# Patient Record
Sex: Female | Born: 1990 | Race: Black or African American | Hispanic: No | Marital: Single | Smoking: Former smoker
Health system: Southern US, Community
[De-identification: ages and names within clinical notes are randomized; demographics above are authoritative.]

## PROBLEM LIST (undated history)

## (undated) ENCOUNTER — Inpatient Hospital Stay: Payer: Self-pay

## (undated) ENCOUNTER — Inpatient Hospital Stay (HOSPITAL_COMMUNITY): Payer: Self-pay

## (undated) DIAGNOSIS — O039 Complete or unspecified spontaneous abortion without complication: Secondary | ICD-10-CM

## (undated) DIAGNOSIS — Z789 Other specified health status: Secondary | ICD-10-CM

## (undated) HISTORY — PX: DILATATION & CURETTAGE/HYSTEROSCOPY WITH MYOSURE: SHX6511

## (undated) HISTORY — DX: Other specified health status: Z78.9

---
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
## 2010-07-14 ENCOUNTER — Emergency Department: Payer: Self-pay | Admitting: Emergency Medicine

## 2010-08-20 ENCOUNTER — Observation Stay: Payer: Self-pay | Admitting: Obstetrics and Gynecology

## 2010-09-14 ENCOUNTER — Inpatient Hospital Stay: Payer: Self-pay | Admitting: Obstetrics and Gynecology

## 2010-09-17 ENCOUNTER — Observation Stay: Payer: Self-pay

## 2010-10-08 ENCOUNTER — Observation Stay: Payer: Self-pay | Admitting: Obstetrics & Gynecology

## 2010-10-16 ENCOUNTER — Observation Stay: Payer: Self-pay

## 2010-10-19 ENCOUNTER — Inpatient Hospital Stay: Payer: Self-pay

## 2011-11-14 ENCOUNTER — Emergency Department: Payer: Self-pay | Admitting: *Deleted

## 2011-11-14 LAB — URINALYSIS, COMPLETE
Ketone: NEGATIVE
Nitrite: POSITIVE
Ph: 6 (ref 4.5–8.0)
Specific Gravity: 1.027 (ref 1.003–1.030)
Squamous Epithelial: 2
WBC UR: 15 /HPF (ref 0–5)

## 2011-11-14 LAB — COMPREHENSIVE METABOLIC PANEL
Albumin: 4.5 g/dL (ref 3.4–5.0)
Alkaline Phosphatase: 58 U/L (ref 50–136)
Calcium, Total: 9.6 mg/dL (ref 8.5–10.1)
Co2: 23 mmol/L (ref 21–32)
Creatinine: 0.9 mg/dL (ref 0.60–1.30)
EGFR (Non-African Amer.): >60
Glucose: 83 mg/dL (ref 65–99)
SGOT(AST): 19 U/L (ref 15–37)
SGPT (ALT): 21 U/L

## 2011-11-14 LAB — CBC
HGB: 12.8 g/dL (ref 12.0–16.0)
MCH: 30 pg (ref 26.0–34.0)
RBC: 4.28 10*6/uL (ref 3.80–5.20)
WBC: 5.6 10*3/uL (ref 3.6–11.0)

## 2011-11-14 LAB — LIPASE, BLOOD: Lipase: 101 U/L (ref 73–393)

## 2011-11-16 LAB — URINE CULTURE

## 2011-12-20 ENCOUNTER — Emergency Department: Payer: Self-pay | Admitting: Internal Medicine

## 2011-12-22 ENCOUNTER — Emergency Department: Payer: Self-pay | Admitting: Emergency Medicine

## 2011-12-22 LAB — URINALYSIS, COMPLETE
Bilirubin,UR: NEGATIVE
Leukocyte Esterase: NEGATIVE
Ph: 6 (ref 4.5–8.0)
Protein: NEGATIVE
RBC,UR: 1 /HPF (ref 0–5)
WBC UR: 4 /HPF (ref 0–5)

## 2011-12-22 LAB — BASIC METABOLIC PANEL
Calcium, Total: 9.2 mg/dL (ref 8.5–10.1)
Co2: 25 mmol/L (ref 21–32)
Creatinine: 0.77 mg/dL (ref 0.60–1.30)
Potassium: 3.8 mmol/L (ref 3.5–5.1)
Sodium: 141 mmol/L (ref 136–145)

## 2011-12-22 LAB — CBC WITH DIFFERENTIAL/PLATELET
Basophil %: 0.4 %
Eosinophil #: 0.1 10*3/uL (ref 0.0–0.7)
Eosinophil %: 2.1 %
HCT: 39.7 % (ref 35.0–47.0)
HGB: 13.4 g/dL (ref 12.0–16.0)
Lymphocyte #: 2.3 10*3/uL (ref 1.0–3.6)
Lymphocyte %: 48.8 %
MCHC: 33.9 g/dL (ref 32.0–36.0)
Monocyte %: 5.6 %
Neutrophil #: 2 10*3/uL (ref 1.4–6.5)
Neutrophil %: 43.1 %
RBC: 4.49 10*6/uL (ref 3.80–5.20)
WBC: 4.7 10*3/uL (ref 3.6–11.0)

## 2011-12-22 LAB — WET PREP, GENITAL

## 2012-04-14 ENCOUNTER — Observation Stay: Payer: Self-pay | Admitting: Obstetrics and Gynecology

## 2012-04-14 LAB — CBC WITH DIFFERENTIAL/PLATELET
Basophil #: 0 10*3/uL (ref 0.0–0.1)
Eosinophil #: 0.3 10*3/uL (ref 0.0–0.7)
HCT: 31.6 % — ABNORMAL LOW (ref 35.0–47.0)
Lymphocyte %: 33.3 %
MCH: 27.7 pg (ref 26.0–34.0)
MCHC: 33.8 g/dL (ref 32.0–36.0)
Monocyte %: 6.9 %
Neutrophil #: 3.4 10*3/uL (ref 1.4–6.5)
Neutrophil %: 54.9 %
RDW: 14.8 % — ABNORMAL HIGH (ref 11.5–14.5)
WBC: 6.2 10*3/uL (ref 3.6–11.0)

## 2012-04-14 LAB — HCG, QUANTITATIVE, PREGNANCY: Beta Hcg, Quant.: <1 m[IU]/mL — ABNORMAL LOW

## 2012-07-20 ENCOUNTER — Emergency Department: Payer: Self-pay | Admitting: Emergency Medicine

## 2012-07-20 LAB — URINALYSIS, COMPLETE
Bilirubin,UR: NEGATIVE
Glucose,UR: NEGATIVE mg/dL
Ketone: NEGATIVE
Nitrite: NEGATIVE
Ph: 8
Protein: NEGATIVE
RBC,UR: 7 /HPF
Specific Gravity: 1.023
Squamous Epithelial: 5
WBC UR: 36 /HPF

## 2012-07-20 LAB — COMPREHENSIVE METABOLIC PANEL
Alkaline Phosphatase: 93 U/L (ref 50–136)
Anion Gap: 11 (ref 7–16)
Bilirubin,Total: 0.6 mg/dL (ref 0.2–1.0)
Calcium, Total: 9.7 mg/dL (ref 8.5–10.1)
Chloride: 107 mmol/L (ref 98–107)
Co2: 25 mmol/L (ref 21–32)
Creatinine: 0.76 mg/dL (ref 0.60–1.30)
EGFR (African American): >60
EGFR (Non-African Amer.): >60
Osmolality: 287 (ref 275–301)
SGPT (ALT): 17 U/L (ref 12–78)
Sodium: 143 mmol/L (ref 136–145)
Total Protein: 8.7 g/dL — ABNORMAL HIGH (ref 6.4–8.2)

## 2012-07-21 ENCOUNTER — Emergency Department: Payer: Self-pay | Admitting: Emergency Medicine

## 2012-07-21 LAB — COMPREHENSIVE METABOLIC PANEL
Albumin: 4.2 g/dL (ref 3.4–5.0)
Alkaline Phosphatase: 84 U/L (ref 50–136)
BUN: 15 mg/dL (ref 7–18)
Bilirubin,Total: 0.7 mg/dL (ref 0.2–1.0)
Creatinine: 0.88 mg/dL (ref 0.60–1.30)
EGFR (Non-African Amer.): >60
Glucose: 98 mg/dL (ref 65–99)
Osmolality: 280 (ref 275–301)
SGPT (ALT): 18 U/L (ref 12–78)
Sodium: 140 mmol/L (ref 136–145)
Total Protein: 8.5 g/dL — ABNORMAL HIGH (ref 6.4–8.2)

## 2012-07-21 LAB — CBC
HCT: 34.7 % — ABNORMAL LOW (ref 35.0–47.0)
HGB: 11.7 g/dL — ABNORMAL LOW (ref 12.0–16.0)
MCH: 25.4 pg — ABNORMAL LOW (ref 26.0–34.0)
MCHC: 33.7 g/dL (ref 32.0–36.0)
MCV: 75 fL — ABNORMAL LOW (ref 80–100)
Platelet: 194 10*3/uL (ref 150–440)
RDW: 19.7 % — ABNORMAL HIGH (ref 11.5–14.5)
WBC: 6.1 10*3/uL (ref 3.6–11.0)

## 2012-10-15 ENCOUNTER — Inpatient Hospital Stay: Payer: Self-pay | Admitting: Internal Medicine

## 2012-10-15 LAB — COMPREHENSIVE METABOLIC PANEL
Albumin: 4.6 g/dL (ref 3.4–5.0)
Alkaline Phosphatase: 111 U/L (ref 50–136)
BUN: 15 mg/dL (ref 7–18)
Bilirubin,Total: 0.7 mg/dL (ref 0.2–1.0)
Chloride: 105 mmol/L (ref 98–107)
Co2: 25 mmol/L (ref 21–32)
EGFR (Non-African Amer.): >60
Glucose: 91 mg/dL (ref 65–99)
Potassium: 3.7 mmol/L (ref 3.5–5.1)
SGOT(AST): 17 U/L (ref 15–37)
SGPT (ALT): 20 U/L (ref 12–78)

## 2012-10-15 LAB — CBC
MCH: 27.7 pg (ref 26.0–34.0)
MCHC: 32.3 g/dL (ref 32.0–36.0)
MCV: 86 fL (ref 80–100)
RBC: 4.86 10*6/uL (ref 3.80–5.20)
RDW: 14.8 % — ABNORMAL HIGH (ref 11.5–14.5)

## 2012-10-15 LAB — URINALYSIS, COMPLETE
Bilirubin,UR: NEGATIVE
Ketone: NEGATIVE
Nitrite: NEGATIVE
Ph: 5 (ref 4.5–8.0)
Specific Gravity: 1.029 (ref 1.003–1.030)
WBC UR: 10 /HPF (ref 0–5)

## 2012-10-15 LAB — WET PREP, GENITAL

## 2012-10-16 LAB — CBC WITH DIFFERENTIAL/PLATELET
HCT: 29.7 % — ABNORMAL LOW (ref 35.0–47.0)
HGB: 9.9 g/dL — ABNORMAL LOW (ref 12.0–16.0)
MCH: 28.9 pg (ref 26.0–34.0)
MCHC: 33.4 g/dL (ref 32.0–36.0)
MCV: 86 fL (ref 80–100)
Neutrophil %: 23.1 %
RDW: 14.3 % (ref 11.5–14.5)

## 2012-10-16 LAB — URINE CULTURE

## 2012-10-16 LAB — BASIC METABOLIC PANEL
Anion Gap: 6 — ABNORMAL LOW (ref 7–16)
Chloride: 110 mmol/L — ABNORMAL HIGH (ref 98–107)
Co2: 24 mmol/L (ref 21–32)
EGFR (Non-African Amer.): >60
Osmolality: 278 (ref 275–301)
Sodium: 140 mmol/L (ref 136–145)

## 2012-10-16 LAB — MAGNESIUM: Magnesium: 1.7 mg/dL — ABNORMAL LOW

## 2012-10-17 LAB — CBC WITH DIFFERENTIAL/PLATELET
Basophil #: 0 10*3/uL (ref 0.0–0.1)
Basophil %: 0.3 %
Eosinophil #: 0.1 10*3/uL (ref 0.0–0.7)
Eosinophil %: 4 %
HCT: 31 % — ABNORMAL LOW (ref 35.0–47.0)
HGB: 10.5 g/dL — ABNORMAL LOW (ref 12.0–16.0)
MCH: 28.9 pg (ref 26.0–34.0)
MCHC: 34 g/dL (ref 32.0–36.0)
MCV: 85 fL (ref 80–100)
Neutrophil #: 1.4 10*3/uL (ref 1.4–6.5)
Neutrophil %: 44.9 %
Platelet: 163 10*3/uL (ref 150–440)
RBC: 3.65 10*6/uL — ABNORMAL LOW (ref 3.80–5.20)
RDW: 14 % (ref 11.5–14.5)

## 2012-10-17 LAB — URINALYSIS, COMPLETE
Bacteria: NONE SEEN
Glucose,UR: NEGATIVE mg/dL (ref 0–75)
Ph: 6 (ref 4.5–8.0)
Protein: NEGATIVE
RBC,UR: <1 /HPF (ref 0–5)

## 2012-10-17 LAB — LIPASE, BLOOD: Lipase: 103 U/L (ref 73–393)

## 2013-01-05 LAB — COMPREHENSIVE METABOLIC PANEL
Anion Gap: 3 — ABNORMAL LOW (ref 7–16)
Chloride: 106 mmol/L (ref 98–107)
Co2: 27 mmol/L (ref 21–32)
EGFR (African American): >60
Glucose: 74 mg/dL (ref 65–99)
Osmolality: 271 (ref 275–301)
SGPT (ALT): 18 U/L (ref 12–78)
Total Protein: 8.7 g/dL — ABNORMAL HIGH (ref 6.4–8.2)

## 2013-01-05 LAB — CBC
HCT: 37.1 % (ref 35.0–47.0)
MCH: 29.3 pg (ref 26.0–34.0)
MCV: 88 fL (ref 80–100)
Platelet: 256 10*3/uL (ref 150–440)
WBC: 6.5 10*3/uL (ref 3.6–11.0)

## 2013-01-05 LAB — HCG, QUANTITATIVE, PREGNANCY: Beta Hcg, Quant.: <1 m[IU]/mL — ABNORMAL LOW

## 2013-01-06 ENCOUNTER — Inpatient Hospital Stay: Payer: Self-pay | Admitting: Internal Medicine

## 2013-01-06 LAB — URINALYSIS, COMPLETE
Blood: NEGATIVE
Glucose,UR: NEGATIVE mg/dL (ref 0–75)
Nitrite: NEGATIVE
Protein: 30
RBC,UR: 2 /HPF (ref 0–5)
Specific Gravity: 1.029 (ref 1.003–1.030)
Squamous Epithelial: 7
WBC UR: 6 /HPF (ref 0–5)

## 2013-01-06 LAB — DRUG SCREEN, URINE
Amphetamines, Ur Screen: NEGATIVE (ref ?–1000)
Barbiturates, Ur Screen: NEGATIVE (ref ?–200)
Cannabinoid 50 Ng, Ur ~~LOC~~: POSITIVE (ref ?–50)
Cocaine Metabolite,Ur ~~LOC~~: NEGATIVE (ref ?–300)
MDMA (Ecstasy)Ur Screen: NEGATIVE (ref ?–500)
Phencyclidine (PCP) Ur S: NEGATIVE (ref ?–25)
Tricyclic, Ur Screen: NEGATIVE (ref ?–1000)

## 2013-01-06 LAB — LIPASE, BLOOD: Lipase: 74 U/L (ref 73–393)

## 2013-01-07 LAB — BASIC METABOLIC PANEL
Calcium, Total: 8 mg/dL — ABNORMAL LOW (ref 8.5–10.1)
Chloride: 108 mmol/L — ABNORMAL HIGH (ref 98–107)
Co2: 25 mmol/L (ref 21–32)
Creatinine: 0.87 mg/dL (ref 0.60–1.30)
EGFR (African American): >60
EGFR (Non-African Amer.): >60
Glucose: 82 mg/dL (ref 65–99)
Osmolality: 275 (ref 275–301)
Sodium: 138 mmol/L (ref 136–145)

## 2013-01-08 LAB — DRUG SCREEN, URINE

## 2013-05-30 ENCOUNTER — Emergency Department: Payer: Self-pay | Admitting: Emergency Medicine

## 2013-05-30 LAB — COMPREHENSIVE METABOLIC PANEL
Albumin: 4.3 g/dL (ref 3.4–5.0)
Alkaline Phosphatase: 93 U/L (ref 50–136)
Bilirubin,Total: 0.3 mg/dL (ref 0.2–1.0)
Co2: 27 mmol/L (ref 21–32)
EGFR (Non-African Amer.): >60
Glucose: 90 mg/dL (ref 65–99)
Osmolality: 274 (ref 275–301)
SGPT (ALT): 20 U/L (ref 12–78)
Sodium: 137 mmol/L (ref 136–145)
Total Protein: 9.2 g/dL — ABNORMAL HIGH (ref 6.4–8.2)

## 2013-05-30 LAB — CBC
HCT: 35.4 % (ref 35.0–47.0)
MCH: 29.2 pg (ref 26.0–34.0)
MCHC: 33.6 g/dL (ref 32.0–36.0)
MCV: 87 fL (ref 80–100)
Platelet: 234 10*3/uL (ref 150–440)
RBC: 4.08 10*6/uL (ref 3.80–5.20)

## 2013-05-30 LAB — URINALYSIS, COMPLETE
Blood: NEGATIVE
Ketone: NEGATIVE
Ph: 8 (ref 4.5–8.0)
Protein: NEGATIVE
RBC,UR: 1 /HPF (ref 0–5)

## 2013-05-30 LAB — HCG, QUANTITATIVE, PREGNANCY: Beta Hcg, Quant.: <1 m[IU]/mL — ABNORMAL LOW

## 2013-05-30 LAB — LIPASE, BLOOD: Lipase: 138 U/L (ref 73–393)

## 2013-10-12 ENCOUNTER — Observation Stay: Payer: Self-pay

## 2013-11-09 ENCOUNTER — Observation Stay: Payer: Self-pay | Admitting: Obstetrics and Gynecology

## 2013-11-09 LAB — CBC WITH DIFFERENTIAL/PLATELET
BASOS ABS: 0 10*3/uL (ref 0.0–0.1)
BASOS PCT: 0.4 %
EOS ABS: 0 10*3/uL (ref 0.0–0.7)
Eosinophil %: 0.2 %
HCT: 36.4 % (ref 35.0–47.0)
HGB: 12.6 g/dL (ref 12.0–16.0)
LYMPHS ABS: 1.7 10*3/uL (ref 1.0–3.6)
Lymphocyte %: 23.2 %
MCH: 29.9 pg (ref 26.0–34.0)
MCHC: 34.5 g/dL (ref 32.0–36.0)
MCV: 87 fL (ref 80–100)
MONO ABS: 0.4 x10 3/mm (ref 0.2–0.9)
Monocyte %: 5.6 %
Neutrophil #: 5.1 10*3/uL (ref 1.4–6.5)
Neutrophil %: 70.6 %
Platelet: 217 10*3/uL (ref 150–440)
RBC: 4.2 10*6/uL (ref 3.80–5.20)
RDW: 14.1 % (ref 11.5–14.5)
WBC: 7.3 10*3/uL (ref 3.6–11.0)

## 2013-11-09 LAB — DRUG SCREEN, URINE
AMPHETAMINES, UR SCREEN: NEGATIVE (ref ?–1000)
BARBITURATES, UR SCREEN: NEGATIVE (ref ?–200)
BENZODIAZEPINE, UR SCRN: NEGATIVE (ref ?–200)
CANNABINOID 50 NG, UR ~~LOC~~: NEGATIVE (ref ?–50)
Cocaine Metabolite,Ur ~~LOC~~: NEGATIVE (ref ?–300)
MDMA (ECSTASY) UR SCREEN: NEGATIVE (ref ?–500)
Methadone, Ur Screen: NEGATIVE (ref ?–300)
OPIATE, UR SCREEN: POSITIVE (ref ?–300)
PHENCYCLIDINE (PCP) UR S: NEGATIVE (ref ?–25)
TRICYCLIC, UR SCREEN: NEGATIVE (ref ?–1000)

## 2013-11-09 LAB — URINALYSIS, COMPLETE
BILIRUBIN, UR: NEGATIVE
Blood: NEGATIVE
GLUCOSE, UR: NEGATIVE mg/dL (ref 0–75)
Leukocyte Esterase: NEGATIVE
Nitrite: NEGATIVE
Ph: 6 (ref 4.5–8.0)
Protein: 100
RBC, UR: NONE SEEN /HPF (ref 0–5)
Specific Gravity: 1.031 (ref 1.003–1.030)

## 2013-11-09 LAB — BASIC METABOLIC PANEL
Anion Gap: 5 — ABNORMAL LOW (ref 7–16)
BUN: 12 mg/dL (ref 7–18)
CHLORIDE: 105 mmol/L (ref 98–107)
CO2: 24 mmol/L (ref 21–32)
CREATININE: 0.75 mg/dL (ref 0.60–1.30)
Calcium, Total: 9.6 mg/dL (ref 8.5–10.1)
EGFR (African American): >60
Glucose: 100 mg/dL — ABNORMAL HIGH (ref 65–99)
Osmolality: 268 (ref 275–301)
Potassium: 3.2 mmol/L — ABNORMAL LOW (ref 3.5–5.1)
SODIUM: 134 mmol/L — AB (ref 136–145)

## 2013-11-09 LAB — GC/CHLAMYDIA PROBE AMP

## 2013-11-09 LAB — HCG, QUANTITATIVE, PREGNANCY: Beta Hcg, Quant.: 48637 m[IU]/mL — ABNORMAL HIGH

## 2013-11-09 LAB — WET PREP, GENITAL

## 2013-11-09 LAB — LITHIUM LEVEL: Lithium: <0.2 mmol/L — ABNORMAL LOW

## 2013-11-09 LAB — PREGNANCY, URINE: Pregnancy Test, Urine: POSITIVE m[IU]/mL

## 2013-11-10 LAB — LIPASE, BLOOD: LIPASE: 132 U/L (ref 73–393)

## 2013-11-10 LAB — BASIC METABOLIC PANEL
Anion Gap: 6 — ABNORMAL LOW (ref 7–16)
BUN: 11 mg/dL (ref 7–18)
Calcium, Total: 8.3 mg/dL — ABNORMAL LOW (ref 8.5–10.1)
Chloride: 108 mmol/L — ABNORMAL HIGH (ref 98–107)
Co2: 22 mmol/L (ref 21–32)
Creatinine: 0.63 mg/dL (ref 0.60–1.30)
EGFR (African American): >60
GLUCOSE: 73 mg/dL (ref 65–99)
Osmolality: 270 (ref 275–301)
Potassium: 3.1 mmol/L — ABNORMAL LOW (ref 3.5–5.1)
Sodium: 136 mmol/L (ref 136–145)

## 2013-11-10 LAB — HEPATIC FUNCTION PANEL A (ARMC)
ALBUMIN: 3 g/dL — AB (ref 3.4–5.0)
Alkaline Phosphatase: 48 U/L
BILIRUBIN DIRECT: 0.2 mg/dL (ref 0.00–0.20)
Bilirubin,Total: 0.6 mg/dL (ref 0.2–1.0)
SGOT(AST): 16 U/L (ref 15–37)
SGPT (ALT): 13 U/L (ref 12–78)
Total Protein: 6.5 g/dL (ref 6.4–8.2)

## 2013-11-10 LAB — MAGNESIUM: Magnesium: 1.6 mg/dL — ABNORMAL LOW

## 2013-11-11 LAB — BASIC METABOLIC PANEL
Anion Gap: 5 — ABNORMAL LOW (ref 7–16)
BUN: 4 mg/dL — ABNORMAL LOW (ref 7–18)
Calcium, Total: 8.2 mg/dL — ABNORMAL LOW (ref 8.5–10.1)
Chloride: 107 mmol/L (ref 98–107)
Co2: 23 mmol/L (ref 21–32)
Creatinine: 0.56 mg/dL — ABNORMAL LOW (ref 0.60–1.30)
EGFR (African American): >60
Glucose: 95 mg/dL (ref 65–99)
OSMOLALITY: 267 (ref 275–301)
Potassium: 3.3 mmol/L — ABNORMAL LOW (ref 3.5–5.1)
SODIUM: 135 mmol/L — AB (ref 136–145)

## 2013-11-11 LAB — T4, FREE: FREE THYROXINE: 1.12 ng/dL (ref 0.76–1.46)

## 2013-11-11 LAB — TSH: Thyroid Stimulating Horm: 1.09 u[IU]/mL

## 2013-11-11 LAB — MAGNESIUM: Magnesium: 2 mg/dL

## 2013-11-15 LAB — URINE CULTURE

## 2013-12-16 LAB — BASIC METABOLIC PANEL
Anion Gap: 7 (ref 7–16)
BUN: 9 mg/dL (ref 7–18)
Calcium, Total: 7.9 mg/dL — ABNORMAL LOW (ref 8.5–10.1)
Chloride: 108 mmol/L — ABNORMAL HIGH (ref 98–107)
Co2: 22 mmol/L (ref 21–32)
Creatinine: 0.85 mg/dL (ref 0.60–1.30)
EGFR (Non-African Amer.): >60
Glucose: 117 mg/dL — ABNORMAL HIGH (ref 65–99)
OSMOLALITY: 274 (ref 275–301)
Potassium: 3.2 mmol/L — ABNORMAL LOW (ref 3.5–5.1)
Sodium: 137 mmol/L (ref 136–145)

## 2013-12-16 LAB — CBC
HCT: 26.2 % — AB (ref 35.0–47.0)
HGB: 8.7 g/dL — ABNORMAL LOW (ref 12.0–16.0)
MCH: 29.7 pg (ref 26.0–34.0)
MCHC: 33.1 g/dL (ref 32.0–36.0)
MCV: 90 fL (ref 80–100)
PLATELETS: 203 10*3/uL (ref 150–440)
RBC: 2.92 10*6/uL — ABNORMAL LOW (ref 3.80–5.20)
RDW: 14.5 % (ref 11.5–14.5)
WBC: 11.6 10*3/uL — ABNORMAL HIGH (ref 3.6–11.0)

## 2013-12-16 LAB — HCG, QUANTITATIVE, PREGNANCY: Beta Hcg, Quant.: 1611 m[IU]/mL — ABNORMAL HIGH

## 2013-12-17 ENCOUNTER — Ambulatory Visit: Payer: Self-pay | Admitting: Obstetrics and Gynecology

## 2013-12-17 LAB — HEMOGLOBIN: HGB: 6.2 g/dL — AB (ref 12.0–16.0)

## 2013-12-18 LAB — CBC WITH DIFFERENTIAL/PLATELET
Basophil #: 0 10*3/uL (ref 0.0–0.1)
Basophil %: 0.4 %
Eosinophil #: 0.1 10*3/uL (ref 0.0–0.7)
Eosinophil %: 1.2 %
HCT: 24.3 % — ABNORMAL LOW (ref 35.0–47.0)
HGB: 8.4 g/dL — ABNORMAL LOW (ref 12.0–16.0)
LYMPHS PCT: 34.4 %
Lymphocyte #: 3.4 10*3/uL (ref 1.0–3.6)
MCH: 30.1 pg (ref 26.0–34.0)
MCHC: 34.7 g/dL (ref 32.0–36.0)
MCV: 87 fL (ref 80–100)
MONOS PCT: 4.5 %
Monocyte #: 0.4 x10 3/mm (ref 0.2–0.9)
Neutrophil #: 6 10*3/uL (ref 1.4–6.5)
Neutrophil %: 59.5 %
PLATELETS: 125 10*3/uL — AB (ref 150–440)
RBC: 2.8 10*6/uL — ABNORMAL LOW (ref 3.80–5.20)
RDW: 15 % — AB (ref 11.5–14.5)
WBC: 10 10*3/uL (ref 3.6–11.0)

## 2013-12-19 LAB — PATHOLOGY REPORT

## 2013-12-24 ENCOUNTER — Emergency Department: Payer: Self-pay | Admitting: Emergency Medicine

## 2013-12-24 LAB — URINALYSIS, COMPLETE
BACTERIA: NONE SEEN
BLOOD: NEGATIVE
Bilirubin,UR: NEGATIVE
Glucose,UR: NEGATIVE mg/dL (ref 0–75)
Leukocyte Esterase: NEGATIVE
NITRITE: NEGATIVE
Ph: 7 (ref 4.5–8.0)
Protein: NEGATIVE
RBC, UR: NONE SEEN /HPF (ref 0–5)
Specific Gravity: 1.016 (ref 1.003–1.030)
Squamous Epithelial: 3
WBC UR: NONE SEEN /HPF (ref 0–5)

## 2013-12-24 LAB — COMPREHENSIVE METABOLIC PANEL
ALK PHOS: 57 U/L
Albumin: 3.7 g/dL (ref 3.4–5.0)
Anion Gap: 5 — ABNORMAL LOW (ref 7–16)
BILIRUBIN TOTAL: 0.3 mg/dL (ref 0.2–1.0)
BUN: 10 mg/dL (ref 7–18)
CALCIUM: 8.8 mg/dL (ref 8.5–10.1)
CHLORIDE: 106 mmol/L (ref 98–107)
CREATININE: 0.78 mg/dL (ref 0.60–1.30)
Co2: 26 mmol/L (ref 21–32)
EGFR (African American): >60
EGFR (Non-African Amer.): >60
Glucose: 86 mg/dL (ref 65–99)
OSMOLALITY: 272 (ref 275–301)
POTASSIUM: 3.7 mmol/L (ref 3.5–5.1)
SGOT(AST): 19 U/L (ref 15–37)
SGPT (ALT): 18 U/L (ref 12–78)
Sodium: 137 mmol/L (ref 136–145)
TOTAL PROTEIN: 7.9 g/dL (ref 6.4–8.2)

## 2013-12-24 LAB — HCG, QUANTITATIVE, PREGNANCY: BETA HCG, QUANT.: 35 m[IU]/mL — AB

## 2013-12-24 LAB — CBC
HCT: 29.6 % — AB (ref 35.0–47.0)
HGB: 10.1 g/dL — AB (ref 12.0–16.0)
MCH: 30.5 pg (ref 26.0–34.0)
MCHC: 34.1 g/dL (ref 32.0–36.0)
MCV: 90 fL (ref 80–100)
PLATELETS: 304 10*3/uL (ref 150–440)
RBC: 3.3 10*6/uL — ABNORMAL LOW (ref 3.80–5.20)
RDW: 14.4 % (ref 11.5–14.5)
WBC: 7.4 10*3/uL (ref 3.6–11.0)

## 2013-12-24 LAB — WET PREP, GENITAL

## 2013-12-24 LAB — GC/CHLAMYDIA PROBE AMP

## 2014-11-07 ENCOUNTER — Emergency Department: Payer: Self-pay | Admitting: Emergency Medicine

## 2014-11-07 LAB — CBC
HCT: 36.1 % (ref 35.0–47.0)
HGB: 12 g/dL (ref 12.0–16.0)
MCH: 29.2 pg (ref 26.0–34.0)
MCHC: 33.3 g/dL (ref 32.0–36.0)
MCV: 88 fL (ref 80–100)
PLATELETS: 171 10*3/uL (ref 150–440)
RBC: 4.12 10*6/uL (ref 3.80–5.20)
RDW: 14.6 % — ABNORMAL HIGH (ref 11.5–14.5)
WBC: 5 10*3/uL (ref 3.6–11.0)

## 2014-11-07 LAB — HCG, QUANTITATIVE, PREGNANCY: Beta Hcg, Quant.: <1 m[IU]/mL — ABNORMAL LOW

## 2014-12-12 ENCOUNTER — Emergency Department: Payer: Self-pay | Admitting: Student

## 2014-12-25 ENCOUNTER — Emergency Department: Payer: Self-pay | Admitting: Emergency Medicine

## 2015-01-06 IMAGING — US ABDOMEN ULTRASOUND LIMITED
1 series · 14 of 25 positions shown · non-contrast
Comparison: Prior ultrasound from 11/09/2013.

CLINICAL DATA: Abdominal pain

EXAM:
US ABDOMEN LIMITED - RIGHT UPPER QUADRANT

[Series 1: abdomen ultrasound limited · 0.20mm/px · 14 of 46 slices shown]
[im 1/46]
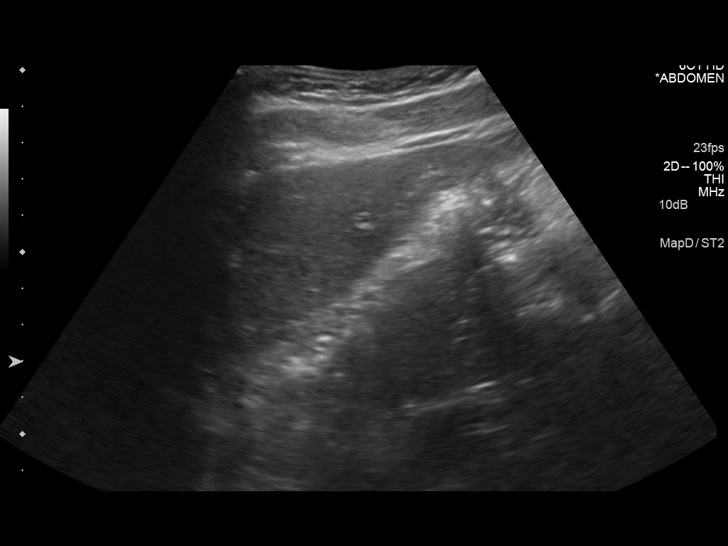
[im 4/46]
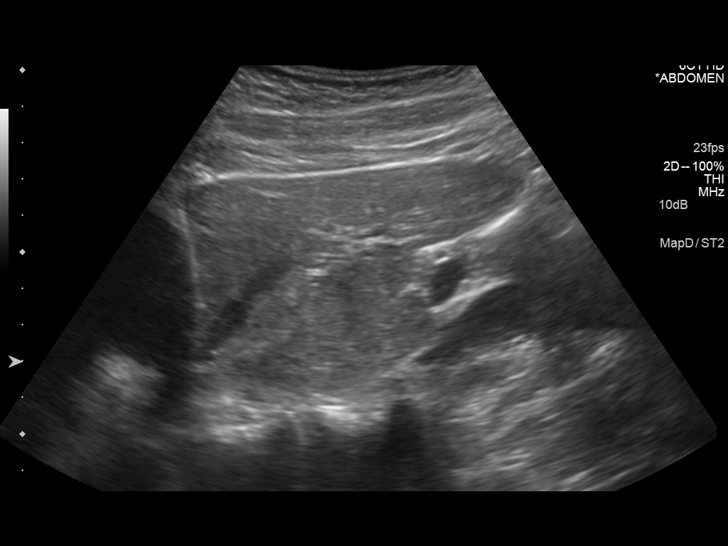
[im 8/46]
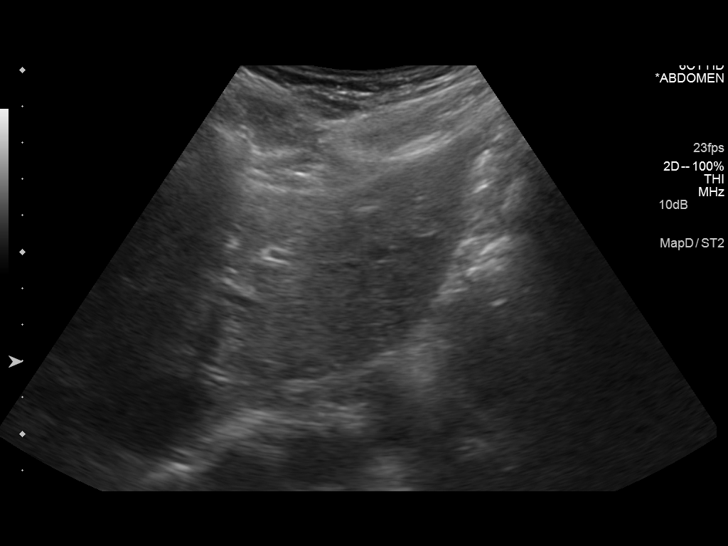
[im 12/46]
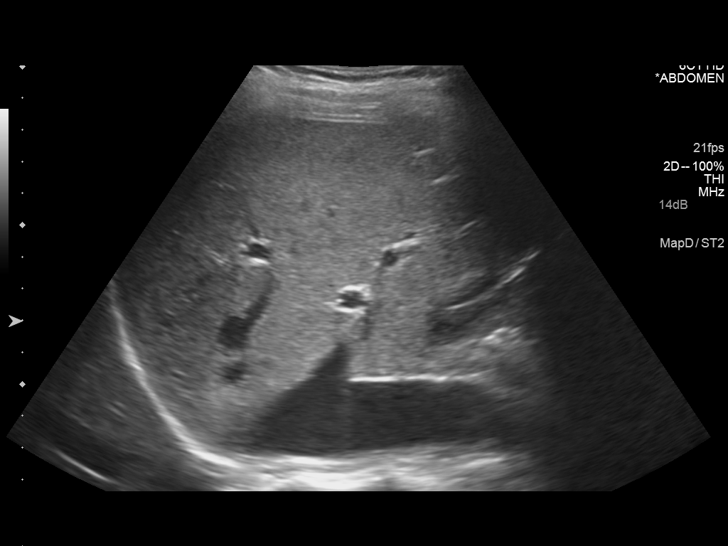
[im 16/46]
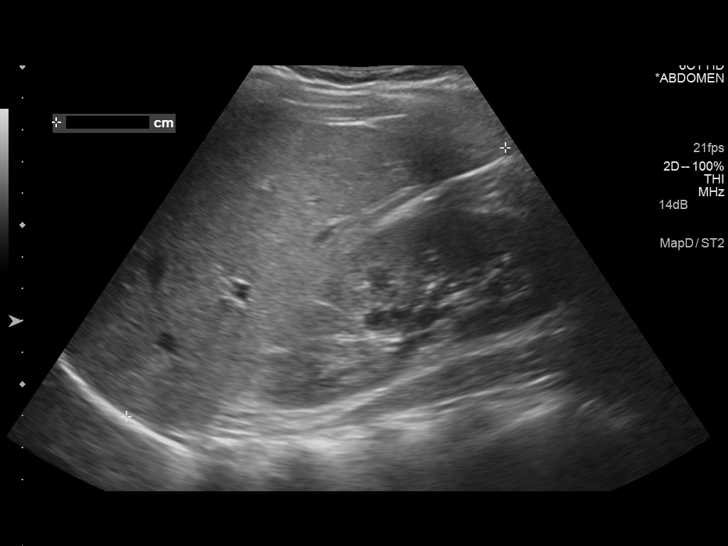
[im 17/46]
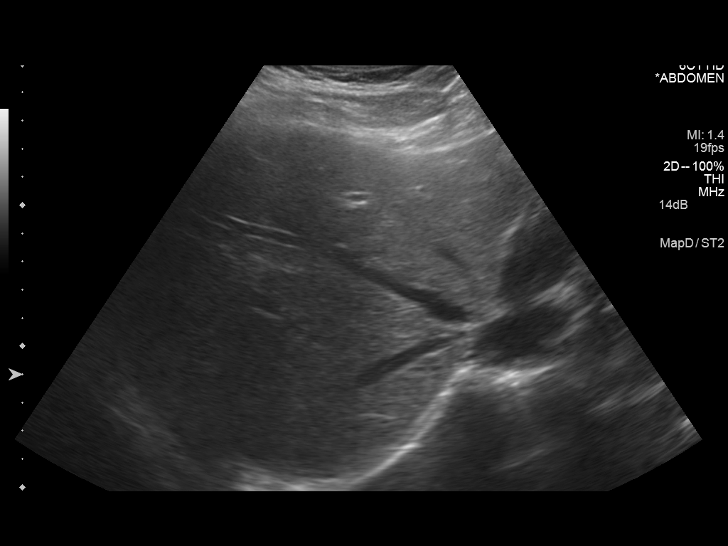
[im 21/46]
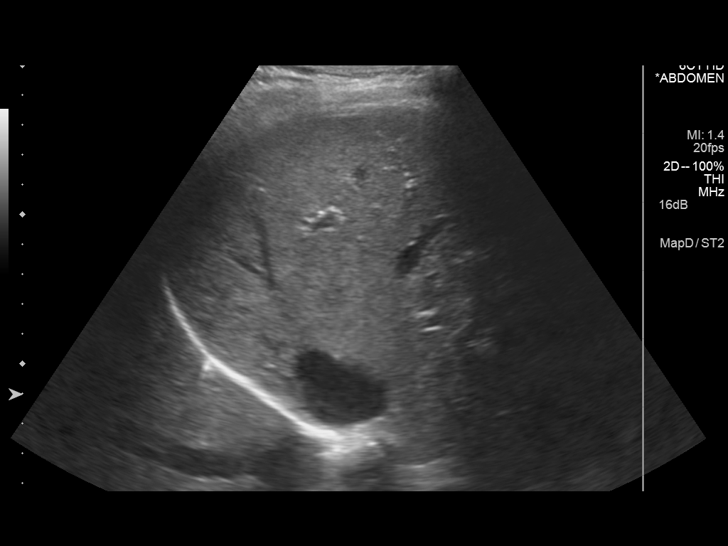
[im 25/46]
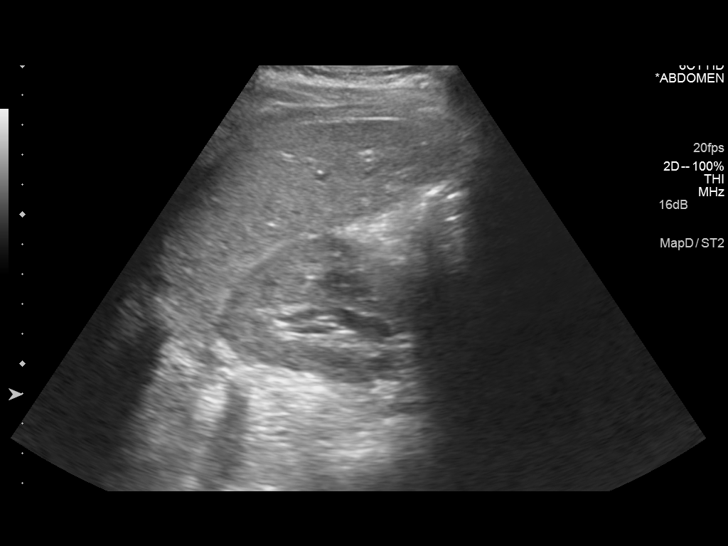
[im 29/46]
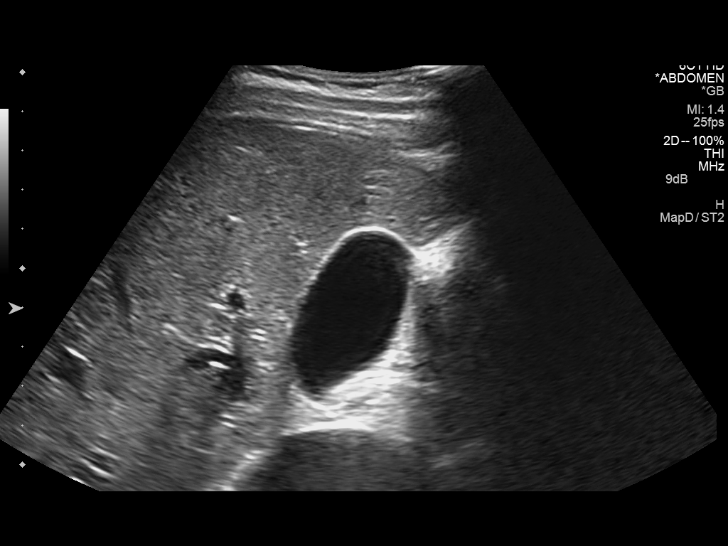
[im 31/46]
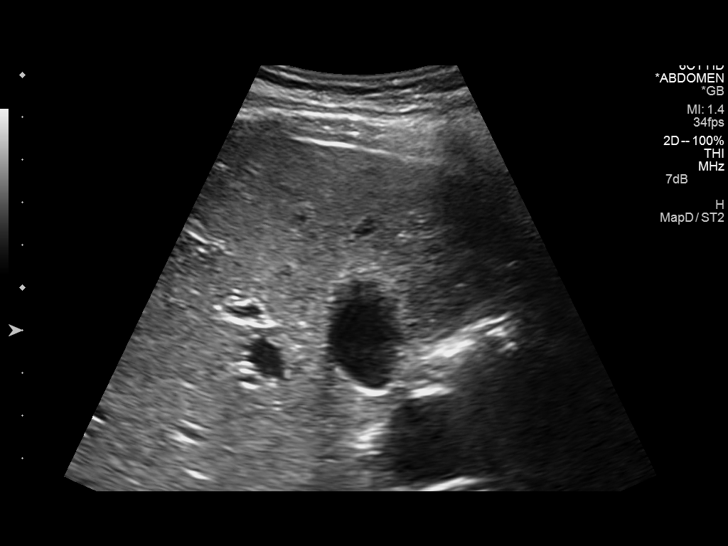
[im 34/46]
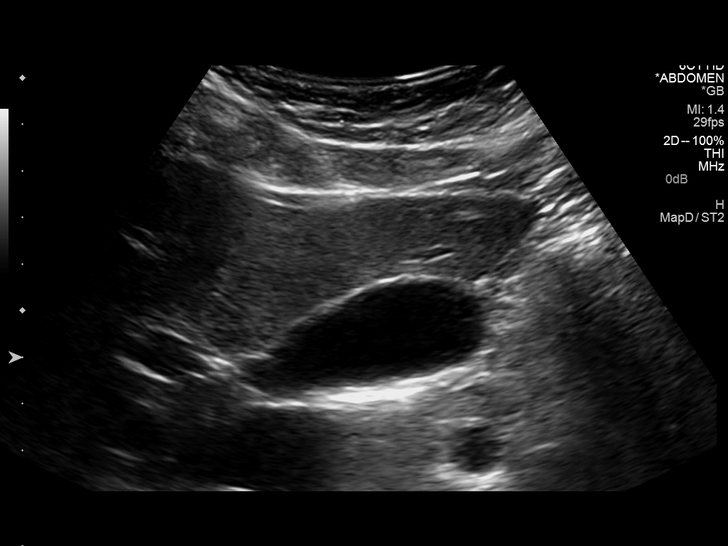
[im 38/46]
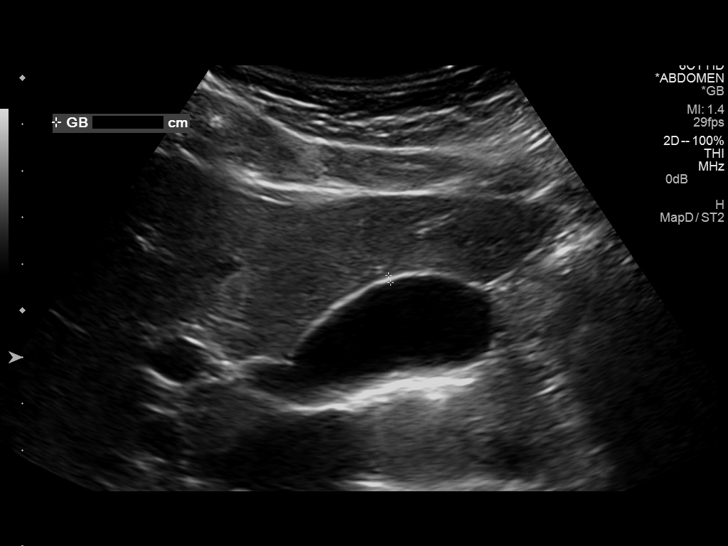
[im 42/46]
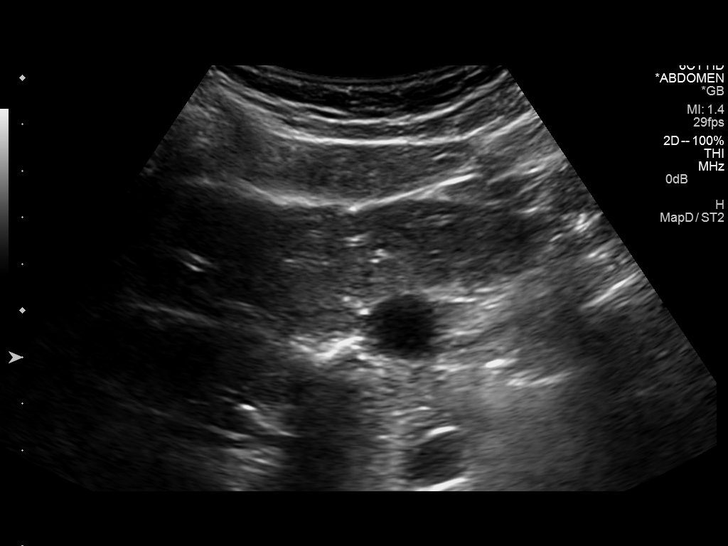
[im 46/46]
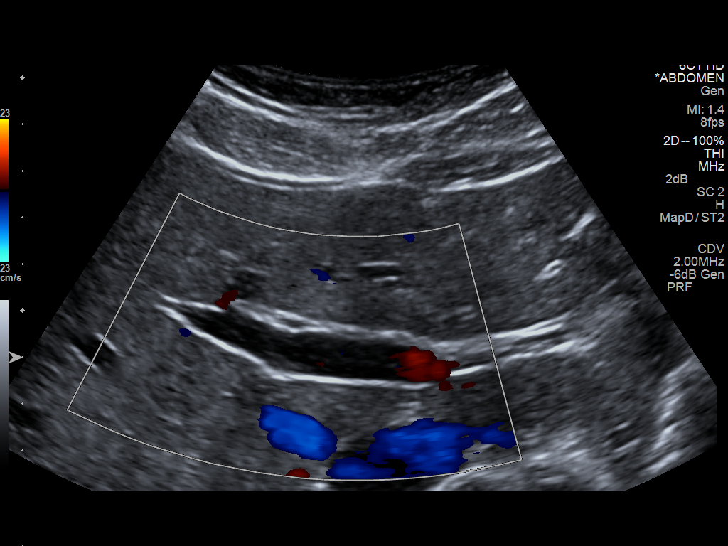

[14 of 25 positions shown; findings below may reference images not displayed]

FINDINGS: Gallbladder:

No gallstones or wall thickening visualized. No sonographic Murphy
sign noted. Gallbladder wall measured 1.6 mm.

Common bile duct:

Diameter: 3.1 mm.

Liver:

No focal lesion identified. Within normal limits in parenchymal
echogenicity.
IMPRESSION: No sonographic evidence of cholelithiasis, acute cholecystitis, or
biliary ductal dilatation.

## 2015-01-23 NOTE — Consult Note (Signed)
PATIENT NAME:  Natalie Riggs, Elim M MR#:  [id number] DATE OF BIRTH:  [birth year]  DATE OF CONSULTATION:  01/08/2013  REFERRING PHYSICIAN:       CONSULTING PHYSICIAN:  Cristal Deerhristopher A. Katlin Ciszewski, MD  REASON FOR CONSULTATION: Left lower quadrant pain.   HISTORY OF PRESENT ILLNESS:  The patient is a pleasant 24 year old female who I had seen earlier in January for left lower quadrant pain. She has had multiple episodes in the past, always on the left side. She now presents for 24 hours of nausea and vomiting that she has been having for the last 3 weeks on and off.  She denies having any sick contacts. No over-the-counter medicines. At that time in January when I had seen her, we thought it was viral gastroenteritis. Otherwise, no fevers, chills, night sweats, shortness of breath, cough, chest pain, diarrhea, constipation, dysuria or hematuria.   PAST MEDICAL HISTORY:    None.   PAST SURGICAL HISTORY:  Two vaginal deliveries.   ALLERGIES: No known drug allergies.   HOME MEDICATIONS:   None.   SOCIAL HISTORY: He drinks alcohol 2 to 3 times a week smokes 2 to 3 cigarettes a day. Lives with mother.  Positive urinalysis for cannabinoids.   FAMILY HISTORY: Mother with hypertension.   REVIEW OF SYSTEMS:  A 12-point review of systems was obtained. Pertinent positives and negatives as above.   PHYSICAL EXAMINATION: VITAL SIGNS: Temperature 97.8, pulse 55, blood pressure 100/52, respirations 18, 96% on room air.   GENERAL: No acute distress. Alert and oriented x 3.  HEAD: Normocephalic, atraumatic.   EYES: No scleral icterus. No conjunctivitis.  FACE: No obvious facial trauma.  NOSE:  Normal external nose.  EARS:  Normal external ears.  CHEST: Lungs clear to auscultation, moving air well.  HEART: Regular rate murmurs, rubs, or murmurs, rubs or gallops.  ABDOMEN: Soft, tender palpation left lower quadrant, otherwise significantly tender. No rebound.  EXTREMITIES: Moves all extremities well. Strength  5/5.  NEUROLOGIC:  Cranial nerves II through XII grossly intact.   LABORATORY AND DIAGNOSTIC DATA:  Significant for white cell count 6.5 on April 5th, hemoglobin and hematocrit, and platelets were all normal. Urinalysis shows trace leukocyte esterase, 6 white blood cells.  Renal panel is otherwise unremarkable. Urinalysis came back positive for cannabinoids.  CT scan shows perineal and pericholecystic fluid, more significant than previously.   ASSESSMENT AND PLAN:  The patient is a pleasant 24 year old female who presents with recurrent left lower quadrant pain. She has fluid in her pelvis according to Dr. Luberta MutterKonidena.   She and Dr. Register believe this is consistent with ruptured ovarian cyst. I see no obvious surgical etiologies or general surgery etiologies for this now. I will continue to evaluate.    ____________________________ Si Raiderhristopher A. Gerrell Tabet, MD cal:ct D: 01/08/2013 14:05:50 ET T: 01/08/2013 14:23:40 ET JOB#: [id number]  cc: Cristal Deerhristopher A. Jadence Kinlaw, MD, <Dictator> Jarvis NewcomerHRISTOPHER A Amil Moseman MD ELECTRONICALLY SIGNED 01/09/2013 16:21

## 2015-01-23 NOTE — Consult Note (Signed)
PATIENT NAME:  Natalie Riggs, [name] MR#:  [id number] DATE OF BIRTH:  1990/10/05  DATE OF CONSULTATION:  10/16/2012  REFERRING PHYSICIAN:   CONSULTING PHYSICIAN:  Cristal Deerhristopher A. Dejanee Thibeaux, MD  HISTORY OF PRESENT ILLNESS: The patient is a pleasant 24 year old female with no significant past medical history, who presents with a 3-day history of now intractable nausea and vomiting. She says that her pain began suddenly 3 days ago. It began with nausea and vomiting and diarrhea and then the pain followed afterward. She does not think the pain is due to retching. She also has had left flank pain, chronically, due to urinary tract infections. She says that this feels different; says she has had no fevers, chills, night sweats, shortness of breath, cough, chest pain, dysuria or hematuria. She has not been on antibiotics recently. No sick contacts, no unusual ingestions, no recent travel.   PAST MEDICAL HISTORY:  History of 2 vaginal deliveries and a miscarriage.    ALLERGIES: No known drug allergies.   HOME MEDICINES: No home medicines at home.   SOCIAL HISTORY: Lives at home with mother. No smoking, alcohol or drug use.   FAMILY HISTORY: Mother has hypertension. No cancer or coronary artery disease in the family.   REVIEW OF SYSTEMS: A 12-point review of systems was obtained. Pertinent positives and negatives as in the H and P.    PHYSICAL EXAMINATION: VITAL SIGNS: Temperature 98.6, pulse 64, blood pressure 100/59, respirations 20.  GENERAL: No acute distress, alert and oriented x 3.  HEAD: Normocephalic, atraumatic.  EYES: No scleral icterus. No conjunctivitis.  FACE: No obvious facial trauma. Normal external nose. Normal external ears. Normal external mouth.  NECK: No obvious neck masses visible.   CHEST: Lungs clear to auscultation, moving air well.  HEART: Regular rate and rhythm. No murmurs, rubs or gallops.  ABDOMEN: Soft, nondistended, tender to palpation left lower quadrant. No Rovsing's, no  psoas, no obturator, no rebound.  EXTREMITIES: Moves all extremities well. Strength 5/5.  NEUROLOGIC: Sensation normal in all 4 extremities. Cranial nerves II through XII grossly intact.   LABORATORY, DIAGNOSTIC AND RADIOLOGICAL DATA: I have personally reviewed labs. Significant for white cell count of 3.3, hemoglobin and hematocrit of 9.9 and 29.7,  platelets are 139. Differential is 23% neutrophils, 60% lymphocytes.   Creatinine is 0.94, potassium is 3.7, magnesium is 1.7.   I personally reviewed the CT scan and ultrasound. CT scan shows no obvious bowel obstruction, ileus; poor definition of the left adnexa with a complex density, which is normal on ultrasound. Ultrasound shows normal pelvic ultrasounds.   ASSESSMENT AND PLAN: The patient is a pleasant 24 year old female with a history of left lower quadrant pain, nausea, vomiting, diarrhea. I believe that her symptoms are consistent with gastroenteritis. We will continue to ensure she is improved, but no obvious surgical intervention at this time.       ____________________________ Si Raiderhristopher A. Adonys Wildes, MD cal:cc D: 10/16/2012 14:57:00 ET T: 10/16/2012 17:41:29 ET JOB#: [id number]  cc: Cristal Deerhristopher A. Skye Rodarte, MD, <Dictator> Jarvis NewcomerHRISTOPHER A Kimora Stankovic MD ELECTRONICALLY SIGNED 10/23/2012 9:07

## 2015-01-23 NOTE — Discharge Summary (Signed)
PATIENT NAME:  Natalie Riggs, [name] MR#:  [id number] DATE OF BIRTH:  06-09-1991  DATE OF ADMISSION:  10/15/2012 DATE OF DISCHARGE:  10/18/2012  PRIMARY CARE PHYSICIAN: None local.   DISCHARGE DIAGNOSES:  1.  Acute gastroenteritis.  2.  Anemia.  3.  Neutropenia.   CONDITION: Stable.   CODE STATUS: Full code.   MEDICATIONS: Cipro 500 mg p.o. b.i.d. for 3 days.   DIET: Regular diet.   ACTIVITY: As tolerated.   FOLLOWUP CARE: Follow up with PCP within 1 to 2 weeks   REASON FOR ADMISSION: Nausea, vomiting and diarrhea.  CONSULTATIONS: General surgery, Dr. Juliann PulseLundquist.   HOSPITAL COURSE: The patient is a 24 year old African American female with no past medical history, presented to the ED with 2 days of intractable nausea and vomiting and 1-day history of diarrhea. The patient did not eat anything. For detailed history and physical examination, please refer to the admission note dictated by Dr. Nemiah CommanderKalisetti on admission date. The patient's urinalysis is negative. Pregnancy test is negative. BUN 15, creatinine 0.8. Electrolytes are normal. CAT scan of the abdomen showed no evidence of bowel obstruction, ileus or objective signs of gastroenteritis or acute inflammation of bowel. Ultrasound showed no free fluid, normal pelvic ultrasound and normal echotexture of uterus. Please see the report for details. General surgeon, Dr. Juliann PulseLundquist evaluated the patient and suggested the patient has no signs of surgical indication. The patient's nausea, vomiting, diarrhea is possibly due to gastroenteritis. The patient has been treated with Cipro and Flagyl. Her symptoms are much improved today. She wants to eat. We started regular diet. The patient is clinically stable and will be discharged to home today. I discussed the patient's discharge plan with the patient and the case manager.   TIME SPENT: About 32 minutes.    ____________________________ Shaune PollackQing Mckala Pantaleon, MD qc:jm D: 10/18/2012 15:03:50 ET T: 10/18/2012  16:37:49 ET JOB#: [id number]  cc: Shaune PollackQing Johnpaul Gillentine, MD, <Dictator> Shaune PollackQING Elijiah Mickley MD ELECTRONICALLY SIGNED 10/19/2012 18:24

## 2015-01-23 NOTE — H&P (Signed)
PATIENT NAME:  Natalie Riggs, Natalie Riggs MR#:  [id number] DATE OF BIRTH:  04/06/1991  DATE OF ADMISSION:  01/06/2013  PRIMARY CARE PHYSICIAN: The patient states at [facility], the patient could not specify the name.  REFERRING PHYSICIAN:  Caleen Jobs. Braud, MD     CHIEF COMPLAINT: Nausea, vomiting.   HISTORY OF PRESENT ILLNESS: The patient is a 24 year old African American female with no past medical history, presented to the Emergency Department with complaints of nausea, vomiting multiple times for the last 24 hours. The patient states she has been having these symptoms for the last 3 weeks on and off; however, in the last 24 hours she could not keep down any food. She has been vomiting clear vomitus. She denies having any sick contacts, denies having any recent change of medication. The patient denies using any over-the-counter medication. The patient had a similar admission in January 2014.  At that time, the patient was also found to be neutropenic; however, the patient's symptoms resolved as well as improved WBC count, and she was discharged to follow up with the primary care physician. The patient denies having any fever, states some nonspecific abdominal pain. Work-up in the Emergency Department with CBC, CMP are completely within normal limits. Urinalysis is negative. Pelvic exam showed many bacteria, however, negative for any trichomonas.  A pelvic ultrasound was negative. Urine drug screen is positive for cannabinoids. The patient states he drinks alcohol 2 to 3 times a week.  In the Emergency Department, the patient was given Phenergan, Zofran, and Reglan without improvement. The patient continued to retch.  Concerning this, hospitalist services was called admission.   PAST MEDICAL HISTORY:  None.   PAST SURGICAL HISTORY:  Two vaginal deliveries and a miscarriage.    ALLERGIES: No known drug allergies.   HOME MEDICATIONS: None.   SOCIAL HISTORY: Smokes 2 to 3 cigarettes a day. Drinks alcohol  2 to 3 times a week.  Lives with her mother. Denied any drug use, however, cannabinoids are positive.  Currently not working.   FAMILY HISTORY: Mother with hypertension.   REVIEW OF SYSTEMS: CONSTITUTIONAL: Generalized weakness. No fever.  EYES: No change in vision. No discharge. EARS: No change in hearing.  RESPIRATORY: No cough, shortness of breath.  CARDIOVASCULAR: No chest pain, palpitations.  GENITOURINARY: No hematuria or dysuria. MUSCULOSKELETAL: Generalized body aches.  SKIN: No rash or lesions.  ENDOCRINE: No polyuria or polydipsia.  NEUROLOGICAL:  No weakness or numbness.  PSYCHIATRIC: No depression.   PHYSICAL EXAMINATION: GENERAL: This is well-built, well-nourished, age-appropriate female lying down in the bed, not in distress.  VITAL SIGNS: Temperature 98.7, pulse 88, blood pressure 120/69, respiratory rate of 18, oxygen saturations 96% on room air.  HEENT: Head normocephalic, atraumatic.  EYES: No scleral icterus. Conjunctivae normal. Pupils equal and react to light. Extraocular movements intact. Mucous membranes moist.  No pharyngeal erythema.  NECK: Supple. No lymphadenopathy. No JVD. No carotid bruit. No thyromegaly.  CHEST: Has no focal tenderness. LUNGS: Bilaterally clear to auscultation. No wheezes or rhonchi.  CARDIOVASCULAR:  Regular rate and rhythm. No murmurs are heard. Pulses are 2+ in both lower extremities. No pedal edema.  ABDOMEN: Bowel sounds present, soft, nontender, nondistended. No hepatosplenomegaly.  No CVA tenderness.  PELVIC: Not performed by me; however, done by the Emergency Department physician who stated that she has mild discharge.  MUSCULOSKELETAL: Good range of motion in all the extremities.   NEUROLOGIC: The patient is alert, oriented to place, person and time. Cranial nerves II through  XII are intact. Motor 5 out of 5 in upper and lower extremities. Sensory intact. Babinski downgoing.  SKIN: No rash or lesions.  LYMPHATIC: No cervical  axillary or inguinal lymphadenopathy.  PSYCHIATRIC: Slightly depressed mood.    concern about recurrent episodes of nausea.   LABORATORY AND RADIOLOGICAL DATA:  CMP is completely within normal limits. CBC: WBC of 6.5, hemoglobin 12.4, platelet count of 256.   UA negative for nitrites, has trace leukocyte esterase, WBC of 6, epithelial cells 7.0.  Beta HCG less than 1.0.  Urine drug screen is positive for cannabinoids. Pelvic exam shows many bacteria and clue cells are seen.   ASSESSMENT AND PLAN: The patient is a 24 year old female who comes to the Emergency Department for nausea vomiting of 1-day's duration.   1. Nausea and vomiting:  The patient consumes alcohol, states last alcohol use was 4 days back. The patient has urine drug screen that is positive for cannabinoids. These can cause nausea and vomiting. However, there is the other  possibility of viral etiology. The patient does not have any fever at this time. We will keep the patient n.p.o., continue with IV fluids, continue with providing antinausea medications as needed. When the patient has improved nausea, we will start the patient on a clear liquid diet and advance the diet as tolerated.  2. Bacterial vaginosis:  On pelvic exam, many bacteria and clue cells are seen.  I will treat with Flagyl for 5 days at the time of the discharge.  3. Alcohol use: Counseled with the patient; however, the patient is currently sleeping from antinausea medication.  4. Deep vein thrombosis prophylaxis: With Lovenox.   TIME SPENT:   40 minutes.  ____________________________ Susa GriffinsPadmaja Emilina Smarr, MD pv:cb D: 01/06/2013 02:53:23 ET T: 01/06/2013 15:01:08 ET JOB#: [id number] cc: Susa GriffinsPadmaja Barbara Keng, MD, <Dictator> Susa GriffinsPADMAJA Lurae Hornbrook MD ELECTRONICALLY SIGNED 01/08/2013 6:33

## 2015-01-23 NOTE — Discharge Summary (Signed)
PATIENT NAME:  Natalie Riggs, [name] MR#:  [id number] DATE OF BIRTH:  Apr 15, 1991  DATE OF ADMISSION:  01/06/2013 DATE OF DISCHARGE:  01/08/2013- AMA  HOSPITAL COURSE:  This is a 24 year old African American female with complex medical history, came because of  abdominal pain, nausea, vomiting, the patient unable to take anything by mouth. Admitted for intractable nausea, vomiting with poor p.o. intake. nt  on admission. Urine toxicology positive for cannabinoids and the patient had a pelvic ultrasound on admission and ultrasound of the pelvis showed normal pelvic ultrasound. The patient was kept n.p.o., started on IV fluids, along with Zofran and IV morphine.  The patient's  pelvic exam showed clue cells, so  she received  rocephin,Flagyl in the Emergency Room,  she is monitord on the floor, continued IV fluids along with IV Phenergan for nausea(  , but the patient continued to have persistent left lateral quadrant pain with nausea,so she has ct  abdomen /pelvis.> abdomen and pelvis yesterday. CT of  the abdomen and pelvis show, Kidneys normal. Aorta normal  caliber. Appendix nondistended. Moderate amount of free pelvic fluid  noted. Right adnexal mass cannot be excluded. Pregnancy test suggested to  exclude ectopic pregnancy. If the patient is pregnant pelvic ultrasound  suggested for further evaluation. There is no evidence of bowel   obstruction. Mild amount of fluid is noted about the gallbladder in the  gallbladder fossa . This may be related to the free pelvic fluid. Mild  ascites is present. Mild biliary distention. No obstructing lesion noted.  No free air.  (  I reviewed the records old , radiology reports,spoke with Dr.Regester , who compared the CAT results  from January and he suggested that the patient likely had gallbladder disease recommended  gallbladder ultrasound along with GYN consult for possible right ovary disease, including rupture, but  patient wanted to go home. The patient explained  the findings and I spoke with Dr. Sharmon RevereLindquist from surgery to see the patient and also OB/GYN Dr. Greggory KeeneFrancesco to see the patient and I also ordered ultrasound of upper quadrant for evaluation of gallbladder disease.  However, the patient signed out AGAINST MEDICAL ADVICE and the patient [facility] that [facility] The patient reportedly went out of the hospital yesterday evening and when she came back, the nurses reported that she had a smell of marijuana and the patient's urine toxicology also positive for cannabinoids. The patient yesterday, 01/07/2013, said that she had nausea, vomiting whenever she tried to eat but today at lunchtime, she finished her lunch, and she did not have any vomiting. The patient's seem to be very comfortable when I went to see her regarding her decision> .  I  explained to her that she will be seen by GYN and also surgeon,  but she wanted to go AMA and she left.  The patient did receive Rocephin because her urine showed 1+ bacteria and she had some dysuria.. Other than that,  I already spoke with her mother, explained to her about the treatment plan TIME SPENT: On discharge preparation took more than 30 minutes.   ____________________________ Katha HammingSnehalatha Ines Rebel, MD sk:cc D: 01/08/2013 22:39:29 ET T: 01/08/2013 23:28:19 ET JOB#: [id number]  cc: Katha HammingSnehalatha Anyela Napierkowski, MD, <Dictator> Katha HammingSNEHALATHA Tammala Weider MD ELECTRONICALLY SIGNED 01/22/2013 18:56

## 2015-01-23 NOTE — H&P (Signed)
PATIENT NAME:  Natalie Riggs, Natalie Riggs MR#:  [id number] DATE OF BIRTH:  12/11/1990  DATE OF ADMISSION:  10/15/2012  ADMITTING PHYSICIAN: Enid Baasadhika Dermot Gremillion, MD  PRIMARY CARE PHYSICIAN: None.   CHIEF COMPLAINT: Nausea, vomiting and diarrhea.   HISTORY OF PRESENT ILLNESS: Natalie Riggs is a 24 year old young African American female with no significant past medical history who presents to the hospital with two day history of intractable nausea and vomiting and one day history of diarrhea. The patient says that she has not traveled outside recently and did not eat anything outside. Two days ago she started with nausea and vomiting and also left upper and left lower quadrant abdominal pain and started to have diarrhea for the past one day. She has been in the ER since last night, has not gotten any better, so she is being admitted under observation. No sick contacts, no recent travel and not been on any antibiotics recently.   PAST MEDICAL HISTORY: None.   PAST SURGICAL HISTORY: Two vaginal deliveries and a miscarriage, but no C-section or other surgeries.   ALLERGIES: No known drug allergies.   HOME MEDICATIONS: Not on any medications at home.   SOCIAL HISTORY: Lives at home with mother. No smoking, alcohol or drug use. Not working.   FAMILY HISTORY: Mother with hypertension. No cardiac history, stroke or cancer running in the family.   REVIEW OF SYSTEMS:  CONSTITUTIONAL: No fever. Positive for chills and generalized weakness.   EYES: No glaucoma, cataracts or inflammation. Normal vision.   ENT: No tinnitus, ear pain, epistaxis or discharge.  RESPIRATORY: No cough, wheeze, hemoptysis or COPD.  CARDIOVASCULAR: No chest pain, orthopnea, edema, arrhythmia, palpitations or syncope.   GASTROINTESTINAL: Positive for abdominal pain, nausea, vomiting and diarrhea. No melena or hematemesis.    GENITOURINARY: No dysuria, hematuria, renal calculus, frequency or incontinence.   ENDOCRINE: No polyuria,  nocturia, thyroid problems, heat or cold intolerance.   HEMATOLOGY: No anemia, easy bruising or bleeding.   SKIN: No acne, rash or lesions.   MUSCULOSKELETAL: No neck, back or shoulder pain. No arthritis. No gout.   NEUROLOGIC: No numbness, weakness, CVA, TIA or seizures.   PSYCHOLOGICAL: No anxiety, insomnia or depression.   PHYSICAL EXAMINATION:  VITAL SIGNS: Temperature is 98 degrees Fahrenheit, pulse 76, respirations 20, blood pressure 99/69 and pulse oximetry 99% on room air.   GENERAL: Well built, well nourished female lying in bed, not in any acute distress.   HEENT: Normocephalic, atraumatic. Pupils equal, round and reacting to light. Anicteric sclerae. Extraocular movements intact. Oropharynx clear without erythema, mass or exudates. Dry mucous membranes.  NECK: Supple. No thyromegaly, JVD or carotid bruits. No lymphadenopathy.   RESPIRATORY: Moving air bilaterally. No wheeze or crackles. No use of accessory muscles for breathing.   CARDIOVASCULAR: S1, S2 regular rate and rhythm. No murmurs, rubs or gallops.   ABDOMEN: Tender in the left upper, left lower quadrants. No guarding or rigidity. Normal bowel sounds.   EXTREMITIES: No pedal edema. No clubbing or cyanosis. 2+ dorsalis pedis pulses palpable bilaterally.   SKIN: No acne, rash or lesions.   LYMPHATICS: No cervical lymphadenopathy.   NEUROLOGIC: Cranial nerves intact. No focal motor or sensory deficit.   PSYCH: The patient is awake, alert and oriented x 3.   LABS/RADIOLOGIC STUDIES: WBC 6.6, hemoglobin 10.5, hematocrit 41.8 and platelet count 220.   Sodium 136, potassium 3.7, chloride 105, bicarbonate 25, BUN 15, creatinine 0.8, glucose 91 and calcium 99.4. ALT 20, AST 17, alkaline phosphatase 111, total bilirubin  0.7 and albumin 4.6.   Urinalysis is negative for any infection.   Pregnancy test is negative, no trichomonas seen.  CT of the abdomen is showing no evidence of bowel obstruction, ileus or  objective signs of gastroenteritis or other acute inflammation of bowel. Poor definition of left adnexal region with complex density, could be cystic ovarian process, and heterogeneous uterus as well. No evidence of acute urinary tract abnormality or acute hepatobiliary abnormality. However, there is subjective mild thickening of the wall of the descending colon without evidence of surrounding inflammatory changes.   Ultrasound of the pelvis is showing no free fluid, normal pelvic ultrasound and normal echotexture of the uterus and no abnormal solid or cystic myometrial masses or lesions seen and the ovaries are normal in size.   ASSESSMENT AND PLAN: A 24 year old female with no significant past medical history presenting with nausea, vomiting and diarrhea for two days.  Intractable nausea, vomiting and diarrhea: Could be gastroenteritis. Since she is unable to eat anything for the past two days, we will admit under observation, give IV fluids and pain and nausea medications. CT of the abdomen and pelvic ultrasound is negative, except for mild changes in descending colon which is where she has the tenderness on exam. Could be early colitis. We will also order Cipro and Flagyl to see if any symptomatic improvement. If no improvement in 24 hours, we will GI and surgical consults. Stool studies have been ordered and likely discharge in 1 to 2 days.   TOTAL TIME SPENT ON ADMISSION: 50 minutes.  ____________________________ Enid Baas, MD rk:sb D: 10/15/2012 14:14:24 ET T: 10/15/2012 14:38:57 ET JOB#: [id number]  cc: Enid Baas, MD, <Dictator> Enid Baas MD ELECTRONICALLY SIGNED 10/15/2012 15:23

## 2015-01-24 NOTE — Op Note (Signed)
PATIENT NAME:  Natalie Riggs, Natalie Riggs MR#:  [id number] DATE OF BIRTH:  25-Feb-1991  DATE OF PROCEDURE:  12/17/2013  PREOPERATIVE DIAGNOSIS: Incomplete abortion at [redacted] weeks gestation.  POSTOPERATIVE DIAGNOSIS: Incomplete abortion at [redacted] weeks gestation.  PROCEDURE: Suction dilation and curettage.  SURGEON: Hildred LaserAnika Chioke Noxon, Riggs.D.   ANESTHESIA: General.   ESTIMATED BLOOD LOSS: 200 mL.  OPERATIVE FLUIDS: 700 mL.   URINE OUTPUT: 20 mL.   COMPLICATIONS: None.   FINDINGS: Blood clots noted in the vaginal vault. Cervical os dilated to approximately 1 to 2 cm. Uterus was sounded to 10 cm. POC visualized during suction.   SPECIMEN: Products of conception.   DESCRIPTION OF PROCEDURE: The patient was taken to the operating room where she was placed under general anesthesia without difficulty. She was then placed in the dorsal lithotomy position using candy-cane stirrups and was prepped and draped in a sterile fashion.  A straight catheterization was performed with return of 20 mL of clear urine.  A sterile speculum was then placed into the patient's vagina and the cervix was noted to be approximately 1 to 2 cm dilated.  Next, a single-tooth tenaculum was used to grasp the anterior lip of the cervix and after this a 10 mm suction curette was then advanced gently to the uterine fundus. Suction device was then activated and the curette was rotated to clear the uterus of products of conception. The suction device was then removed from the uterus and a sharp curettage was then performed until a gritty texture was noted. Suction curettage was then reintroduced to clear the uterus of the remaining products. There was minimal bleeding noted during the procedure; however, prior to procedure approximately 100 mL and clots had been removed from the vaginal vault. The single-tooth tenaculum was removed and good hemostasis was noted. The patient was taken to recovery room in stable condition. She tolerated the procedure well. All  instrument and sponge counts were correct x2 at the end of the procedure.  ____________________________ Jacques EarthlyAnika S. Valentino Saxonherry, MD asc:sb D: 12/17/2013 00:49:55 ET T: 12/17/2013 08:24:33 ET JOB#: [id number]  cc: Jacques EarthlyAnika S. Valentino Saxonherry, MD, <Dictator> Fabian NovemberANIKA S Tehillah Cipriani MD ELECTRONICALLY SIGNED 12/23/2013 8:37

## 2015-01-25 NOTE — H&P (Signed)
Subjective/Chief Complaint Vaginal bleeding arrival to EMS    History of Present Illness Patient is a 24 yo [id number] who initially presented via EMS for vaginal bleeding with no PNC reportedly [redacted] weeks pregnant.  The patient reports an unsure LMP of March 14th 2013 which would make her 17 weeks 2 days gestation today.  On further questioning the patient reports significant bleeding with passage of tissue approximately 2 weeks ago, the bleeding had stopped and she now presents with new onset spotting.  She denies fevers, chills, abdominal pain.  She has no prior history of ectopic pregnancy or miscarriage.  She does not know her blood type.    Past History Past Medical: none  Past Surgical History: none  Past Obstetric G34P2012, TSVD x 3, SAB x 1  Past Gyneclogic History: Prior history of chlamydia, no prior paps    Past Medical Health None   Past Med/Surgical Hx:  Denies medical history:   Denies surgical history.:   ALLERGIES:  No Known Allergies:     Medications None   Family and Social History:   Family History Non-Contributory     Social History negative tobacco, negative ETOH, negative Illicit drugs    Place of Living Home    Review of Systems:   Fever/Chills No     Cough No     Sputum No     Abdominal Pain No     Diarrhea No     Constipation No     Nausea/Vomiting No     SOB/DOE No     Chest Pain No     Telemetry Reviewed Afib    Dysuria No     Tolerating Diet Yes    Physical Exam:   GEN no acute distress, thin    HEENT normocephalic, anicteric    RESP normal resp effort     CARD No LE edema     ABD denies tenderness  soft  no Adominal Mass     GU no superpubic tenderness  Normal external female genitalia, no bleeding noted, cervix closed, uterus retroverted, no adnexal masses or tenderness noted     EXTR negative cyanosis/clubbing, negative edema    SKIN No rashes    PSYCH alert    Additional Comments Bedside US today reveleas  the folowing: Uterues retroverted/retroflexed, midline 6.15 x 3.32 x 3.48cm EMS 0.7458mm trilaminar CDS no free fluid  LOV 3.67 x 1.72 x 1.80 cm containing several small follicles <10712mm ROV 4.01 x 2.55 x 2.51 cm containing several small follicles and one irregular bordered thick walled cyst 1.7cm in greates dimension   Radiology Results:  US:    21-Mar-13 17:12, US Pelvis Ultrasound Exam with Transvaginal - NON-OB   US Pelvis Ultrasound Exam with Transvaginal - NON-OB   REASON FOR EXAM:    vaginal bleeding  COMMENTS:       PROCEDURE: US  - US PELVIS EXAM W/TRANSVAGINAL  - Dec 22 2011  5:12PM     RESULT: Pelvic ultrasound dated 12/22/2011.    Technique: Endovaginal imaging of the pelvis was obtained.    Findings: The uterus measures 6.33 x 3.9 x 5.15 cm demonstrating a   homogeneous echotexture endometrial thickness of 3.9 mm. The right ovary   measures 3.2 x 1.75 x 2.26 cm and the left 3.38 x 2.27 x 2.52 cm.   Follicles identified within the right left ovaries. Color flow is   identified within the ovaries. There is no evidence of pelvic masses,   free  fluid. There is no evidence of hydronephrosis.  IMPRESSION:  Unremarkable pelvic ultrasound.          Verified By: Jani Files, M.D., MD     Assessment/Admission Diagnosis 24 yo [phone] presenting with what appears to be a completed SAB    Plan - Obtain BHCG to verify <5 as patient passed pregnancy 2 weeks ago and to rule out ectopic given right ovarian cyst - T&S to see if patient requires rhogam per prior records B+ (though) - patient desires depo provera after discussion of contraception this is her third pregnancy in the span of 3 years   Electronic Signatures: Natalie Riggs (MD)  (Signed 13-Jul-13 21:46)  Authored: CHIEF COMPLAINT and HISTORY, PAST MEDICAL/SURGIAL HISTORY, ALLERGIES, HOME MEDICATIONS, OTHER MEDICATIONS, FAMILY AND SOCIAL HISTORY, REVIEW OF SYSTEMS, PHYSICAL EXAM, Radiology, ASSESSMENT AND  PLAN   Last Updated: 13-Jul-13 21:46 by Natalie Riggs (MD)

## 2015-02-10 NOTE — H&P (Signed)
L&D Evaluation:  History:  HPI PT sent to L&D from ED with reports of "leaking fluid" about an hour before she came in. She reports that she is about [redacted] weeks pregnant and has a confirmation from [facility] from 05/2013. She reports that she has [facility] but was told to continue prenatal care at the [facility] at that time. She has since had no prenatal care. She reports +FM, denies ctx.   Presents with leaking fluid   Patient's Medical History unknown   Patient's Surgical History other  unknown   Medications Pre Natal Vitamins   Allergies NKDA   Social History unknown   Family History Non-Contributory   ROS:  ROS All systems were reviewed.  HEENT, CNS, GI, GU, Respiratory, CV, Renal and Musculoskeletal systems were found to be normal.   Exam:  Vital Signs elevated BP 150's/80's   General no apparent distress   Mental Status clear   Chest clear   Heart normal sinus rhythm   Abdomen abdomen enlarged. No ctx   FHT other, unable to find fetal heart tones   Other u/s on L&D shows no fetus.- Sent to radiology for formal u/s which reveals no IUP at this time.   Impression:  Impression other, no IUP   Plan:  Plan discharge, to ED for further eval of enlarged abdomen   Comments Discussed with pt that there was no IUP seen at this time on either u/s and that pt needs to be evaluated for her enlarged abdomen in the ED as it is a medical problem at this time, not obstetrical. Pt tearful and agrees with plan.   Electronic Signatures: Jannet MantisSubudhi, Leshea Jaggers (CNM)  (Signed 10-Jan-15 18:51)  Authored: L&D Evaluation   Last Updated: 10-Jan-15 18:51 by Jannet MantisSubudhi, Sady Monaco (CNM)

## 2015-02-10 NOTE — H&P (Signed)
L&D Evaluation:  History:  HPI 24 yo [id number] vs [id number] presenting to the ER at approximately [redacted] weeks gestation by US dating today and unsure LMP sometime in December of 2014.  The patient has had two vaginal deliveries, I have encountered her in July 2013 at wich time she reported being [redacted] weeks pregnant but showed no IUP on ultrasound and had findings consistent with a completed abortion. At that time she reported being a [id number], and [facility] of 2014 with a postive UPT at [facility] on 05/13/2013.  The patient however vehemntly denies having more than one miscarriage although she was seen on L&D earlier this year 10/27/13 stating she was [redacted] weeks pregnant and had not received any prenatal care since her visit with Dr. Janene HarveyKlett in August.  She presents today with a 1 week history of nausea and emesis up to 30 times daily.  She has had intolerance to all po including fluids.  Denies fevers, chills.  No URI symptoms.  No sick contacts.  Has also had some loose stools.   Presents with Hypermesis gravidarum   Patient's Medical History No Chronic Illness   Patient's Surgical History none   Medications Pre Natal Vitamins   Allergies NKDA   Social History none   Family History Non-Contributory   ROS:  ROS 10 point review of systems negative unless otherwise noted in HPI   Exam:  Vital Signs stable   Urine Protein not completed   General no apparent distress   Mental Status clear   Chest clear   Heart normal sinus rhythm   Abdomen Hyperactive BS, soft, non-tender, non-distended   Back no CVAT   Edema no edema   Impression:  Impression Hyperemesis gravidarum at [redacted] weeks gestation   Plan:  Comments 1) Hyperemesis gravidarum     - IVF     - IVF antiemetics     - clear liquid diet     - Repeat BMP and magnesium in AM     - IV thiamin and prenatal vitamin  2) Early pregnancy - needs to have follow up arranged at [facility] states she does not have follow up in  place then quickly said she does.  Given her history multiple visit for UPT's at [facility] and her prior presentation to L&D thinking she was 25 weeks without prenatal care and in fact not being pregnant or having miscarried and her visit last year in August with a documented positive urine pregnnacy test at [facility] no further follow up I worry that there may be some extenuating social factors.  I will obtain a social work consult this admission.   Electronic Signatures: Lorrene ReidStaebler, Jezabella Schriever M (MD)  (Signed 07-Feb-15 22:09)  Authored: L&D Evaluation   Last Updated: 07-Feb-15 22:09 by Lorrene ReidStaebler, Tauri Ethington M (MD)

## 2015-02-28 ENCOUNTER — Emergency Department
Admission: EM | Admit: 2015-02-28 | Discharge: 2015-02-28 | Disposition: A | Discharge status: Home / Self Care | Payer: Self-pay | Attending: Emergency Medicine | Admitting: Emergency Medicine

## 2015-02-28 ENCOUNTER — Encounter: Payer: Self-pay | Admitting: Emergency Medicine

## 2015-02-28 DIAGNOSIS — Z720 Tobacco use: Secondary | ICD-10-CM | POA: Insufficient documentation

## 2015-02-28 DIAGNOSIS — N939 Abnormal uterine and vaginal bleeding, unspecified: Secondary | ICD-10-CM

## 2015-02-28 DIAGNOSIS — Z3202 Encounter for pregnancy test, result negative: Secondary | ICD-10-CM | POA: Insufficient documentation

## 2015-02-28 DIAGNOSIS — N938 Other specified abnormal uterine and vaginal bleeding: Secondary | ICD-10-CM | POA: Insufficient documentation

## 2015-02-28 HISTORY — DX: Complete or unspecified spontaneous abortion without complication: O03.9

## 2015-02-28 LAB — BASIC METABOLIC PANEL
Anion gap: 8 (ref 5–15)
BUN: 15 mg/dL (ref 6–20)
CHLORIDE: 105 mmol/L (ref 101–111)
CO2: 24 mmol/L (ref 22–32)
Calcium: 9.1 mg/dL (ref 8.9–10.3)
Creatinine, Ser: 0.86 mg/dL (ref 0.44–1.00)
GFR calc Af Amer: >60 mL/min (ref >60)
Glucose, Bld: 92 mg/dL (ref 65–99)
POTASSIUM: 3.6 mmol/L (ref 3.5–5.1)
Sodium: 137 mmol/L (ref 135–145)

## 2015-02-28 LAB — CBC
HCT: 34.6 % — ABNORMAL LOW (ref 35.0–47.0)
Hemoglobin: 11.4 g/dL — ABNORMAL LOW (ref 12.0–16.0)
MCH: 29.5 pg (ref 26.0–34.0)
MCHC: 33 g/dL (ref 32.0–36.0)
MCV: 89.5 fL (ref 80.0–100.0)
PLATELETS: 163 10*3/uL (ref 150–440)
RBC: 3.87 MIL/uL (ref 3.80–5.20)
RDW: 13.8 % (ref 11.5–14.5)
WBC: 4 10*3/uL (ref 3.6–11.0)

## 2015-02-28 LAB — ABO/RH
ABO/RH(D): B POS
ABO/RH(D): B POS

## 2015-02-28 LAB — TSH: TSH: 0.86 u[IU]/mL (ref 0.350–4.500)

## 2015-02-28 LAB — HCG, QUANTITATIVE, PREGNANCY: hCG, Beta Chain, Quant, S: <1 m[IU]/mL (ref <5)

## 2015-02-28 NOTE — ED Notes (Signed)
[facility] notified, they will send a report for pt.

## 2015-02-28 NOTE — ED Notes (Signed)
Pt was not in room for discharge instructions, apparently left the room when Dr Demetrius CharityP told her she was going to be discharged. I had not removed her IV prior to her leaving, Dr Demetrius CharityP made aware. Contact # for her called, voicemail picked up, it was not pts name, no voicemail left.

## 2015-02-28 NOTE — ED Provider Notes (Signed)
[facility] Emergency Department Provider Note  Time seen: 12:27 PM  I have reviewed the triage vital signs and the nursing notes.   HISTORY  Chief Complaint Vaginal Bleeding    HPI Natalie Riggs is a 24 y.o. female presents the emergency department for vaginal bleeding and a positive pregnancy test. According to the patient for the last 4-5 days the patient has had vaginal bleeding with clots. The patient took a pregnancy test at home 3 weeks ago which was positive per the patient. Patient was concerned she could be miscarrying so she came to the emergency department for further evaluation. Also has a secondary complaint the patient notes she has lost approximately 20-30 pounds over the past 1 year, unintentionally. Patient denies fever. Mild lower abdominal cramping.    Past Medical History  Diagnosis Date  . Miscarriage     There are no active problems to display for this patient.   Past Surgical History  Procedure Laterality Date  . Dilatation & curettage/hysteroscopy with myosure      No current outpatient prescriptions on file.  Allergies Review of patient's allergies indicates no known allergies.  No family history on file.  Social History History  Substance Use Topics  . Smoking status: Current Every Day Smoker -- 0.50 packs/day    Types: Cigarettes  . Smokeless tobacco: Not on file  . Alcohol Use: Not on file    Review of Systems Constitutional: Negative for fever. Cardiovascular: Negative for chest pain. Respiratory: Negative for shortness of breath. Gastrointestinal: Mild lower abdominal cramping.Marland Kitchen.  10-point ROS otherwise negative.  ____________________________________________   PHYSICAL EXAM:  VITAL SIGNS: ED Triage Vitals  Enc Vitals Group     BP 02/28/15 1158 107/73 mmHg     Pulse Rate 02/28/15 1158 100     Resp --      Temp 02/28/15 1158 98.3 F (36.8 C)     Temp Source 02/28/15 1158 Oral     SpO2 02/28/15  1158 100 %     Weight 02/28/15 1158 150 lb (68.04 kg)     Height 02/28/15 1158 5\' 8"  (1.727 m)     Head Cir --      Peak Flow --      Pain Score 02/28/15 1158 9     Pain Loc --      Pain Edu? --      Excl. in GC? --     Constitutional: Alert and oriented. Well appearing and in no distress. ENT   Mouth/Throat: Mucous membranes are moist. Cardiovascular: Normal rate, regular rhythm. No murmurs, rubs, or gallops. Respiratory: Normal respiratory effort without tachypnea nor retractions. Breath sounds are clear  Gastrointestinal: Soft, nontender, no rebound or guarding. Nondistended.  No CVA tenderness palpation. Musculoskeletal: Nontender with normal range of motion in all extremities.  Neurologic:  Normal speech and language. No gross focal neurologic deficits Skin:  Skin is warm, dry and intact.  Psychiatric: Mood and affect are normal. Speech and behavior are normal.   ____________________________________________  ____________________________________________   INITIAL IMPRESSION / ASSESSMENT AND PLAN / ED COURSE  Pertinent labs & imaging results that were available during my care of the patient were reviewed by me and considered in my medical decision making (see chart for details).  Patient with vaginal bleeding 4 days, positive pregnancy test at home 3 weeks ago. Patient's concerned she could be having miscarriages came to the emergency department for evaluation. We will check labs, ultrasound to further evaluate. Patient also  is complaining of 20-30 pound weight loss over the past one year which is unintentional per the patient. We will check basic labs including a TSH to further evaluate.   ----------------------------------------- 1:50 PM on 02/28/2015 -----------------------------------------  Labs are within normal limits, with a normal hemoglobin. Beta hCG is negative (less than 1). I discussed results with the patient, she will follow up with an OB/GYN. We'll  discharge home at this time. Patient with likely irregular periods since stopping progesterone IM this past November.  ____________________________________________   FINAL CLINICAL IMPRESSION(S) / ED DIAGNOSES  Vaginal bleeding   Minna Antis, MD 02/28/15 1351

## 2015-02-28 NOTE — Discharge Instructions (Signed)
Abnormal Uterine Bleeding Abnormal uterine bleeding can affect women at various stages in life, including teenagers, women in their reproductive years, pregnant women, and women who have reached menopause. Several kinds of uterine bleeding are considered abnormal, including:  Bleeding or spotting between periods.   Bleeding after sexual intercourse.   Bleeding that is heavier or more than normal.   Periods that last longer than usual.  Bleeding after menopause.  Many cases of abnormal uterine bleeding are minor and simple to treat, while others are more serious. Any type of abnormal bleeding should be evaluated by your health care provider. Treatment will depend on the cause of the bleeding. HOME CARE INSTRUCTIONS Monitor your condition for any changes. The following actions may help to alleviate any discomfort you are experiencing:  Avoid the use of tampons and douches as directed by your health care provider.  Change your pads frequently. You should get regular pelvic exams and Pap tests. Keep all follow-up appointments for diagnostic tests as directed by your health care provider.  SEEK MEDICAL CARE IF:   Your bleeding lasts more than 1 week.   You feel dizzy at times.  SEEK IMMEDIATE MEDICAL CARE IF:   You pass out.   You are changing pads every 15 to 30 minutes.   You have abdominal pain.  You have a fever.   You become sweaty or weak.   You are passing large blood clots from the vagina.   You start to feel nauseous and vomit. MAKE SURE YOU:   Understand these instructions.  Will watch your condition.  Will get help right away if you are not doing well or get worse. Document Released: 09/19/2005 Document Revised: 09/24/2013 Document Reviewed: 04/18/2013 Exit[employer] Patient Information 2015 [employer]. This information is not intended to replace advice given to you by your health care provider. Make sure you discuss any questions you have with your  health care provider.  

## 2015-02-28 NOTE — ED Notes (Signed)
Left a message with pts contact to [facility] immedi[facility].

## 2015-02-28 NOTE — ED Notes (Signed)
Vaginal bleeding , 2-3 pads an hour for the last 2 days, prior to bleeding starting pt had a + home preg test, hx of miscarriage/ with D&C

## 2015-06-25 ENCOUNTER — Emergency Department
Admission: EM | Admit: 2015-06-25 | Discharge: 2015-06-25 | Disposition: A | Discharge status: Home / Self Care | Payer: Self-pay | Attending: Emergency Medicine | Admitting: Emergency Medicine

## 2015-06-25 DIAGNOSIS — Z720 Tobacco use: Secondary | ICD-10-CM | POA: Insufficient documentation

## 2015-06-25 DIAGNOSIS — R10819 Abdominal tenderness, unspecified site: Secondary | ICD-10-CM | POA: Insufficient documentation

## 2015-06-25 DIAGNOSIS — R112 Nausea with vomiting, unspecified: Secondary | ICD-10-CM | POA: Insufficient documentation

## 2015-06-25 DIAGNOSIS — R197 Diarrhea, unspecified: Secondary | ICD-10-CM | POA: Insufficient documentation

## 2015-06-25 DIAGNOSIS — R509 Fever, unspecified: Secondary | ICD-10-CM | POA: Insufficient documentation

## 2015-06-25 LAB — CBC WITH DIFFERENTIAL/PLATELET
Basophils Absolute: 0 10*3/uL (ref 0–0.1)
Basophils Relative: 0 %
EOS ABS: 0 10*3/uL (ref 0–0.7)
EOS PCT: 0 %
HCT: 38.3 % (ref 35.0–47.0)
HEMOGLOBIN: 13 g/dL (ref 12.0–16.0)
LYMPHS ABS: 0.7 10*3/uL — AB (ref 1.0–3.6)
Lymphocytes Relative: 5 %
MCH: 30.4 pg (ref 26.0–34.0)
MCHC: 34.1 g/dL (ref 32.0–36.0)
MCV: 89.2 fL (ref 80.0–100.0)
Monocytes Absolute: 0.7 10*3/uL (ref 0.2–0.9)
Monocytes Relative: 5 %
Neutro Abs: 13 10*3/uL — ABNORMAL HIGH (ref 1.4–6.5)
Neutrophils Relative %: 90 %
PLATELETS: 200 10*3/uL (ref 150–440)
RBC: 4.29 MIL/uL (ref 3.80–5.20)
RDW: 13.9 % (ref 11.5–14.5)
WBC: 14.5 10*3/uL — AB (ref 3.6–11.0)

## 2015-06-25 LAB — COMPREHENSIVE METABOLIC PANEL
ALT: 13 U/L — AB (ref 14–54)
AST: 23 U/L (ref 15–41)
Albumin: 3.7 g/dL (ref 3.5–5.0)
Alkaline Phosphatase: 49 U/L (ref 38–126)
Anion gap: 8 (ref 5–15)
BUN: 13 mg/dL (ref 6–20)
CALCIUM: 7.9 mg/dL — AB (ref 8.9–10.3)
CHLORIDE: 111 mmol/L (ref 101–111)
CO2: 20 mmol/L — ABNORMAL LOW (ref 22–32)
CREATININE: 0.71 mg/dL (ref 0.44–1.00)
GFR calc Af Amer: >60 mL/min (ref >60)
Glucose, Bld: 85 mg/dL (ref 65–99)
Potassium: 3.5 mmol/L (ref 3.5–5.1)
Sodium: 139 mmol/L (ref 135–145)
Total Bilirubin: 1.2 mg/dL (ref 0.3–1.2)
Total Protein: 7.2 g/dL (ref 6.5–8.1)

## 2015-06-25 MED ORDER — SODIUM CHLORIDE 0.9 % IV BOLUS (SEPSIS)
1000.0000 mL | Freq: Once | INTRAVENOUS | Status: AC
Start: 2015-06-25 — End: 2015-06-25
  Administered 2015-06-25: 1000 mL via INTRAVENOUS

## 2015-06-25 MED ORDER — PROMETHAZINE HCL 25 MG PO TABS: 25.0000 mg | ORAL_TABLET | Freq: Four times a day (QID) | ORAL | Status: DC | PRN

## 2015-06-25 MED ORDER — PROMETHAZINE HCL 25 MG/ML IJ SOLN
25.0000 mg | Freq: Once | INTRAMUSCULAR | Status: AC
  Administered 2015-06-25: 25 mg via INTRAVENOUS
  Filled 2015-06-25: qty 1

## 2015-06-25 MED ORDER — LOPERAMIDE HCL 2 MG PO CAPS
4.0000 mg | ORAL_CAPSULE | Freq: Once | ORAL | Status: AC
  Administered 2015-06-25: 4 mg via ORAL
  Filled 2015-06-25: qty 2

## 2015-06-25 NOTE — Discharge Instructions (Signed)

## 2015-06-25 NOTE — ED Notes (Signed)
Patient tolerating PO fluids 

## 2015-06-25 NOTE — ED Notes (Signed)
Patient brought in via EMS from home.  Patient woke this morning around 3:30am with nausea, vomiting, and diarrhea.  Patient is alert and oriented upon arrival.  No active vomiting or complaints of nausea as EMS gave Zofran  IV via Left 20g started in field.
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
## 2016-06-05 ENCOUNTER — Emergency Department: Payer: Self-pay

## 2016-06-05 ENCOUNTER — Encounter: Payer: Self-pay | Admitting: Emergency Medicine

## 2016-06-05 ENCOUNTER — Emergency Department
Admission: EM | Admit: 2016-06-05 | Discharge: 2016-06-05 | Disposition: A | Discharge status: Home / Self Care | Payer: Self-pay | Attending: Student in an Organized Health Care Education/Training Program | Admitting: Student in an Organized Health Care Education/Training Program

## 2016-06-05 DIAGNOSIS — Z23 Encounter for immunization: Secondary | ICD-10-CM | POA: Insufficient documentation

## 2016-06-05 DIAGNOSIS — F17210 Nicotine dependence, cigarettes, uncomplicated: Secondary | ICD-10-CM | POA: Insufficient documentation

## 2016-06-05 DIAGNOSIS — Y9389 Activity, other specified: Secondary | ICD-10-CM | POA: Insufficient documentation

## 2016-06-05 DIAGNOSIS — M25552 Pain in left hip: Secondary | ICD-10-CM | POA: Insufficient documentation

## 2016-06-05 DIAGNOSIS — M79642 Pain in left hand: Secondary | ICD-10-CM | POA: Insufficient documentation

## 2016-06-05 DIAGNOSIS — R52 Pain, unspecified: Secondary | ICD-10-CM

## 2016-06-05 DIAGNOSIS — Y9289 Other specified places as the place of occurrence of the external cause: Secondary | ICD-10-CM | POA: Insufficient documentation

## 2016-06-05 DIAGNOSIS — S0990XA Unspecified injury of head, initial encounter: Secondary | ICD-10-CM | POA: Insufficient documentation

## 2016-06-05 DIAGNOSIS — F1010 Alcohol abuse, uncomplicated: Secondary | ICD-10-CM | POA: Insufficient documentation

## 2016-06-05 DIAGNOSIS — Y999 Unspecified external cause status: Secondary | ICD-10-CM | POA: Insufficient documentation

## 2016-06-05 LAB — CBC WITH DIFFERENTIAL/PLATELET
Basophils Absolute: 0 10*3/uL (ref 0–0.1)
Basophils Relative: 0 %
EOS ABS: 0 10*3/uL (ref 0–0.7)
Eosinophils Relative: 0 %
HCT: 32.8 % — ABNORMAL LOW (ref 35.0–47.0)
Hemoglobin: 11.7 g/dL — ABNORMAL LOW (ref 12.0–16.0)
Lymphocytes Relative: 16 %
Lymphs Abs: 1.7 10*3/uL (ref 1.0–3.6)
MCH: 31.6 pg (ref 26.0–34.0)
MCHC: 35.7 g/dL (ref 32.0–36.0)
MCV: 88.5 fL (ref 80.0–100.0)
MONO ABS: 0.6 10*3/uL (ref 0.2–0.9)
Monocytes Relative: 6 %
Neutro Abs: 8.4 10*3/uL — ABNORMAL HIGH (ref 1.4–6.5)
Neutrophils Relative %: 78 %
PLATELETS: 146 10*3/uL — AB (ref 150–440)
RBC: 3.7 MIL/uL — ABNORMAL LOW (ref 3.80–5.20)
RDW: 13.9 % (ref 11.5–14.5)
WBC: 10.8 10*3/uL (ref 3.6–11.0)

## 2016-06-05 LAB — POCT PREGNANCY, URINE: Preg Test, Ur: NEGATIVE

## 2016-06-05 MED ORDER — TETANUS-DIPHTH-ACELL PERTUSSIS 5-2.5-18.5 LF-MCG/0.5 IM SUSP
0.5000 mL | Freq: Once | INTRAMUSCULAR | Status: AC
  Administered 2016-06-05: 0.5 mL via INTRAMUSCULAR
  Filled 2016-06-05: qty 0.5

## 2016-06-05 MED ORDER — ACETAMINOPHEN 500 MG PO TABS
1000.0000 mg | ORAL_TABLET | Freq: Once | ORAL | Status: AC
  Administered 2016-06-05: 1000 mg via ORAL
  Filled 2016-06-05: qty 2

## 2016-06-05 MED ORDER — BACITRACIN ZINC 500 UNIT/GM EX OINT
TOPICAL_OINTMENT | CUTANEOUS | Status: AC
  Filled 2016-06-05: qty 0.9

## 2016-06-05 MED ORDER — LIDOCAINE-EPINEPHRINE-TETRACAINE (LET) SOLUTION
3.0000 mL | Freq: Once | NASAL | Status: AC
  Administered 2016-06-05: 3 mL via TOPICAL
  Filled 2016-06-05: qty 3

## 2016-06-05 MED ORDER — BACITRACIN-NEOMYCIN-POLYMYXIN 400-5-5000 EX OINT: TOPICAL_OINTMENT | Freq: Once | CUTANEOUS | Status: DC
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
## 2016-06-05 NOTE — Discharge Instructions (Signed)
Return to ER for any concerns or worsening symptoms.

## 2016-06-05 NOTE — ED Notes (Signed)
bacitracin applied to head.
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
## 2016-08-18 ENCOUNTER — Emergency Department (HOSPITAL_COMMUNITY)
Admission: EM | Admit: 2016-08-18 | Discharge: 2016-08-18 | Disposition: A | Discharge status: Home / Self Care | Payer: Self-pay | Attending: Emergency Medicine | Admitting: Emergency Medicine

## 2016-08-18 ENCOUNTER — Emergency Department (HOSPITAL_COMMUNITY): Payer: Self-pay

## 2016-08-18 ENCOUNTER — Encounter (HOSPITAL_COMMUNITY): Payer: Self-pay | Admitting: Emergency Medicine

## 2016-08-18 DIAGNOSIS — F17210 Nicotine dependence, cigarettes, uncomplicated: Secondary | ICD-10-CM | POA: Insufficient documentation

## 2016-08-18 DIAGNOSIS — R1032 Left lower quadrant pain: Secondary | ICD-10-CM

## 2016-08-18 DIAGNOSIS — N83202 Unspecified ovarian cyst, left side: Secondary | ICD-10-CM

## 2016-08-18 DIAGNOSIS — K5900 Constipation, unspecified: Secondary | ICD-10-CM

## 2016-08-18 LAB — CBC
HEMATOCRIT: 37 % (ref 36.0–46.0)
HEMOGLOBIN: 12.7 g/dL (ref 12.0–15.0)
MCH: 29.8 pg (ref 26.0–34.0)
MCHC: 34.3 g/dL (ref 30.0–36.0)
MCV: 86.9 fL (ref 78.0–100.0)
Platelets: 271 10*3/uL (ref 150–400)
RBC: 4.26 MIL/uL (ref 3.87–5.11)
RDW: 12.4 % (ref 11.5–15.5)
WBC: 8.4 10*3/uL (ref 4.0–10.5)

## 2016-08-18 LAB — COMPREHENSIVE METABOLIC PANEL
ALBUMIN: 4.2 g/dL (ref 3.5–5.0)
ALT: 9 U/L — ABNORMAL LOW (ref 14–54)
ANION GAP: 10 (ref 5–15)
AST: 19 U/L (ref 15–41)
Alkaline Phosphatase: 59 U/L (ref 38–126)
BILIRUBIN TOTAL: 0.7 mg/dL (ref 0.3–1.2)
BUN: 9 mg/dL (ref 6–20)
CO2: 22 mmol/L (ref 22–32)
Calcium: 9.5 mg/dL (ref 8.9–10.3)
Chloride: 104 mmol/L (ref 101–111)
Creatinine, Ser: 0.76 mg/dL (ref 0.44–1.00)
GFR calc Af Amer: >60 mL/min (ref >60)
GFR calc non Af Amer: >60 mL/min (ref >60)
GLUCOSE: 123 mg/dL — AB (ref 65–99)
POTASSIUM: 3.8 mmol/L (ref 3.5–5.1)
SODIUM: 136 mmol/L (ref 135–145)
TOTAL PROTEIN: 8.2 g/dL — AB (ref 6.5–8.1)

## 2016-08-18 LAB — I-STAT BETA HCG BLOOD, ED (MC, WL, AP ONLY): I-stat hCG, quantitative: <5 m[IU]/mL (ref <5)

## 2016-08-18 LAB — LIPASE, BLOOD: LIPASE: 23 U/L (ref 11–51)

## 2016-08-18 LAB — POC OCCULT BLOOD, ED: FECAL OCCULT BLD: NEGATIVE

## 2016-08-18 MED ORDER — IBUPROFEN 800 MG PO TABS: 800.0000 mg | ORAL_TABLET | Freq: Four times a day (QID) | ORAL | 0 refills | Status: DC | PRN

## 2016-08-18 MED ORDER — ACETAMINOPHEN 500 MG PO TABS: 1000.0000 mg | ORAL_TABLET | Freq: Four times a day (QID) | ORAL | 0 refills | Status: DC | PRN

## 2016-08-18 MED ORDER — POLYETHYLENE GLYCOL 3350 17 GM/SCOOP PO POWD: ORAL | 0 refills | Status: DC

## 2016-08-18 MED ORDER — ACETAMINOPHEN 500 MG PO TABS
1000.0000 mg | ORAL_TABLET | Freq: Once | ORAL | Status: AC
  Administered 2016-08-18: 1000 mg via ORAL
  Filled 2016-08-18: qty 2

## 2016-08-18 MED ORDER — IBUPROFEN 400 MG PO TABS
400.0000 mg | ORAL_TABLET | Freq: Once | ORAL | Status: AC
  Administered 2016-08-18: 400 mg via ORAL
  Filled 2016-08-18: qty 1

## 2016-08-18 MED ORDER — ONDANSETRON 4 MG PO TBDP
4.0000 mg | ORAL_TABLET | Freq: Once | ORAL | Status: AC
  Administered 2016-08-18: 4 mg via ORAL

## 2016-08-18 MED ORDER — HALOPERIDOL LACTATE 5 MG/ML IJ SOLN
5.0000 mg | Freq: Once | INTRAMUSCULAR | Status: AC
  Administered 2016-08-18: 5 mg via INTRAMUSCULAR
  Filled 2016-08-18: qty 1

## 2016-08-18 MED ORDER — ONDANSETRON 4 MG PO TBDP
ORAL_TABLET | ORAL | Status: AC
  Filled 2016-08-18: qty 1

## 2016-08-18 NOTE — ED Provider Notes (Signed)
[facility]-EMERGENCY DEPT Provider Note   CSN: [id number] Arrival date & time: 08/18/16  1131     History   Chief Complaint Chief Complaint  Patient presents with  . Constipation  . Rectal Pain    HPI Natalie Riggs is a 25 y.o. female.  The history is provided by the patient.  Constipation   This is a new problem. Episode onset: 4 days ago. Associated symptoms include abdominal pain (LLQ). There is no fiber in the patient's diet. She has tried nothing for the symptoms.  Abdominal Pain   This is a new problem. The current episode started more than 2 days ago. The problem occurs daily. The problem has been gradually worsening. The pain is located in the LLQ. The quality of the pain is sharp. The pain is moderate. Associated symptoms include constipation. Nothing aggravates the symptoms. Nothing relieves the symptoms.    Past Medical History:  Diagnosis Date  . Miscarriage     There are no active problems to display for this patient.   Past Surgical History:  Procedure Laterality Date  . DILATATION & CURETTAGE/HYSTEROSCOPY WITH MYOSURE      OB History    No data available       Home Medications    Prior to Admission medications   Medication Sig Start Date End Date Taking? Authorizing Provider  promethazine (PHENERGAN) 25 MG tablet Take 1 tablet (25 mg total) by mouth every 6 (six) hours as needed for nausea or vomiting. 06/25/15   Minna AntisKevin Paduchowski, MD    Family History History reviewed. No pertinent family history.  Social History Social History  Substance Use Topics  . Smoking status: Current Every Day Smoker    Packs/day: 0.50    Types: Cigarettes  . Smokeless tobacco: Never Used  . Alcohol use No     Allergies   Patient has no known allergies.   Review of Systems Review of Systems  Gastrointestinal: Positive for abdominal pain (LLQ) and constipation.  All other systems reviewed and are negative.    Physical Exam Updated Vital Signs BP 110/83  (BP Location: Right Arm)   Pulse 87   Temp 98.4 F (36.9 C) (Oral)   Resp 26   SpO2 100%   Physical Exam  Constitutional: She is oriented to person, place, and time. She appears well-developed and well-nourished. No distress.  HENT:  Head: Normocephalic.  Nose: Nose normal.  Eyes: Conjunctivae are normal.  Neck: Neck supple. No tracheal deviation present.  Cardiovascular: Normal rate and regular rhythm.   Pulmonary/Chest: Effort normal. No respiratory distress.  Abdominal: Soft. She exhibits no distension and no mass. There is tenderness (to left of suprapubic area). There is no rebound and no guarding.  Neurological: She is alert and oriented to person, place, and time.  Skin: Skin is warm and dry.  Psychiatric: She has a normal mood and affect.     ED Treatments / Results  Labs (all labs ordered are listed, but only abnormal results are displayed) Labs Reviewed  COMPREHENSIVE METABOLIC PANEL - Abnormal; Notable for the following:       Result Value   Glucose, Bld 123 (*)    Total Protein 8.2 (*)    ALT 9 (*)    All other components within normal limits  LIPASE, BLOOD  CBC  I-STAT BETA HCG BLOOD, ED ([facility], [facility], [facility] ONLY)  POC OCCULT BLOOD, ED    EKG  EKG Interpretation None       Radiology [place] Transvaginal  Non-ob  Result Date: 08/18/2016 CLINICAL DATA:  25 year old female with left lower quadrant and rectal pain. Constipation. LMP 07/30/2016. EXAM: TRANSABDOMINAL AND TRANSVAGINAL ULTRASOUND OF PELVIS DOPPLER ULTRASOUND OF OVARIES TECHNIQUE: Both transabdominal and transvaginal ultrasound examinations of the pelvis were performed. Transabdominal technique was performed for global imaging of the pelvis including uterus, ovaries, adnexal regions, and pelvic cul-de-sac. It was necessary to proceed with endovaginal exam following the transabdominal exam to visualize the endometrium and adnexa. Color and duplex Doppler ultrasound was utilized to evaluate blood flow to the  ovaries. COMPARISON:  12/24/2013 obstetric scan. 01/07/2013 CT abdomen/pelvis. FINDINGS: Uterus Measurements: 9.1 x 4.5 x 5.7 cm. Anteverted uterus is top-normal in size, with no uterine fibroids or other myometrial abnormalities. Endometrium Thickness: 8 mm. No endometrial cavity fluid or focal endometrial mass demonstrated. Right ovary Measurements: 3.2 x 2.2 x 1.9 cm. Normal appearance/no adnexal mass. Left ovary Measurements: 5.6 x 3.3 x 4.1 cm. There is a heterogeneously hyperechoic 4.2 x 3.8 x 3.6 cm left ovarian mass with no internal vascularity on color Doppler. Pulsed Doppler evaluation of both ovaries demonstrates normal low-resistance arterial and venous waveforms. Other findings Small volume free fluid in the pelvic cul-de-sac. IMPRESSION: 1. No evidence of adnexal torsion. 2. Complex 4.2 cm heterogeneously hyperechoic left ovarian mass with no internal vascularity, likely a left ovarian hemorrhagic cyst. A follow-up transvaginal pelvic sonogram is recommended in 2-3 menstrual cycles to document resolution. 3. Small volume free fluid in the pelvic cul-de-sac, nonspecific, probably physiologic. 4. Normal anteverted uterus. Electronically Signed   By: Delbert PhenixJason A Poff M.D.   On: 08/18/2016 14:22   [place] Pelvis Complete  Result Date: 08/18/2016 CLINICAL DATA:  25 year old female with left lower quadrant and rectal pain. Constipation. LMP 07/30/2016. EXAM: TRANSABDOMINAL AND TRANSVAGINAL ULTRASOUND OF PELVIS DOPPLER ULTRASOUND OF OVARIES TECHNIQUE: Both transabdominal and transvaginal ultrasound examinations of the pelvis were performed. Transabdominal technique was performed for global imaging of the pelvis including uterus, ovaries, adnexal regions, and pelvic cul-de-sac. It was necessary to proceed with endovaginal exam following the transabdominal exam to visualize the endometrium and adnexa. Color and duplex Doppler ultrasound was utilized to evaluate blood flow to the ovaries. COMPARISON:  12/24/2013  obstetric scan. 01/07/2013 CT abdomen/pelvis. FINDINGS: Uterus Measurements: 9.1 x 4.5 x 5.7 cm. Anteverted uterus is top-normal in size, with no uterine fibroids or other myometrial abnormalities. Endometrium Thickness: 8 mm. No endometrial cavity fluid or focal endometrial mass demonstrated. Right ovary Measurements: 3.2 x 2.2 x 1.9 cm. Normal appearance/no adnexal mass. Left ovary Measurements: 5.6 x 3.3 x 4.1 cm. There is a heterogeneously hyperechoic 4.2 x 3.8 x 3.6 cm left ovarian mass with no internal vascularity on color Doppler. Pulsed Doppler evaluation of both ovaries demonstrates normal low-resistance arterial and venous waveforms. Other findings Small volume free fluid in the pelvic cul-de-sac. IMPRESSION: 1. No evidence of adnexal torsion. 2. Complex 4.2 cm heterogeneously hyperechoic left ovarian mass with no internal vascularity, likely a left ovarian hemorrhagic cyst. A follow-up transvaginal pelvic sonogram is recommended in 2-3 menstrual cycles to document resolution. 3. Small volume free fluid in the pelvic cul-de-sac, nonspecific, probably physiologic. 4. Normal anteverted uterus. Electronically Signed   By: Delbert PhenixJason A Poff M.D.   On: 08/18/2016 14:22   [place] Art/ven Flow Abd Pelv Doppler  Result Date: 08/18/2016 CLINICAL DATA:  25 year old female with left lower quadrant and rectal pain. Constipation. LMP 07/30/2016. EXAM: TRANSABDOMINAL AND TRANSVAGINAL ULTRASOUND OF PELVIS DOPPLER ULTRASOUND OF OVARIES TECHNIQUE: Both transabdominal and transvaginal ultrasound examinations of the pelvis  were performed. Transabdominal technique was performed for global imaging of the pelvis including uterus, ovaries, adnexal regions, and pelvic cul-de-sac. It was necessary to proceed with endovaginal exam following the transabdominal exam to visualize the endometrium and adnexa. Color and duplex Doppler ultrasound was utilized to evaluate blood flow to the ovaries. COMPARISON:  12/24/2013 obstetric scan.  01/07/2013 CT abdomen/pelvis. FINDINGS: Uterus Measurements: 9.1 x 4.5 x 5.7 cm. Anteverted uterus is top-normal in size, with no uterine fibroids or other myometrial abnormalities. Endometrium Thickness: 8 mm. No endometrial cavity fluid or focal endometrial mass demonstrated. Right ovary Measurements: 3.2 x 2.2 x 1.9 cm. Normal appearance/no adnexal mass. Left ovary Measurements: 5.6 x 3.3 x 4.1 cm. There is a heterogeneously hyperechoic 4.2 x 3.8 x 3.6 cm left ovarian mass with no internal vascularity on color Doppler. Pulsed Doppler evaluation of both ovaries demonstrates normal low-resistance arterial and venous waveforms. Other findings Small volume free fluid in the pelvic cul-de-sac. IMPRESSION: 1. No evidence of adnexal torsion. 2. Complex 4.2 cm heterogeneously hyperechoic left ovarian mass with no internal vascularity, likely a left ovarian hemorrhagic cyst. A follow-up transvaginal pelvic sonogram is recommended in 2-3 menstrual cycles to document resolution. 3. Small volume free fluid in the pelvic cul-de-sac, nonspecific, probably physiologic. 4. Normal anteverted uterus. Electronically Signed   By: Delbert Phenix M.D.   On: 08/18/2016 14:22    Procedures Procedures (including critical care time)  Medications Ordered in ED Medications  ondansetron (ZOFRAN-ODT) 4 MG disintegrating tablet (not administered)  ondansetron (ZOFRAN-ODT) disintegrating tablet 4 mg (4 mg Oral Given 08/18/16 1154)  acetaminophen (TYLENOL) tablet 1,000 mg (1,000 mg Oral Given 08/18/16 1216)  ibuprofen (ADVIL,MOTRIN) tablet 400 mg (400 mg Oral Given 08/18/16 1217)     Initial Impression / Assessment and Plan / ED Course  I have reviewed the triage vital signs and the nursing notes.  Pertinent labs & imaging results that were available during my care of the patient were reviewed by me and considered in my medical decision making (see chart for details).  Clinical Course     25 y.o. female presents with Rectal  pain and left lower quadrant abdominal pain just adjacent to the pelvis without a bowel movement over the last 4 days. The pain is intense and is causing her to vomit. She has not taken anything for the symptoms to this point. She is well-appearing without significant vital sign abnormalities. Intermittent nature raises suspicion slightly for ovarian torsion. Plan will be for pelvic ultrasound to rule out torsion and MiraLAX cleanout for constipation. No fecal impaction noted on rectal exam and no gross blood.  Pt continues to retch and climb in and out of bed nervously after zofran and tylenol/motrin so was given haldol with good relief of vomiting and now asking for ginger ale. Pain free prior to discharge. [place] c/w left hemorrhagic cyst that is likely contributing to symptoms. Provided miralax cleanout and plan to f/u with Gyn on an outpatient basis with no current evidence of torsion.   Final Clinical Impressions(s) / ED Diagnoses   Final diagnoses:  Constipation, unspecified constipation type  Ovarian cyst, left    New Prescriptions Discharge Medication List as of 08/18/2016  2:40 PM    START taking these medications   Details  acetaminophen (TYLENOL) 500 MG tablet Take 2 tablets (1,000 mg total) by mouth every 6 (six) hours as needed for mild pain., Starting Thu 08/18/2016, Print    ibuprofen (ADVIL,MOTRIN) 800 MG tablet Take 1 tablet (800 mg total)  by mouth every 6 (six) hours as needed for moderate pain., Starting Thu 08/18/2016, Print         Lyndal Pulley, MD 08/18/16 [phone]

## 2016-08-18 NOTE — ED Triage Notes (Signed)
Pt here for rectal pain with constipation; pt sts no BM x 4 days

## 2016-08-18 NOTE — ED Notes (Signed)
Dr.Knott at bedside. 

## 2016-08-18 NOTE — ED Notes (Signed)
Patient reports vomiting up pain medication, dr Clydene Pughknott made aware of continued vomiting

## 2016-08-18 NOTE — ED Notes (Signed)
Patient transported to Ultrasound 

## 2016-10-03 NOTE — L&D Delivery Note (Signed)
Report given to oncoming shift.

## 2016-10-03 NOTE — L&D Delivery Note (Signed)
Patient is a 26 y.o. now [id number] s/p NSVD at [redacted]w[redacted]d, who was admitted for IOL for postdates.  Delivery Note At 9:16 PM a viable female was delivered via Vaginal. Spontaneous Delivery  Presentation: face   APGAR: 9, 9; weight pedning Placenta status: intact; to L&D   Cord: 3-vessel  Anesthesia: None Episiotomy: None Lacerations: None Suture Repair: none Est. Blood Loss (mL): 200   In to assess patient with bulging membranes. AROM done, with light meconium stained fluid noted. Immediately after AROM, brow presentation noted. Dr. Alysia Penna called into room to confirm presentation. Patient with plan to get epidural, but with next contraction fetus descended to +2 with face presentation.   With next tow contractions, and good maternal efforts, head delivered with face presentation with OP, and shoulders and body easily delivered in usual fashion. Infant with spontaneous cry, placed on mother's abdomen, and dried. Cord clamped x 2 after 1-minute delay, and cut by family member. Cord blood drawn. Placenta delivered spontaneously with gentle cord traction. Fundus firm with massage and Pitocin. Perineum inspected and found to have no laceration.  Mom to postpartum.  Baby to Couplet care / Skin to Skin.  Raynelle Fanning P. Katelin Kutsch, MD OB Fellow 07/10/17, 9:41 PM

## 2016-10-03 NOTE — L&D Delivery Note (Signed)
Dr. Valentino Saxonherry given an update on pt at this time new orders given.

## 2016-10-03 NOTE — L&D Delivery Note (Signed)
Dr. Valentino Saxonherry present to talk with pt about plan of care.  At this time pt agrees with plan.

## 2016-10-03 NOTE — L&D Delivery Note (Signed)
Dr. Valentino Saxonherry notified of pt arrival on the unit and pt status.   New orders given at this time

## 2016-11-07 ENCOUNTER — Encounter: Payer: Self-pay | Admitting: Emergency Medicine

## 2016-11-07 ENCOUNTER — Emergency Department
Admission: EM | Admit: 2016-11-07 | Discharge: 2016-11-07 | Disposition: A | Discharge status: Home / Self Care | Payer: Self-pay | Attending: Emergency Medicine | Admitting: Emergency Medicine

## 2016-11-07 ENCOUNTER — Emergency Department: Payer: Self-pay

## 2016-11-07 DIAGNOSIS — N760 Acute vaginitis: Secondary | ICD-10-CM

## 2016-11-07 DIAGNOSIS — O200 Threatened abortion: Secondary | ICD-10-CM

## 2016-11-07 DIAGNOSIS — O23591 Infection of other part of genital tract in pregnancy, first trimester: Secondary | ICD-10-CM | POA: Insufficient documentation

## 2016-11-07 DIAGNOSIS — O99331 Smoking (tobacco) complicating pregnancy, first trimester: Secondary | ICD-10-CM | POA: Insufficient documentation

## 2016-11-07 DIAGNOSIS — F17210 Nicotine dependence, cigarettes, uncomplicated: Secondary | ICD-10-CM | POA: Insufficient documentation

## 2016-11-07 DIAGNOSIS — N3000 Acute cystitis without hematuria: Secondary | ICD-10-CM

## 2016-11-07 DIAGNOSIS — B9689 Other specified bacterial agents as the cause of diseases classified elsewhere: Secondary | ICD-10-CM

## 2016-11-07 DIAGNOSIS — O2311 Infections of bladder in pregnancy, first trimester: Secondary | ICD-10-CM | POA: Insufficient documentation

## 2016-11-07 DIAGNOSIS — Z3A14 14 weeks gestation of pregnancy: Secondary | ICD-10-CM | POA: Insufficient documentation

## 2016-11-07 LAB — CHLAMYDIA/NGC RT PCR (ARMC ONLY)
CHLAMYDIA TR: NOT DETECTED
N GONORRHOEAE: NOT DETECTED

## 2016-11-07 LAB — WET PREP, GENITAL
Trich, Wet Prep: NONE SEEN
Yeast Wet Prep HPF POC: NONE SEEN

## 2016-11-07 LAB — URINALYSIS, COMPLETE (UACMP) WITH MICROSCOPIC
BILIRUBIN URINE: NEGATIVE
Glucose, UA: NEGATIVE mg/dL
Ketones, ur: NEGATIVE mg/dL
Nitrite: POSITIVE — AB
PROTEIN: 30 mg/dL — AB
Specific Gravity, Urine: 1.02 (ref 1.005–1.030)
pH: 7 (ref 5.0–8.0)

## 2016-11-07 LAB — HCG, QUANTITATIVE, PREGNANCY: hCG, Beta Chain, Quant, S: 16213 m[IU]/mL — ABNORMAL HIGH (ref <5)

## 2016-11-07 LAB — POCT PREGNANCY, URINE: PREG TEST UR: POSITIVE — AB

## 2016-11-07 LAB — OB RESULTS CONSOLE GBS: STREP GROUP B AG: POSITIVE

## 2016-11-07 MED ORDER — CEPHALEXIN 500 MG PO CAPS
500.0000 mg | ORAL_CAPSULE | Freq: Once | ORAL | Status: AC
  Administered 2016-11-07: 500 mg via ORAL
  Filled 2016-11-07: qty 1

## 2016-11-07 MED ORDER — CEPHALEXIN 500 MG PO CAPS: 500.0000 mg | ORAL_CAPSULE | Freq: Three times a day (TID) | ORAL | 0 refills | Status: AC

## 2016-11-07 NOTE — ED Notes (Signed)
Pt states she knows she is pregnant but unsure of how far along, states she has had some vaginal spotting but began heavily bleeding this AM, has not seen obgyn, pt awake and alert in no acute distress

## 2016-11-07 NOTE — ED Provider Notes (Signed)
[facility] Emergency Department Provider Note  ____________________________________________   First MD Initiated Contact with Patient 11/07/16 1227     (approximate)  I have reviewed the triage vital signs and the nursing notes.   HISTORY  Chief Complaint Vaginal Bleeding; Abdominal Cramping; and Possible Pregnancy   HPI Natalie Riggs is a 26 y.o. female with a history of a miscarriage and previous pregnancy who is presenting with vaginal bleeding that is off and on over the past several days. She says that she is also having burning upon urination over the past week and lower abdominal cramping which started this morning. She says that her pain as a 6 or 7 out of 10 and does not report any radiation. She describes the pain as a cramping type pain. She says that she is also been having some discharge. She is most concerned about miscarriage.  Says that her last. Was in late December.   Past Medical History:  Diagnosis Date  . Miscarriage     There are no active problems to display for this patient.   Past Surgical History:  Procedure Laterality Date  . DILATATION & CURETTAGE/HYSTEROSCOPY WITH MYOSURE      Prior to Admission medications   Medication Sig Start Date End Date Taking? Authorizing Provider  acetaminophen (TYLENOL) 500 MG tablet Take 2 tablets (1,000 mg total) by mouth every 6 (six) hours as needed for mild pain. 08/18/16   Lyndal Pulley, MD  ibuprofen (ADVIL,MOTRIN) 800 MG tablet Take 1 tablet (800 mg total) by mouth every 6 (six) hours as needed for moderate pain. 08/18/16   Lyndal Pulley, MD  polyethylene glycol powder (MIRALAX) powder TAKE 6 CAPFULS OF MIRALAX IN A 32 OUNCE GATORADE AND DRINK THE WHOLE BEVERAGE FOLLOWED BY 3 CAPFULS TWICE A DAY FOR THE NEXT WEEK AND FOLLOW UP WITH YOUR PRIMARY CARE PHYSICIAN. 08/18/16   Lyndal Pulley, MD  promethazine (PHENERGAN) 25 MG tablet Take 1 tablet (25 mg total) by mouth every 6 (six) hours as  needed for nausea or vomiting. 06/25/15   Minna Antis, MD    Allergies Patient has no known allergies.  No family history on file.  Social History Social History  Substance Use Topics  . Smoking status: Current Every Day Smoker    Packs/day: 0.50    Types: Cigarettes  . Smokeless tobacco: Never Used  . Alcohol use No    Review of Systems Constitutional: No fever/chills Eyes: No visual changes. ENT: No sore throat. Cardiovascular: Denies chest pain. Respiratory: Denies shortness of breath. Gastrointestinal:  No nausea, no vomiting.  No diarrhea.  No constipation. Genitourinary: dysuria Musculoskeletal: Negative for back pain. Skin: Negative for rash. Neurological: Negative for headaches, focal weakness or numbness.  10-point ROS otherwise negative.  ____________________________________________   PHYSICAL EXAM:  VITAL SIGNS: ED Triage Vitals [11/07/16 1131]  Enc Vitals Group     BP 135/80     Pulse Rate (!) 106     Resp 20     Temp 98.5 F (36.9 C)     Temp Source Oral     SpO2 97 %     Weight 160 lb (72.6 kg)     Height 5\' 8"  (1.727 m)     Head Circumference      Peak Flow      Pain Score 7     Pain Loc      Pain Edu?      Excl. in GC?     Constitutional: Alert  and oriented. Well appearing and in no acute distress. Eyes: Conjunctivae are normal. PERRL. EOMI. Head: Atraumatic. Nose: No congestion/rhinnorhea. Mouth/Throat: Mucous membranes are moist.  Neck: No stridor.   Cardiovascular: Normal rate, regular rhythm. Grossly normal heart sounds.   Respiratory: Normal respiratory effort.  No retractions. Lungs CTAB. Gastrointestinal: Soft with mild suprapubic ttp. No distention. Genitourinary:  Normal external exam. Speculum exam with small amount of blood in the vagina without any active bleeding from the cervix. Bimanual exam with a closed cervical os. No tenderness to the uterus or the bilateral adnexa without any masses palpated.     Musculoskeletal: No lower extremity tenderness nor edema.  No joint effusions. Neurologic:  Normal speech and language. No gross focal neurologic deficits are appreciated.  Skin:  Skin is warm, dry and intact. No rash noted. Psychiatric: Mood and affect are normal. Speech and behavior are normal.  ____________________________________________   LABS (all labs ordered are listed, but only abnormal results are displayed)  Labs Reviewed  WET PREP, GENITAL - Abnormal; Notable for the following:       Result Value   Clue Cells Wet Prep HPF POC PRESENT (*)    WBC, Wet Prep HPF POC MODERATE (*)    All other components within normal limits  HCG, QUANTITATIVE, PREGNANCY - Abnormal; Notable for the following:    hCG, Beta Chain, Quant, S 16,213 (*)    All other components within normal limits  URINALYSIS, COMPLETE (UACMP) WITH MICROSCOPIC - Abnormal; Notable for the following:    Color, Urine YELLOW (*)    APPearance CLOUDY (*)    Hgb urine dipstick LARGE (*)    Protein, ur 30 (*)    Nitrite POSITIVE (*)    Leukocytes, UA MODERATE (*)    Bacteria, UA FEW (*)    Squamous Epithelial / LPF 0-5 (*)    All other components within normal limits  POCT PREGNANCY, URINE - Abnormal; Notable for the following:    Preg Test, Ur POSITIVE (*)    All other components within normal limits  CHLAMYDIA/NGC RT PCR ([facility] ONLY)  URINE CULTURE  POC URINE PREG, ED   ____________________________________________  EKG   ____________________________________________  RADIOLOGY  US OB Transvaginal (Final result)  Result time 11/07/16 13:52:33  Final result by Sebastian Ache, MD (11/07/16 13:52:33)           Narrative:   CLINICAL DATA: Vaginal bleeding and cramping.  EXAM: OBSTETRIC <14 WK [place] AND TRANSVAGINAL OB US  TECHNIQUE: Both transabdominal and transvaginal ultrasound examinations were performed for complete evaluation of the gestation as well as the maternal uterus, adnexal  regions, and pelvic cul-de-sac. Transvaginal technique was performed to assess early pregnancy.  COMPARISON: 08/18/2016 pelvic ultrasound  FINDINGS: Intrauterine gestational sac: Single.  Yolk sac: Visualized.  Embryo: Not visualized  MSD: 11.3 mm  5 w  6 d  Subchorionic hemorrhage: None visualized.  Maternal uterus/adnexae: No adnexal mass. Ovaries measure 3.5 x 2.2 x 2.7 cm on the right and 2.9 x 1.6 x 2.1 cm on the left. No free fluid.  IMPRESSION: Early intrauterine gestation with a yolk sac present but no fetal pole yet identified.   Electronically Signed By: Sebastian Ache M.D. On: 11/07/2016 13:52            US OB Comp Less 14 Wks (Final result)  Result time 11/07/16 13:52:33  Final result by Sebastian Ache, MD (11/07/16 13:52:33)           Narrative:   CLINICAL DATA:  Vaginal bleeding and cramping.  EXAM: OBSTETRIC <14 WK US AND TRANSVAGINAL OB US  TECHNIQUE: Both transabdominal and transvaginal ultrasound examinations were performed for complete evaluation of the gestation as well as the maternal uterus, adnexal regions, and pelvic cul-de-sac. Transvaginal technique was performed to assess early pregnancy.  COMPARISON: 08/18/2016 pelvic ultrasound  FINDINGS: Intrauterine gestational sac: Single.  Yolk sac: Visualized.  Embryo: Not visualized  MSD: 11.3 mm  5 w  6 d  Subchorionic hemorrhage: None visualized.  Maternal uterus/adnexae: No adnexal mass. Ovaries measure 3.5 x 2.2 x 2.7 cm on the right and 2.9 x 1.6 x 2.1 cm on the left. No free fluid.  IMPRESSION: Early intrauterine gestation with a yolk sac present but no fetal pole yet identified.   Electronically Signed By: Sebastian AcheAllen Grady M.D. On: 11/07/2016 13:52            ____________________________________________   PROCEDURES  Procedure(s) performed:   Procedures  Critical Care performed:    ____________________________________________   INITIAL IMPRESSION / ASSESSMENT AND PLAN / ED COURSE  Pertinent labs & imaging results that were available during my care of the patient were reviewed by me and considered in my medical decision making (see chart for details).  ----------------------------------------- 3:25 PM on 11/07/2016 -----------------------------------------  Patient resting without any distress at this time. Reassuring all percent with an intrauterine pregnancy. Patient appears to have bacterial vaginosis. I discussed this with Dr. Brennan Baileyavid Evans of [facility] where the patient will be following. He recommends that unless the patient is having severe case of bacterial vaginosis to withhold Flagyl until after the first trimester. The patient only has discharge but did not report any itching or intolerable fell over. We will treat her urinary tract infection. She'll be discharged to home. She is already taking prenatal vitamins. We discussed pelvic rest and the patient is understanding of this and wanted to comply.      ____________________________________________   FINAL CLINICAL IMPRESSION(S) / ED DIAGNOSES  Threatened abortion. Bacterial vaginosis. urinary tract infection.    NEW MEDICATIONS STARTED DURING THIS VISIT:  New Prescriptions   No medications on file     Note:  This document was prepared using Dragon voice recognition software and may include unintentional dictation errors.    Myrna Blazeravid Matthew Kash [place], MD 11/07/16 [phone]

## 2016-11-07 NOTE — ED Triage Notes (Signed)
Pt with abd cramping and vaginal bleeding for 2-3 days, states is bleeding heavy and is going through one pad per hour. Pt thinks she is about 5 weeks. LMP 07/30/16. [facility] of pregnancy.

## 2016-11-09 ENCOUNTER — Telehealth: Payer: Self-pay | Admitting: Emergency Medicine

## 2016-11-09 LAB — URINE CULTURE: Culture: >=100000 — AB

## 2016-11-09 NOTE — Telephone Encounter (Signed)
Received result from lab group b strep on urine culture.  Patient was referred to dr Logan Boresevans.  Called office and left message asking them to call me.

## 2016-11-10 ENCOUNTER — Telehealth: Payer: Self-pay

## 2016-11-10 NOTE — Telephone Encounter (Signed)
Dr Cristal GenerousScaevitzs note states he would treat UTI- no documentation seen. After speaking with E[facility] G[facility] at [facility] - it was determined a script for cefalexin 500mg  #21 I tab TID was given to patient.

## 2016-11-10 NOTE — Telephone Encounter (Signed)
I spoke with Natalie Riggs at [facility] ED regarding this patient. Her group B strep culture came back positive and Ms Natalie Riggs stated you have been in contact with an ED provider regarding this. Patient has not made an new OB appointment yet. FYI

## 2016-11-10 NOTE — Telephone Encounter (Signed)
Attempted to contact patient- mailbox is not set up yet to take messages. If patient calls back please set up a NOB appointment for her. See message dated 11/10/16.

## 2016-11-14 NOTE — Telephone Encounter (Signed)
Unable to reach patient due to no answer and voice mailbox not being set up. Pt has not set up a new OB appt.

## 2016-11-15 NOTE — Telephone Encounter (Signed)
Unable to reach letter sent to patient to please call office to set up new OB appointment.

## 2016-11-27 ENCOUNTER — Encounter (HOSPITAL_COMMUNITY): Payer: Self-pay | Admitting: Emergency Medicine

## 2016-11-27 ENCOUNTER — Emergency Department (HOSPITAL_COMMUNITY)
Admission: EM | Admit: 2016-11-27 | Discharge: 2016-11-27 | Disposition: A | Discharge status: Home / Self Care | Payer: Self-pay | Attending: Emergency Medicine | Admitting: Emergency Medicine

## 2016-11-27 DIAGNOSIS — Z3A08 8 weeks gestation of pregnancy: Secondary | ICD-10-CM | POA: Insufficient documentation

## 2016-11-27 DIAGNOSIS — O210 Mild hyperemesis gravidarum: Secondary | ICD-10-CM | POA: Insufficient documentation

## 2016-11-27 DIAGNOSIS — O99330 Smoking (tobacco) complicating pregnancy, unspecified trimester: Secondary | ICD-10-CM | POA: Insufficient documentation

## 2016-11-27 DIAGNOSIS — Z79899 Other long term (current) drug therapy: Secondary | ICD-10-CM | POA: Insufficient documentation

## 2016-11-27 DIAGNOSIS — F17210 Nicotine dependence, cigarettes, uncomplicated: Secondary | ICD-10-CM | POA: Insufficient documentation

## 2016-11-27 LAB — COMPREHENSIVE METABOLIC PANEL
ALBUMIN: 3.7 g/dL (ref 3.5–5.0)
ALK PHOS: 40 U/L (ref 38–126)
ALT: 18 U/L (ref 14–54)
AST: 20 U/L (ref 15–41)
Anion gap: 6 (ref 5–15)
BILIRUBIN TOTAL: 0.6 mg/dL (ref 0.3–1.2)
CALCIUM: 9 mg/dL (ref 8.9–10.3)
CO2: 23 mmol/L (ref 22–32)
CREATININE: 0.55 mg/dL (ref 0.44–1.00)
Chloride: 105 mmol/L (ref 101–111)
GFR calc Af Amer: >60 mL/min (ref >60)
GFR calc non Af Amer: >60 mL/min (ref >60)
GLUCOSE: 89 mg/dL (ref 65–99)
Potassium: 3.6 mmol/L (ref 3.5–5.1)
Sodium: 134 mmol/L — ABNORMAL LOW (ref 135–145)
Total Protein: 7.6 g/dL (ref 6.5–8.1)

## 2016-11-27 LAB — URINALYSIS, ROUTINE W REFLEX MICROSCOPIC
BILIRUBIN URINE: NEGATIVE
Glucose, UA: NEGATIVE mg/dL
Hgb urine dipstick: NEGATIVE
KETONES UR: 80 mg/dL — AB
LEUKOCYTES UA: NEGATIVE
NITRITE: NEGATIVE
PH: 7 (ref 5.0–8.0)
PROTEIN: NEGATIVE mg/dL
Specific Gravity, Urine: 1.018 (ref 1.005–1.030)

## 2016-11-27 LAB — CBC
HCT: 33.4 % — ABNORMAL LOW (ref 36.0–46.0)
Hemoglobin: 11.5 g/dL — ABNORMAL LOW (ref 12.0–15.0)
MCH: 29.6 pg (ref 26.0–34.0)
MCHC: 34.4 g/dL (ref 30.0–36.0)
MCV: 86.1 fL (ref 78.0–100.0)
PLATELETS: 233 10*3/uL (ref 150–400)
RBC: 3.88 MIL/uL (ref 3.87–5.11)
RDW: 12.7 % (ref 11.5–15.5)
WBC: 8 10*3/uL (ref 4.0–10.5)

## 2016-11-27 LAB — LIPASE, BLOOD: Lipase: 15 U/L (ref 11–51)

## 2016-11-27 MED ORDER — SODIUM CHLORIDE 0.9 % IV BOLUS (SEPSIS)
1000.0000 mL | Freq: Once | INTRAVENOUS | Status: AC
  Administered 2016-11-27: 1000 mL via INTRAVENOUS

## 2016-11-27 MED ORDER — ONDANSETRON 8 MG PO TBDP: 8.0000 mg | ORAL_TABLET | Freq: Three times a day (TID) | ORAL | 0 refills | Status: DC | PRN

## 2016-11-27 MED ORDER — ONDANSETRON HCL 40 MG/20ML IJ SOLN
8.0000 mg | Freq: Once | INTRAMUSCULAR | Status: AC
  Administered 2016-11-27: 8 mg via INTRAVENOUS
  Filled 2016-11-27: qty 4

## 2016-11-27 NOTE — ED Triage Notes (Signed)
Pt sts vomiting from morning sickness; pt sts currently [redacted] weeks pregnant; pt G3 P2; pt sts right breat pain also

## 2016-11-27 NOTE — ED Notes (Signed)
Pt given gingerale. Reports she is still extremely nauseated.

## 2016-11-27 NOTE — ED Provider Notes (Signed)
[facility]-EMERGENCY DEPT Provider Note   CSN: [id number] Arrival date & time: 11/27/16 1235     History    Chief Complaint  Patient presents with  . Emesis     HPI Natalie Riggs is a 26 y.o. female.  25yo F G3P2 at ~8 weeks who p/w vomiting. Pt reports problems with morning sickness during this pregnancy. She states she is constantly nauseated and has vomiting. She also reports diarrhea and cold sweats. She denies any fevers or sick contacts. No urinary symptoms, vaginal bleeding/discharge. She also reports 3-4 weeks of right breast pain that wraps around the right side of her chest. Her right breast is very sore. She denies any redness or skin changes. She has not taken any medication for her symptoms. She has not had an OB appointment yet. She also reports feeling generally anxious.   Past Medical History:  Diagnosis Date  . Miscarriage      There are no active problems to display for this patient.   Past Surgical History:  Procedure Laterality Date  . DILATATION & CURETTAGE/HYSTEROSCOPY WITH MYOSURE      OB History    Gravida Para Term Preterm AB Living   1             SAB TAB Ectopic Multiple Live Births                    Home Medications    Prior to Admission medications   Medication Sig Start Date End Date Taking? Authorizing Provider  acetaminophen (TYLENOL) 500 MG tablet Take 2 tablets (1,000 mg total) by mouth every 6 (six) hours as needed for mild pain. Patient not taking: Reported on 11/27/2016 08/18/16   Lyndal Pulley, MD  ibuprofen (ADVIL,MOTRIN) 800 MG tablet Take 1 tablet (800 mg total) by mouth every 6 (six) hours as needed for moderate pain. Patient not taking: Reported on 11/27/2016 08/18/16   Lyndal Pulley, MD  ondansetron (ZOFRAN ODT) 8 MG disintegrating tablet Take 1 tablet (8 mg total) by mouth every 8 (eight) hours as needed for nausea or vomiting. 11/27/16   Laurence Spates, MD  polyethylene glycol powder (MIRALAX) powder TAKE 6 CAPFULS OF  MIRALAX IN A 32 OUNCE GATORADE AND DRINK THE WHOLE BEVERAGE FOLLOWED BY 3 CAPFULS TWICE A DAY FOR THE NEXT WEEK AND FOLLOW UP WITH YOUR PRIMARY CARE PHYSICIAN. Patient not taking: Reported on 11/27/2016 08/18/16   Lyndal Pulley, MD  promethazine (PHENERGAN) 25 MG tablet Take 1 tablet (25 mg total) by mouth every 6 (six) hours as needed for nausea or vomiting. Patient not taking: Reported on 11/27/2016 06/25/15   Minna Antis, MD      History reviewed. No pertinent family history.   Social History  Substance Use Topics  . Smoking status: Current Every Day Smoker    Packs/day: 0.50    Types: Cigarettes  . Smokeless tobacco: Never Used  . Alcohol use No     Allergies     Patient has no known allergies.    Review of Systems  10 Systems reviewed and are negative for acute change except as noted in the HPI.   Physical Exam Updated Vital Signs BP 118/64   Pulse 73   Temp 97.9 F (36.6 C) (Oral)   Resp 16   Ht 5\' 8"  (1.727 m)   Wt 160 lb (72.6 kg)   LMP 07/30/2016   SpO2 99%   BMI 24.33 kg/m   Physical Exam  Constitutional: She is oriented  to person, place, and time. She appears well-developed and well-nourished. No distress.  Uncomfortable  HENT:  Head: Normocephalic and atraumatic.  Moist mucous membranes  Eyes: Conjunctivae are normal. Pupils are equal, round, and reactive to light.  Neck: Neck supple.  Cardiovascular: Normal rate, regular rhythm and normal heart sounds.   No murmur heard. Pulmonary/Chest: Effort normal and breath sounds normal.  Abdominal: Soft. Bowel sounds are normal. She exhibits no distension. There is no tenderness.  Musculoskeletal: She exhibits no edema.  Neurological: She is alert and oriented to person, place, and time.  Fluent speech  Skin: Skin is warm and dry.  No swelling, redness, induration, fluctuance, or focal tenderness of right breast, no nipple discharge  Psychiatric: Judgment normal.  Anxious  Nursing note and vitals  reviewed.     ED Treatments / Results  Labs (all labs ordered are listed, but only abnormal results are displayed) Labs Reviewed  COMPREHENSIVE METABOLIC PANEL - Abnormal; Notable for the following:       Result Value   Sodium 134 (*)    BUN <5 (*)    All other components within normal limits  CBC - Abnormal; Notable for the following:    Hemoglobin 11.5 (*)    HCT 33.4 (*)    All other components within normal limits  URINALYSIS, ROUTINE W REFLEX MICROSCOPIC - Abnormal; Notable for the following:    Ketones, ur 80 (*)    All other components within normal limits  LIPASE, BLOOD     EKG  EKG Interpretation  Date/Time:    Ventricular Rate:    PR Interval:    QRS Duration:   QT Interval:    QTC Calculation:   R Axis:     Text Interpretation:           Radiology No results found.  Procedures Procedures (including critical care time) Procedures  Medications Ordered in ED  Medications  ondansetron (ZOFRAN) 8 mg in sodium chloride 0.9 % 50 mL IVPB (8 mg Intravenous Given 11/27/16 1646)  sodium chloride 0.9 % bolus 1,000 mL (0 mLs Intravenous Stopped 11/27/16 1750)     Initial Impression / Assessment and Plan / ED Course  I have reviewed the triage vital signs and the nursing notes.  Pertinent labs  that were available during my care of the patient were reviewed by me and considered in my medical decision making (see chart for details).     Patient with persistent nausea and vomiting consistent with morning sickness during her first trimester of pregnancy. No abdominal pain or vaginal bleeding/discharge. She was uncomfortable but nontoxic on exam with reassuring vital signs. No abdominal tenderness or complaints of vaginal discharge or bleeding. Her lab work shows mild ketones in urine but otherwise unremarkable. Description of symptoms is consistent with hyperemesis gravidarum. His chest risks and benefits of treatment with medications and patient voiced  understanding. Gave IV fluid bolus and Zofran after which patient was able to tolerate fluids with no vomiting in the ED. She mentioned right breast pain but has no signs or symptoms of breast infection and no asymmetry. I suspect hormone-related changes. Provided patient with Zofran to use as needed at home after discussion of risks and benefits. Instructed patient to follow-up with OB/GYN. Reviewed return precautions. Patient voiced understanding was discharged in satisfactory condition.  Final Clinical Impressions(s) / ED Diagnoses   Final diagnoses:  Hyperemesis arising during pregnancy     Discharge Medication List as of 11/27/2016  8:15 PM  START taking these medications   Details  ondansetron (ZOFRAN ODT) 8 MG disintegrating tablet Take 1 tablet (8 mg total) by mouth every 8 (eight) hours as needed for nausea or vomiting., Starting Sun 11/27/2016, Print           Laurence Spates, MD 11/27/16 [id number]

## 2016-12-25 ENCOUNTER — Encounter (HOSPITAL_COMMUNITY): Payer: Self-pay | Admitting: Emergency Medicine

## 2016-12-25 ENCOUNTER — Emergency Department (HOSPITAL_COMMUNITY): Payer: Medicaid Other

## 2016-12-25 ENCOUNTER — Inpatient Hospital Stay (HOSPITAL_COMMUNITY)
Admission: EM | Admit: 2016-12-25 | Discharge: 2016-12-30 | DRG: 781 | Disposition: A | Discharge status: Home / Self Care | Payer: Medicaid Other | Attending: Obstetrics & Gynecology | Admitting: Obstetrics & Gynecology

## 2016-12-25 DIAGNOSIS — O210 Mild hyperemesis gravidarum: Secondary | ICD-10-CM | POA: Diagnosis present

## 2016-12-25 DIAGNOSIS — O99331 Smoking (tobacco) complicating pregnancy, first trimester: Secondary | ICD-10-CM | POA: Diagnosis present

## 2016-12-25 DIAGNOSIS — R05 Cough: Secondary | ICD-10-CM | POA: Diagnosis present

## 2016-12-25 DIAGNOSIS — F17210 Nicotine dependence, cigarettes, uncomplicated: Secondary | ICD-10-CM | POA: Diagnosis present

## 2016-12-25 DIAGNOSIS — Z3A13 13 weeks gestation of pregnancy: Secondary | ICD-10-CM | POA: Diagnosis not present

## 2016-12-25 DIAGNOSIS — J209 Acute bronchitis, unspecified: Secondary | ICD-10-CM | POA: Diagnosis present

## 2016-12-25 DIAGNOSIS — J189 Pneumonia, unspecified organism: Secondary | ICD-10-CM | POA: Diagnosis present

## 2016-12-25 DIAGNOSIS — R7401 Elevation of levels of liver transaminase levels: Secondary | ICD-10-CM

## 2016-12-25 DIAGNOSIS — R740 Nonspecific elevation of levels of transaminase and lactic acid dehydrogenase [LDH]: Secondary | ICD-10-CM

## 2016-12-25 DIAGNOSIS — O99511 Diseases of the respiratory system complicating pregnancy, first trimester: Secondary | ICD-10-CM | POA: Diagnosis present

## 2016-12-25 DIAGNOSIS — J069 Acute upper respiratory infection, unspecified: Secondary | ICD-10-CM | POA: Diagnosis present

## 2016-12-25 DIAGNOSIS — E876 Hypokalemia: Secondary | ICD-10-CM | POA: Diagnosis present

## 2016-12-25 LAB — URINALYSIS, ROUTINE W REFLEX MICROSCOPIC
BACTERIA UA: NONE SEEN
Bilirubin Urine: NEGATIVE
Glucose, UA: NEGATIVE mg/dL
Hgb urine dipstick: NEGATIVE
KETONES UR: 5 mg/dL — AB
Nitrite: NEGATIVE
PH: 7 (ref 5.0–8.0)
Protein, ur: NEGATIVE mg/dL
SPECIFIC GRAVITY, URINE: 1.005 (ref 1.005–1.030)

## 2016-12-25 LAB — COMPREHENSIVE METABOLIC PANEL
ALBUMIN: 3.5 g/dL (ref 3.5–5.0)
ALT: 79 U/L — ABNORMAL HIGH (ref 14–54)
AST: 50 U/L — AB (ref 15–41)
Alkaline Phosphatase: 70 U/L (ref 38–126)
Anion gap: 12 (ref 5–15)
BUN: 9 mg/dL (ref 6–20)
CHLORIDE: 99 mmol/L — AB (ref 101–111)
CO2: 24 mmol/L (ref 22–32)
Calcium: 9.3 mg/dL (ref 8.9–10.3)
Creatinine, Ser: 0.62 mg/dL (ref 0.44–1.00)
GFR calc Af Amer: >60 mL/min (ref >60)
GFR calc non Af Amer: >60 mL/min (ref >60)
GLUCOSE: 96 mg/dL (ref 65–99)
POTASSIUM: 2.9 mmol/L — AB (ref 3.5–5.1)
Sodium: 135 mmol/L (ref 135–145)
Total Bilirubin: 1.3 mg/dL — ABNORMAL HIGH (ref 0.3–1.2)
Total Protein: 8.3 g/dL — ABNORMAL HIGH (ref 6.5–8.1)

## 2016-12-25 LAB — CBC
HEMATOCRIT: 36.8 % (ref 36.0–46.0)
Hemoglobin: 13.2 g/dL (ref 12.0–15.0)
MCH: 30.1 pg (ref 26.0–34.0)
MCHC: 35.9 g/dL (ref 30.0–36.0)
MCV: 83.8 fL (ref 78.0–100.0)
Platelets: 234 10*3/uL (ref 150–400)
RBC: 4.39 MIL/uL (ref 3.87–5.11)
RDW: 11.8 % (ref 11.5–15.5)
WBC: 7.8 10*3/uL (ref 4.0–10.5)

## 2016-12-25 LAB — I-STAT BETA HCG BLOOD, ED (MC, WL, AP ONLY): I-stat hCG, quantitative: >2000 m[IU]/mL — ABNORMAL HIGH (ref <5)

## 2016-12-25 LAB — TYPE AND SCREEN
ABO/RH(D): B POS
ANTIBODY SCREEN: NEGATIVE

## 2016-12-25 LAB — INFLUENZA PANEL BY PCR (TYPE A & B)
INFLAPCR: NEGATIVE
Influenza B By PCR: NEGATIVE

## 2016-12-25 LAB — I-STAT CG4 LACTIC ACID, ED: Lactic Acid, Venous: 1.81 mmol/L (ref 0.5–1.9)

## 2016-12-25 LAB — LIPASE, BLOOD: LIPASE: 27 U/L (ref 11–51)

## 2016-12-25 MED ORDER — SODIUM CHLORIDE 0.9 % IV SOLN
30.0000 meq | Freq: Once | INTRAVENOUS | Status: AC
  Administered 2016-12-25: 30 meq via INTRAVENOUS
  Filled 2016-12-25: qty 15

## 2016-12-25 MED ORDER — POTASSIUM PHOSPHATES 15 MMOLE/5ML IV SOLN
40.0000 meq | Freq: Once | INTRAVENOUS | Status: DC
  Filled 2016-12-25: qty 9.09

## 2016-12-25 MED ORDER — ZOLPIDEM TARTRATE 5 MG PO TABS
5.0000 mg | ORAL_TABLET | Freq: Every evening | ORAL | Status: DC | PRN
  Administered 2016-12-25: 5 mg via ORAL
  Filled 2016-12-25: qty 1

## 2016-12-25 MED ORDER — DEXTROSE 5 % IV SOLN
2.0000 g | Freq: Once | INTRAVENOUS | Status: AC
  Administered 2016-12-25: 2 g via INTRAVENOUS
  Filled 2016-12-25: qty 2

## 2016-12-25 MED ORDER — LACTATED RINGERS IV BOLUS (SEPSIS)
1000.0000 mL | Freq: Once | INTRAVENOUS | Status: AC
  Administered 2016-12-25: 1000 mL via INTRAVENOUS

## 2016-12-25 MED ORDER — DIPHENHYDRAMINE HCL 50 MG/ML IJ SOLN
25.0000 mg | Freq: Once | INTRAMUSCULAR | Status: AC
  Administered 2016-12-25: 25 mg via INTRAVENOUS
  Filled 2016-12-25: qty 1

## 2016-12-25 MED ORDER — PRENATAL MULTIVITAMIN CH
1.0000 | ORAL_TABLET | Freq: Every day | ORAL | Status: DC
  Administered 2016-12-28 – 2016-12-30 (×3): 1 via ORAL
  Filled 2016-12-25 (×3): qty 1

## 2016-12-25 MED ORDER — METOCLOPRAMIDE HCL 5 MG/ML IJ SOLN
10.0000 mg | Freq: Once | INTRAMUSCULAR | Status: AC
  Administered 2016-12-25: 10 mg via INTRAVENOUS
  Filled 2016-12-25: qty 2

## 2016-12-25 MED ORDER — PROMETHAZINE HCL 25 MG/ML IJ SOLN
12.5000 mg | Freq: Four times a day (QID) | INTRAMUSCULAR | Status: DC | PRN
  Administered 2016-12-25 – 2016-12-27 (×3): 12.5 mg via INTRAVENOUS
  Filled 2016-12-25 (×3): qty 1

## 2016-12-25 MED ORDER — IPRATROPIUM-ALBUTEROL 20-100 MCG/ACT IN AERS
1.0000 | INHALATION_SPRAY | Freq: Four times a day (QID) | RESPIRATORY_TRACT | Status: DC | PRN
  Filled 2016-12-25: qty 4

## 2016-12-25 MED ORDER — DOCUSATE SODIUM 100 MG PO CAPS: 100.0000 mg | ORAL_CAPSULE | Freq: Every day | ORAL | Status: DC

## 2016-12-25 MED ORDER — KCL IN DEXTROSE-NACL 20-5-0.45 MEQ/L-%-% IV SOLN
INTRAVENOUS | Status: DC
  Administered 2016-12-25 – 2016-12-26 (×2): via INTRAVENOUS
  Filled 2016-12-25 (×2): qty 1000

## 2016-12-25 MED ORDER — BENZONATATE 100 MG PO CAPS
200.0000 mg | ORAL_CAPSULE | Freq: Three times a day (TID) | ORAL | Status: DC | PRN
  Administered 2016-12-26: 200 mg via ORAL
  Filled 2016-12-25 (×2): qty 2

## 2016-12-25 MED ORDER — ONDANSETRON HCL 4 MG/2ML IJ SOLN
4.0000 mg | Freq: Four times a day (QID) | INTRAMUSCULAR | Status: DC | PRN
  Administered 2016-12-25 – 2016-12-27 (×3): 4 mg via INTRAVENOUS
  Filled 2016-12-25 (×3): qty 2

## 2016-12-25 MED ORDER — LACTATED RINGERS IV SOLN
INTRAVENOUS | Status: DC
  Administered 2016-12-25: 21:00:00 via INTRAVENOUS

## 2016-12-25 MED ORDER — POTASSIUM CHLORIDE CRYS ER 20 MEQ PO TBCR
40.0000 meq | EXTENDED_RELEASE_TABLET | Freq: Once | ORAL | Status: DC
  Filled 2016-12-25: qty 2

## 2016-12-25 MED ORDER — DEXTROSE 5 % IV SOLN
500.0000 mg | INTRAVENOUS | Status: DC
  Administered 2016-12-25: 500 mg via INTRAVENOUS
  Filled 2016-12-25: qty 500

## 2016-12-25 MED ORDER — ONDANSETRON 4 MG PO TBDP
4.0000 mg | ORAL_TABLET | Freq: Four times a day (QID) | ORAL | Status: DC | PRN
  Filled 2016-12-25: qty 1

## 2016-12-25 MED ORDER — ACETAMINOPHEN 325 MG PO TABS
650.0000 mg | ORAL_TABLET | ORAL | Status: DC | PRN
Start: 2016-12-25 — End: 2016-12-30
  Administered 2016-12-29: 650 mg via ORAL
  Filled 2016-12-25: qty 2

## 2016-12-25 MED ORDER — CALCIUM CARBONATE ANTACID 500 MG PO CHEW: 2.0000 | CHEWABLE_TABLET | ORAL | Status: DC | PRN

## 2016-12-25 MED ORDER — LACTATED RINGERS IV SOLN
INTRAVENOUS | Status: DC
  Administered 2016-12-26: 07:00:00 via INTRAVENOUS

## 2016-12-25 MED ORDER — SODIUM CHLORIDE 0.9 % IV BOLUS (SEPSIS)
1000.0000 mL | Freq: Once | INTRAVENOUS | Status: AC
  Administered 2016-12-25: 1000 mL via INTRAVENOUS

## 2016-12-25 MED ORDER — IPRATROPIUM-ALBUTEROL 0.5-2.5 (3) MG/3ML IN SOLN
3.0000 mL | RESPIRATORY_TRACT | Status: DC
  Administered 2016-12-25: 3 mL via RESPIRATORY_TRACT
  Filled 2016-12-25 (×5): qty 3

## 2016-12-25 MED ORDER — CEFTRIAXONE SODIUM 2 G IJ SOLR
2.0000 g | INTRAMUSCULAR | Status: DC
  Filled 2016-12-25: qty 2

## 2016-12-25 NOTE — H&P (Signed)
ANTEPARTUM ADMISSION HISTORY AND PHYSICAL NOTE   History of Present Illness: Natalie Riggs is a 26 y.o. [phone] at [redacted]w[redacted]d admitted for cough, nausea and vomiting x 1 week.  She states that starting last Saturday (3/17), she started to have a cough accompanied by upper URI symptoms and chest discomfort.  She reports mild nausea and vomiting her entire pregnancy, but states that the day her coughing starting, her N/V increased.  She reports subjective fever x 3 days 3/19-3/21.  She denies abdominal pain, dysuria, hx of asthma, smoking history, sick contacts.  Patient reports the fetal movement as none this pregnancy. Patient reports uterine contraction  activity as none. Patient reports  vaginal bleeding as none. Patient describes fluid per vagina as None. Fetal presentation is too early.  Patient Active Problem List   Diagnosis Date Noted  . Hyperemesis 12/25/2016  . Hyperemesis affecting pregnancy, antepartum 12/25/2016  . Acute upper respiratory infection 12/25/2016  . Hypokalemia 12/25/2016    Past Medical History:  Diagnosis Date  . Miscarriage     Past Surgical History:  Procedure Laterality Date  . DILATATION & CURETTAGE/HYSTEROSCOPY WITH MYOSURE      OB History  Gravida Para Term Preterm AB Living  4 2 2   1 2   SAB TAB Ectopic Multiple Live Births  1       2    # Outcome Date GA Lbr Len/2nd Weight Sex Delivery Anes PTL Lv  4 Current           3 SAB 12/02/14          2 Term 10/20/10     Vag-Spont   LIV  1 Term 05/06/09     Vag-Spont   LIV      Social History   Social History  . Marital status: Single    Spouse name: N/A  . Number of children: N/A  . Years of education: N/A   Social History Main Topics  . Smoking status: Current Every Day Smoker    Packs/day: 0.50    Types: Cigarettes  . Smokeless tobacco: Never Used  . Alcohol use No  . Drug use: No  . Sexual activity: Not Asked   Other Topics Concern  . None   Social History Narrative  . None     No family history on file.  No Known Allergies  No prescriptions prior to admission.    Review of Systems - Negative except as mentioned in HPI  Vitals:  BP 111/82 (BP Location: Right Arm)   Pulse 74   Temp 98.5 F (36.9 C) (Oral)   Resp 17   Ht 5\' 8"  (1.727 m)   Wt 160 lb (72.6 kg)   LMP 07/30/2016   SpO2 100%   BMI 24.33 kg/m  Physical Examination: CONSTITUTIONAL: Well-developed, well-nourished female in no acute distress.  HENT:  Normocephalic, atraumatic. Oropharynx is clear and moist EYES: Conjunctivae and EOM are normal. Pupils are equal, round, and reactive to light. No scleral icterus.  NECK: Normal range of motion, supple, no masses SKIN: Skin is warm and dry. No rash noted. Not diaphoretic. No erythema. No pallor. NEUROLGIC: Alert and oriented to person, place, and time. Normal reflexes, muscle tone coordination. No cranial nerve deficit noted. PSYCHIATRIC: Normal mood and affect. Normal behavior. Normal judgment and thought content. CARDIOVASCULAR: Normal heart rate noted, regular rhythm RESPIRATORY: Course breath sounds throughout, good air movement, no wheezes or rales ABDOMEN: Soft, nontender, nondistended, gravid. MUSCULOSKELETAL: Normal range of motion. No edema  and no tenderness. 2+ distal pulses.  Cervix: Not evaluated.  Membranes:intact Fetal Monitoring:dop tones Q shift Tocometer: NA  Labs:  Results for orders placed or performed during the hospital encounter of 12/25/16 (from the past 24 hour(s))  Lipase, blood   Collection Time: 12/25/16  1:00 PM  Result Value Ref Range   Lipase 27 11 - 51 U/L  Comprehensive metabolic panel   Collection Time: 12/25/16  1:00 PM  Result Value Ref Range   Sodium 135 135 - 145 mmol/L   Potassium 2.9 (L) 3.5 - 5.1 mmol/L   Chloride 99 (L) 101 - 111 mmol/L   CO2 24 22 - 32 mmol/L   Glucose, Bld 96 65 - 99 mg/dL   BUN 9 6 - 20 mg/dL   Creatinine, Ser 1.610.62 0.44 - 1.00 mg/dL   Calcium 9.3 8.9 - 09.610.3 mg/dL    Total Protein 8.3 (H) 6.5 - 8.1 g/dL   Albumin 3.5 3.5 - 5.0 g/dL   AST 50 (H) 15 - 41 U/L   ALT 79 (H) 14 - 54 U/L   Alkaline Phosphatase 70 38 - 126 U/L   Total Bilirubin 1.3 (H) 0.3 - 1.2 mg/dL   GFR calc non Af Amer >60 >60 mL/min   GFR calc Af Amer >60 >60 mL/min   Anion gap 12 5 - 15  CBC   Collection Time: 12/25/16  1:00 PM  Result Value Ref Range   WBC 7.8 4.0 - 10.5 K/uL   RBC 4.39 3.87 - 5.11 MIL/uL   Hemoglobin 13.2 12.0 - 15.0 g/dL   HCT 04.536.8 40.936.0 - 81.146.0 %   MCV 83.8 78.0 - 100.0 fL   MCH 30.1 26.0 - 34.0 pg   MCHC 35.9 30.0 - 36.0 g/dL   RDW 91.411.8 78.211.5 - 95.615.5 %   Platelets 234 150 - 400 K/uL  Influenza panel by PCR (type A & B)   Collection Time: 12/25/16  2:19 PM  Result Value Ref Range   Influenza A By PCR NEGATIVE NEGATIVE   Influenza B By PCR NEGATIVE NEGATIVE  I-Stat Beta hCG blood, ED ([facility], [facility], [facility] only)   Collection Time: 12/25/16  2:37 PM  Result Value Ref Range   I-stat hCG, quantitative >2,000.0 (H) <5 mIU/mL   Comment 3          I-Stat CG4 Lactic Acid, ED   Collection Time: 12/25/16  3:22 PM  Result Value Ref Range   Lactic Acid, Venous 1.81 0.5 - 1.9 mmol/L  Urinalysis, Routine w reflex microscopic   Collection Time: 12/25/16  4:12 PM  Result Value Ref Range   Color, Urine YELLOW YELLOW   APPearance HAZY (A) CLEAR   Specific Gravity, Urine 1.005 1.005 - 1.030   pH 7.0 5.0 - 8.0   Glucose, UA NEGATIVE NEGATIVE mg/dL   Hgb urine dipstick NEGATIVE NEGATIVE   Bilirubin Urine NEGATIVE NEGATIVE   Ketones, ur 5 (A) NEGATIVE mg/dL   Protein, ur NEGATIVE NEGATIVE mg/dL   Nitrite NEGATIVE NEGATIVE   Leukocytes, UA MODERATE (A) NEGATIVE   RBC / HPF 0-5 0 - 5 RBC/hpf   WBC, UA 6-30 0 - 5 WBC/hpf   Bacteria, UA NONE SEEN NONE SEEN   Squamous Epithelial / LPF 6-30 (A) NONE SEEN  CBC with Differential/Platelet   Collection Time: 12/25/16  8:04 PM  Result Value Ref Range   WBC 7.3 4.0 - 10.5 K/uL   RBC 3.60 (L) 3.87 - 5.11 MIL/uL   Hemoglobin 10.8 (L)  12.0 - 15.0 g/dL   HCT 11.9 (L) 14.7 - 82.9 %   MCV 84.2 78.0 - 100.0 fL   MCH 30.0 26.0 - 34.0 pg   MCHC 35.6 30.0 - 36.0 g/dL   RDW 56.2 13.0 - 86.5 %   Platelets 198 150 - 400 K/uL   Other PENDING %   Neutrophils Relative % 62 %   Neutro Abs 4.5 1.7 - 7.7 K/uL   Lymphocytes Relative 32 %   Lymphs Abs 2.3 0.7 - 4.0 K/uL   Monocytes Relative 6 %   Monocytes Absolute 0.4 0.1 - 1.0 K/uL   Eosinophils Relative 0 %   Eosinophils Absolute 0.0 0.0 - 0.7 K/uL   Basophils Relative 0 %   Basophils Absolute 0.0 0.0 - 0.1 K/uL    Imaging Studies: Dg Chest 2 View  Result Date: 12/25/2016 CLINICAL DATA:  Cough and wheezing for 1 week. EXAM: CHEST  2 VIEW COMPARISON:  Chest CT 12/12/2014 FINDINGS: Normal cardiac and mediastinal contours. No consolidative pulmonary opacities. No pleural effusion pneumothorax. Thoracic spine degenerative changes. IMPRESSION: No active cardiopulmonary disease. Electronically Signed   By: Annia Belt M.D.   On: 12/25/2016 14:32     Assessment and Plan: Patient Active Problem List   Diagnosis Date Noted  . Hyperemesis 12/25/2016  . Hyperemesis affecting pregnancy, antepartum 12/25/2016  . Acute upper respiratory infection 12/25/2016  . Hypokalemia 12/25/2016   -Admit to Antenatal -Dop tones Q shift -Start azithromycin, continue ceftriaxone -Zofran or phenergan for N/V -IV fluid resuscitation -replete K with IV or PO if able to tolerate  Gwenevere Abbot, MD Family Medicine Resident, PGY-2 Faculty Practice, [facility]

## 2016-12-25 NOTE — ED Triage Notes (Signed)
Pt c/o morning sickness for 1 week. Pt reports unable to tolerate PO food or fluids.

## 2016-12-25 NOTE — ED Provider Notes (Addendum)
[facility]-EMERGENCY DEPT Provider Note   CSN: [id number] Arrival date & time: 12/25/16  1241   By signing my name below, I, Clarisse Gouge, attest that this documentation has been prepared under the direction and in the presence of Shaune Pollack, MD. Electronically signed, Clarisse Gouge, ED Scribe. 12/25/16. 1:42 PM.  History   Chief Complaint Chief Complaint  Patient presents with  . Morning Sickness   The history is provided by the patient and medical records. No language interpreter was used.    HPI Comments: Natalie Riggs is a 26 y.o. female who presents to the Emergency Department complaining of worsening vomiting x > 1 week. She notes associated nausea, productive cough with brown sputum, congestion that is subsiding, weakness, subjective fever, chills, diaphoresis disturbing sleep, sore throat, weight loss, ear ache exacerbated by cold air, headache, increased thirst and diarrhea. Pt reportedly unable to tolerate fluids or solids x 1 week. She states she is [redacted] weeks pregnant without known complications. Pt denies vaginal bleeding, dysuria, hematuria and tobacco use. OB/GYN Brennan Bailey, MD. No sick contacts noted. G4/P2/A1. Her throat pain is an aching, throbbing, significant pain.  Past Medical History:  Diagnosis Date  . Miscarriage     There are no active problems to display for this patient.   Past Surgical History:  Procedure Laterality Date  . DILATATION & CURETTAGE/HYSTEROSCOPY WITH MYOSURE      OB History    Gravida Para Term Preterm AB Living   1             SAB TAB Ectopic Multiple Live Births                   Home Medications    Prior to Admission medications   Medication Sig Start Date End Date Taking? Authorizing Provider  acetaminophen (TYLENOL) 500 MG tablet Take 2 tablets (1,000 mg total) by mouth every 6 (six) hours as needed for mild pain. Patient not taking: Reported on 11/27/2016 08/18/16   Lyndal Pulley, MD  ibuprofen (ADVIL,MOTRIN) 800 MG  tablet Take 1 tablet (800 mg total) by mouth every 6 (six) hours as needed for moderate pain. Patient not taking: Reported on 11/27/2016 08/18/16   Lyndal Pulley, MD  ondansetron (ZOFRAN ODT) 8 MG disintegrating tablet Take 1 tablet (8 mg total) by mouth every 8 (eight) hours as needed for nausea or vomiting. 11/27/16   Laurence Spates, MD  polyethylene glycol powder (MIRALAX) powder TAKE 6 CAPFULS OF MIRALAX IN A 32 OUNCE GATORADE AND DRINK THE WHOLE BEVERAGE FOLLOWED BY 3 CAPFULS TWICE A DAY FOR THE NEXT WEEK AND FOLLOW UP WITH YOUR PRIMARY CARE PHYSICIAN. Patient not taking: Reported on 11/27/2016 08/18/16   Lyndal Pulley, MD  promethazine (PHENERGAN) 25 MG tablet Take 1 tablet (25 mg total) by mouth every 6 (six) hours as needed for nausea or vomiting. Patient not taking: Reported on 11/27/2016 06/25/15   Minna Antis, MD    Family History No family history on file.  Social History Social History  Substance Use Topics  . Smoking status: Current Every Day Smoker    Packs/day: 0.50    Types: Cigarettes  . Smokeless tobacco: Never Used  . Alcohol use No     Allergies   Patient has no known allergies.   Review of Systems Review of Systems  Constitutional: Positive for activity change, appetite change, chills, diaphoresis, fatigue and fever.  HENT: Positive for congestion, ear pain and sore throat.   Gastrointestinal: Positive for diarrhea, nausea  and vomiting.  Genitourinary: Negative for dysuria, hematuria, vaginal bleeding and vaginal discharge.  Allergic/Immunologic: Positive for environmental allergies and immunocompromised state.  Neurological: Positive for weakness and headaches.  All other systems reviewed and are negative.    Physical Exam Updated Vital Signs BP 121/64   Pulse 88   Temp 97.7 F (36.5 C) (Oral)   Resp 18   Ht 5\' 8"  (1.727 m)   Wt 160 lb (72.6 kg)   LMP 07/30/2016   SpO2 98%   BMI 24.33 kg/m   Physical Exam  Constitutional: She is  oriented to person, place, and time. She appears well-developed and well-nourished. No distress.  HENT:  Head: Normocephalic and atraumatic.  Mouth/Throat: No oropharyngeal exudate.  Mildly dry MM with posterior pharyngeal erythema. No tonsillar exudates.  Eyes: Conjunctivae are normal.  Neck: Neck supple.  Cardiovascular: Normal rate, regular rhythm and normal heart sounds.  Exam reveals no friction rub.   No murmur heard. Pulmonary/Chest: Effort normal. No respiratory distress. She has no wheezes. She has rhonchi in the left upper field, the left middle field and the left lower field. She has no rales.  bibasilar rhonchi in the lower lobes  Abdominal: Soft. Bowel sounds are normal. She exhibits no distension. There is no tenderness.  No RUQ TTP. Neg Murphy's.  Musculoskeletal: She exhibits no edema.  Neurological: She is alert and oriented to person, place, and time. She exhibits normal muscle tone.  Skin: Skin is warm. Capillary refill takes 2 to 3 seconds.  Psychiatric: She has a normal mood and affect.  Nursing note and vitals reviewed.    ED Treatments / Results  DIAGNOSTIC STUDIES: Oxygen Saturation is 98% on RA, normal by my interpretation.    COORDINATION OF CARE: 1:42 PM Discussed treatment plan with pt at bedside and pt agreed to plan. Will order fluids, imaging and medications.  Labs (all labs ordered are listed, but only abnormal results are displayed) Labs Reviewed  COMPREHENSIVE METABOLIC PANEL - Abnormal; Notable for the following:       Result Value   Potassium 2.9 (*)    Chloride 99 (*)    Total Protein 8.3 (*)    AST 50 (*)    ALT 79 (*)    Total Bilirubin 1.3 (*)    All other components within normal limits  LIPASE, BLOOD  CBC  URINALYSIS, ROUTINE W REFLEX MICROSCOPIC  INFLUENZA PANEL BY PCR (TYPE A & B)  I-STAT BETA HCG BLOOD, ED ([facility], [facility], [facility] ONLY)  I-STAT CG4 LACTIC ACID, ED    EKG  EKG Interpretation None       Radiology No results  found.  Procedures Procedures (including critical care time)  Medications Ordered in ED Medications  sodium chloride 0.9 % bolus 1,000 mL (not administered)  metoCLOPramide (REGLAN) injection 10 mg (not administered)  diphenhydrAMINE (BENADRYL) injection 25 mg (not administered)  potassium chloride SA (K-DUR,KLOR-CON) CR tablet 40 mEq (not administered)     Initial Impression / Assessment and Plan / ED Course  I have reviewed the triage vital signs and the nursing notes.  Pertinent labs & imaging results that were available during my care of the patient were reviewed by me and considered in my medical decision making (see chart for details).    26 yo G3P2 at estimated [redacted] wk GA here with fever, chills, cough, and vomiting. On arrival, pt markedly dehydrated clinically but o/w afebrile and HDS. Exam as above. Suspect viral URI, influenza, possibly early CAP complicated by  worsening hyperemesis gravidarum. No abdominal pain, VB, vaginal discharge, or sx to suggest pregnancy complication. I am concerned about her inability to tolerate PO, however, and she has lost 1 lb over past week. Will start fluids, give sx control, and re-assess.  Labs as above. Pt hypokalemic, elevated bilirubin, and transaminitis. I suspect this is 2/2 significant dehydration and likely viral process. Flu pending. CXR clear but c/f clinical PNA. Will continue fluids, re-assess. LA pending. UA pending.  Influenza negative. Pt continues to have vomiting, general malaise. Discuss[facility] with [facility] Penne [facility]ash at [facility]. Will replete K, and [facility], refractory vomiting in setting of URI. FHR 130s. Given absence of current fever, myalgias, do not feel empiric tamiflu indicated at this time and feel risks/side effects would outweigh benefits.  ADDENDUM: UA with 6-30 WBCs, ketones. Unclear if this is 2/2 contamination, dehydration, or UTI. Will give dose of Rocephin here, add on cx.  Final Clinical Impressions(s) / ED  Diagnoses   Final diagnoses:  Hyperemesis affecting pregnancy, antepartum  Hypokalemia  Transaminitis    I personally performed the services described in this documentation, which was scribed in my presence. The recorded information has been reviewed and is accurate.    Shaune Pollack, MD 12/25/16 1610    Shaune Pollack, MD 12/25/16 [phone]

## 2016-12-26 LAB — COMPREHENSIVE METABOLIC PANEL
ALBUMIN: 2.6 g/dL — AB (ref 3.5–5.0)
ALT: 59 U/L — ABNORMAL HIGH (ref 14–54)
ANION GAP: 3 — AB (ref 5–15)
AST: 38 U/L (ref 15–41)
Alkaline Phosphatase: 45 U/L (ref 38–126)
BILIRUBIN TOTAL: 0.8 mg/dL (ref 0.3–1.2)
BUN: 5 mg/dL — ABNORMAL LOW (ref 6–20)
CO2: 22 mmol/L (ref 22–32)
Calcium: 7.7 mg/dL — ABNORMAL LOW (ref 8.9–10.3)
Chloride: 109 mmol/L (ref 101–111)
Creatinine, Ser: 0.5 mg/dL (ref 0.44–1.00)
GFR calc Af Amer: >60 mL/min (ref >60)
GFR calc non Af Amer: >60 mL/min (ref >60)
GLUCOSE: 99 mg/dL (ref 65–99)
POTASSIUM: 3.5 mmol/L (ref 3.5–5.1)
Sodium: 134 mmol/L — ABNORMAL LOW (ref 135–145)
TOTAL PROTEIN: 5.7 g/dL — AB (ref 6.5–8.1)

## 2016-12-26 LAB — CBC WITH DIFFERENTIAL/PLATELET
BASOS PCT: 0 %
Basophils Absolute: 0 10*3/uL (ref 0.0–0.1)
Eosinophils Absolute: 0 10*3/uL (ref 0.0–0.7)
Eosinophils Relative: 0 %
HCT: 30.3 % — ABNORMAL LOW (ref 36.0–46.0)
HEMOGLOBIN: 10.8 g/dL — AB (ref 12.0–15.0)
LYMPHS ABS: 2.3 10*3/uL (ref 0.7–4.0)
LYMPHS PCT: 32 %
MCH: 30 pg (ref 26.0–34.0)
MCHC: 35.6 g/dL (ref 30.0–36.0)
MCV: 84.2 fL (ref 78.0–100.0)
MONOS PCT: 6 %
Monocytes Absolute: 0.4 10*3/uL (ref 0.1–1.0)
NEUTROS ABS: 4.5 10*3/uL (ref 1.7–7.7)
NEUTROS PCT: 62 %
Platelets: 198 10*3/uL (ref 150–400)
RBC: 3.6 MIL/uL — ABNORMAL LOW (ref 3.87–5.11)
RDW: 12.3 % (ref 11.5–15.5)
WBC: 7.3 10*3/uL (ref 4.0–10.5)

## 2016-12-26 LAB — ABO/RH: ABO/RH(D): B POS

## 2016-12-26 LAB — URINE CULTURE: Special Requests: NORMAL

## 2016-12-26 LAB — CBC
HEMATOCRIT: 25.8 % — AB (ref 36.0–46.0)
HEMOGLOBIN: 9.1 g/dL — AB (ref 12.0–15.0)
MCH: 29.7 pg (ref 26.0–34.0)
MCHC: 35.3 g/dL (ref 30.0–36.0)
MCV: 84.3 fL (ref 78.0–100.0)
Platelets: 174 10*3/uL (ref 150–400)
RBC: 3.06 MIL/uL — ABNORMAL LOW (ref 3.87–5.11)
RDW: 12.4 % (ref 11.5–15.5)
WBC: 6.1 10*3/uL (ref 4.0–10.5)

## 2016-12-26 MED ORDER — BENZONATATE 100 MG PO CAPS
100.0000 mg | ORAL_CAPSULE | Freq: Three times a day (TID) | ORAL | Status: DC | PRN
  Filled 2016-12-26: qty 1

## 2016-12-26 MED ORDER — GI COCKTAIL ~~LOC~~
30.0000 mL | Freq: Once | ORAL | Status: AC
  Administered 2016-12-26: 30 mL via ORAL
  Filled 2016-12-26: qty 30

## 2016-12-26 MED ORDER — AZITHROMYCIN 250 MG PO TABS
250.0000 mg | ORAL_TABLET | Freq: Every day | ORAL | Status: AC
  Administered 2016-12-26 – 2016-12-29 (×4): 250 mg via ORAL
  Filled 2016-12-26 (×4): qty 1

## 2016-12-26 MED ORDER — PROMETHAZINE HCL 25 MG PO TABS
25.0000 mg | ORAL_TABLET | Freq: Four times a day (QID) | ORAL | Status: DC | PRN
  Administered 2016-12-26: 25 mg via ORAL
  Filled 2016-12-26: qty 1

## 2016-12-26 MED ORDER — PROMETHAZINE HCL 25 MG PO TABS
25.0000 mg | ORAL_TABLET | Freq: Four times a day (QID) | ORAL | Status: DC
  Administered 2016-12-26 – 2016-12-30 (×12): 25 mg via ORAL
  Filled 2016-12-26 (×13): qty 1

## 2016-12-26 MED ORDER — IPRATROPIUM-ALBUTEROL 0.5-2.5 (3) MG/3ML IN SOLN
3.0000 mL | Freq: Four times a day (QID) | RESPIRATORY_TRACT | Status: DC
  Filled 2016-12-26 (×5): qty 3

## 2016-12-26 MED ORDER — FAMOTIDINE 20 MG PO TABS
20.0000 mg | ORAL_TABLET | Freq: Two times a day (BID) | ORAL | Status: DC
  Administered 2016-12-26 (×2): 20 mg via ORAL
  Filled 2016-12-26 (×2): qty 1

## 2016-12-26 MED ORDER — DOCUSATE SODIUM 100 MG PO CAPS
100.0000 mg | ORAL_CAPSULE | Freq: Two times a day (BID) | ORAL | Status: DC | PRN
  Administered 2016-12-30: 100 mg via ORAL
  Filled 2016-12-26: qty 1

## 2016-12-26 MED ORDER — BENZONATATE 100 MG PO CAPS
200.0000 mg | ORAL_CAPSULE | Freq: Three times a day (TID) | ORAL | Status: AC
  Administered 2016-12-26 – 2016-12-29 (×9): 200 mg via ORAL
  Filled 2016-12-26 (×9): qty 2

## 2016-12-26 MED ORDER — ALBUTEROL SULFATE (2.5 MG/3ML) 0.083% IN NEBU: 3.0000 mL | INHALATION_SOLUTION | RESPIRATORY_TRACT | Status: DC | PRN

## 2016-12-26 MED ORDER — IPRATROPIUM-ALBUTEROL 20-100 MCG/ACT IN AERS: 1.0000 | INHALATION_SPRAY | RESPIRATORY_TRACT | Status: DC

## 2016-12-26 MED ORDER — IPRATROPIUM-ALBUTEROL 0.5-2.5 (3) MG/3ML IN SOLN
3.0000 mL | RESPIRATORY_TRACT | Status: DC
  Filled 2016-12-26 (×2): qty 3

## 2016-12-26 NOTE — Progress Notes (Signed)
[facility] Note Patient sleeping/resting.  No SOB, fevers, chills. She does have a cough (improved) and only rarely productive of green-yellowish phlegm. She does have nausea that seems better with the medications that she's on. She has substernal/chest discomfort that she states it's been going on for the past two weeks that she thought was breast tenderness and worse with cough.   Current Vital Signs 24h Vital Sign Ranges  T 98.5 F (36.9 C) Temp  Avg: 98.4 F (36.9 C)  Min: 98 F (36.7 C)  Max: 98.9 F (37.2 C)  BP (!) 86/67 BP  Min: 86/67  Max: 113/65  HR 80 Pulse  Avg: 78.3  Min: 68  Max: 91  RR 18 Resp  Avg: 17.6  Min: 16  Max: 18  SaO2 100 % Not Delivered SpO2  Avg: 100 %  Min: 100 %  Max: 100 %       24 Hour I/O Current Shift I/O  Time Ins Outs 03/25 0701 - 03/26 0700 In: 2050  Out: -  No intake/output data recorded.   Patient Vitals for the past 24 hrs:  BP Temp Temp src Pulse Resp SpO2  12/26/16 1200 (!) 86/67 98.5 F (36.9 C) Oral 80 18 100 %  12/26/16 0800 (!) 100/52 98.9 F (37.2 C) Oral 72 18 100 %  12/26/16 0532 106/66 98.6 F (37 C) Oral 91 18 -  12/25/16 2345 113/65 98 F (36.7 C) Oral 79 18 -  12/25/16 1854 111/82 98.5 F (36.9 C) Oral 74 17 100 %  12/25/16 1822 112/60 98.3 F (36.8 C) Oral 68 18 100 %  12/25/16 1516 (!) 106/55 98 F (36.7 C) Oral 84 16 100 %    Physical exam: General: Well nourished, well developed female in no acute distress. Cardiovascular: S1, S2 normal, no murmur, rub or gallop, regular rate and rhythm Respiratory: CTAB, no resp distress Skin: Warm and dry.   Medications: Current Facility-Administered Medications  Medication Dose Route Frequency Provider Last Rate Last Dose  . acetaminophen (TYLENOL) tablet 650 mg  650 mg Oral Q4H PRN Gwenevere AbbotNimeka Phillip, MD      . azithromycin (ZITHROMAX) tablet 250 mg  250 mg Oral Daily Rodey Bingharlie Sidda Humm, MD   250 mg at 12/26/16 1130  . benzonatate (TESSALON) capsule 200 mg  200 mg Oral TID PRN Gwenevere AbbotNimeka  Phillip, MD   200 mg at 12/26/16 [id number]  . docusate sodium (COLACE) capsule 100 mg  100 mg Oral BID PRN Aragon Bingharlie Cooper Stamp, MD      . famotidine (PEPCID) tablet 20 mg  20 mg Oral BID Trommald Bingharlie Chynah Orihuela, MD   20 mg at 12/26/16 0948  . ipratropium-albuterol (DUONEB) 0.5-2.5 (3) MG/3ML nebulizer solution 3 mL  3 mL Nebulization Q6H [place]ippun Bingharlie Cielo Arias, MD      . ondansetron (ZOFRAN) injection 4 mg  4 mg Intravenous Q6H PRN Gwenevere AbbotNimeka Phillip, MD   4 mg at 12/25/16 2341   Or  . ondansetron (ZOFRAN-ODT) disintegrating tablet 4 mg  4 mg Oral Q6H PRN Gwenevere AbbotNimeka Phillip, MD      . prenatal multivitamin tablet 1 tablet  1 tablet Oral Q1200 Gwenevere AbbotNimeka Phillip, MD      . promethazine (PHENERGAN) injection 12.5 mg  12.5 mg Intravenous Q6H PRN Gwenevere AbbotNimeka Phillip, MD   12.5 mg at 12/25/16 2152  . promethazine (PHENERGAN) tablet 25 mg  25 mg Oral Q6H PRN Premont Bingharlie Bo Rogue, MD   25 mg at 12/26/16 1454    Labs:   Recent Labs Lab 12/25/16 1300 12/25/16  2004 12/26/16 0519  WBC 7.8 7.3 6.1  HGB 13.2 10.8* 9.1*  HCT 36.8 30.3* 25.8*  PLT 234 198 174     Recent Labs Lab 12/25/16 1300 12/26/16 0519  NA 135 134*  K 2.9* 3.5  CL 99* 109  CO2 24 22  BUN 9 5*  CREATININE 0.62 0.50  CALCIUM 9.3 7.7*  PROT 8.3* 5.7*  BILITOT 1.3* 0.8  ALKPHOS 70 45  ALT 79* 59*  AST 50* 38  GLUCOSE 96 99   Pending: UCx  Radiology: no new imaging.   Assessment & Plan:  Pt doing well Seems to be getting better with most likely viral URI/bronchitis so will change medications and hospital issue, at this point, is nausea and vomiting of pregnancy.  Will d/c rocephin but continue azithro as PO Z-pack Will change scheduled duonebs to PRN albuterol GI cocktail and will schedule PO phenergan and tessalon perles Saline lock IV and continue regular diet; pt told to pick and choose what she'd like   Cornelia Copa MD Attending Center for [facility])

## 2016-12-26 NOTE — Progress Notes (Signed)
FACULTY PRACTICE ANTEPARTUM(COMPREHENSIVE) NOTE  Natalie Riggs is a 26 y.o. [phone] at 6175w6d  who is admitted for bronchitis/ bronchospasm, with nausea and vomiting of early pregnancy.   Fetal presentation is n/a. Length of Stay:  1  Days  Subjective: Pt complains of both persistent cough, and persistent nausea and vomiting. Denies fever, chills Having some loose stool  Vitals:  Blood pressure 106/66, pulse 91, temperature 98.6 F (37 C), temperature source Oral, resp. rate 18, height 5\' 8"  (1.727 m), weight 72.6 kg (160 lb), last menstrual period 07/30/2016, SpO2 100 %.  Temp:  [97.7 F (36.5 C)-98.6 F (37 C)] 98.6 F (37 C) (03/26 0532) Pulse Rate:  [68-91] 91 (03/26 0532) Resp:  [16-18] 18 (03/26 0532) BP: (106-121)/(55-82) 106/66 (03/26 0532) SpO2:  [98 %-100 %] 100 % (03/25 1854) Weight:  [72.6 kg (160 lb)] 72.6 kg (160 lb) (03/25 1255)  Physical Examination:  General appearance - oriented to person, place, and time, normal appearing weight, well hydrated and ill-appearing Heart - normal rate and regular rhythm Abdomen - soft, nontender, nondistended Fundal Height:   Cervical Exam: Not evaluated. Extremities: extremities normal, atraumatic, no cyanosis or edema and Homans sign is negative, no sign of DVT with DTRs 2+ bilaterally Membranes:intact  Fetal Monitoring:    Labs:  Results for orders placed or performed during the hospital encounter of 12/25/16 (from the past 24 hour(s))  Lipase, blood   Collection Time: 12/25/16  1:00 PM  Result Value Ref Range   Lipase 27 11 - 51 U/L  Comprehensive metabolic panel   Collection Time: 12/25/16  1:00 PM  Result Value Ref Range   Sodium 135 135 - 145 mmol/L   Potassium 2.9 (L) 3.5 - 5.1 mmol/L   Chloride 99 (L) 101 - 111 mmol/L   CO2 24 22 - 32 mmol/L   Glucose, Bld 96 65 - 99 mg/dL   BUN 9 6 - 20 mg/dL   Creatinine, Ser 4.540.62 0.44 - 1.00 mg/dL   Calcium 9.3 8.9 - 09.810.3 mg/dL   Total Protein 8.3 (H) 6.5 - 8.1 g/dL    Albumin 3.5 3.5 - 5.0 g/dL   AST 50 (H) 15 - 41 U/L   ALT 79 (H) 14 - 54 U/L   Alkaline Phosphatase 70 38 - 126 U/L   Total Bilirubin 1.3 (H) 0.3 - 1.2 mg/dL   GFR calc non Af Amer >60 >60 mL/min   GFR calc Af Amer >60 >60 mL/min   Anion gap 12 5 - 15  CBC   Collection Time: 12/25/16  1:00 PM  Result Value Ref Range   WBC 7.8 4.0 - 10.5 K/uL   RBC 4.39 3.87 - 5.11 MIL/uL   Hemoglobin 13.2 12.0 - 15.0 g/dL   HCT 11.936.8 14.736.0 - 82.946.0 %   MCV 83.8 78.0 - 100.0 fL   MCH 30.1 26.0 - 34.0 pg   MCHC 35.9 30.0 - 36.0 g/dL   RDW 56.211.8 13.011.5 - 86.515.5 %   Platelets 234 150 - 400 K/uL  Influenza panel by PCR (type A & B)   Collection Time: 12/25/16  2:19 PM  Result Value Ref Range   Influenza A By PCR NEGATIVE NEGATIVE   Influenza B By PCR NEGATIVE NEGATIVE  I-Stat Beta hCG blood, ED ([facility], [facility], [facility] only)   Collection Time: 12/25/16  2:37 PM  Result Value Ref Range   I-stat hCG, quantitative >2,000.0 (H) <5 mIU/mL   Comment 3  I-Stat CG4 Lactic Acid, ED   Collection Time: 12/25/16  3:22 PM  Result Value Ref Range   Lactic Acid, Venous 1.81 0.5 - 1.9 mmol/L  Urinalysis, Routine w reflex microscopic   Collection Time: 12/25/16  4:12 PM  Result Value Ref Range   Color, Urine YELLOW YELLOW   APPearance HAZY (A) CLEAR   Specific Gravity, Urine 1.005 1.005 - 1.030   pH 7.0 5.0 - 8.0   Glucose, UA NEGATIVE NEGATIVE mg/dL   Hgb urine dipstick NEGATIVE NEGATIVE   Bilirubin Urine NEGATIVE NEGATIVE   Ketones, ur 5 (A) NEGATIVE mg/dL   Protein, ur NEGATIVE NEGATIVE mg/dL   Nitrite NEGATIVE NEGATIVE   Leukocytes, UA MODERATE (A) NEGATIVE   RBC / HPF 0-5 0 - 5 RBC/hpf   WBC, UA 6-30 0 - 5 WBC/hpf   Bacteria, UA NONE SEEN NONE SEEN   Squamous Epithelial / LPF 6-30 (A) NONE SEEN  CBC with Differential/Platelet   Collection Time: 12/25/16  8:04 PM  Result Value Ref Range   WBC 7.3 4.0 - 10.5 K/uL   RBC 3.60 (L) 3.87 - 5.11 MIL/uL   Hemoglobin 10.8 (L) 12.0 - 15.0 g/dL   HCT 40.9 (L) 81.1 -  46.0 %   MCV 84.2 78.0 - 100.0 fL   MCH 30.0 26.0 - 34.0 pg   MCHC 35.6 30.0 - 36.0 g/dL   RDW 91.4 78.2 - 95.6 %   Platelets 198 150 - 400 K/uL   Neutrophils Relative % 62 %   Neutro Abs 4.5 1.7 - 7.7 K/uL   Lymphocytes Relative 32 %   Lymphs Abs 2.3 0.7 - 4.0 K/uL   Monocytes Relative 6 %   Monocytes Absolute 0.4 0.1 - 1.0 K/uL   Eosinophils Relative 0 %   Eosinophils Absolute 0.0 0.0 - 0.7 K/uL   Basophils Relative 0 %   Basophils Absolute 0.0 0.0 - 0.1 K/uL  Type and screen [facility]   Collection Time: 12/25/16  8:04 PM  Result Value Ref Range   ABO/RH(D) B POS    Antibody Screen NEG    Sample Expiration 12/28/2016   ABO/Rh   Collection Time: 12/25/16  8:04 PM  Result Value Ref Range   ABO/RH(D) B POS   Comprehensive metabolic panel   Collection Time: 12/26/16  5:19 AM  Result Value Ref Range   Sodium 134 (L) 135 - 145 mmol/L   Potassium 3.5 3.5 - 5.1 mmol/L   Chloride 109 101 - 111 mmol/L   CO2 22 22 - 32 mmol/L   Glucose, Bld 99 65 - 99 mg/dL   BUN 5 (L) 6 - 20 mg/dL   Creatinine, Ser 2.13 0.44 - 1.00 mg/dL   Calcium 7.7 (L) 8.9 - 10.3 mg/dL   Total Protein 5.7 (L) 6.5 - 8.1 g/dL   Albumin 2.6 (L) 3.5 - 5.0 g/dL   AST 38 15 - 41 U/L   ALT 59 (H) 14 - 54 U/L   Alkaline Phosphatase 45 38 - 126 U/L   Total Bilirubin 0.8 0.3 - 1.2 mg/dL   GFR calc non Af Amer >60 >60 mL/min   GFR calc Af Amer >60 >60 mL/min   Anion gap 3 (L) 5 - 15  CBC   Collection Time: 12/26/16  5:19 AM  Result Value Ref Range   WBC 6.1 4.0 - 10.5 K/uL   RBC 3.06 (L) 3.87 - 5.11 MIL/uL   Hemoglobin 9.1 (L) 12.0 - 15.0 g/dL   HCT  25.8 (L) 36.0 - 46.0 %   MCV 84.3 78.0 - 100.0 fL   MCH 29.7 26.0 - 34.0 pg   MCHC 35.3 30.0 - 36.0 g/dL   RDW 25.9 56.3 - 87.5 %   Platelets 174 150 - 400 K/uL    Imaging Studies:     Currently EPIC will not allow sonographic studies to automatically populate into notes.  In the meantime, copy and paste results into note or free  text.  Medications:  Scheduled . azithromycin  500 mg Intravenous Q24H  . cefTRIAXone (ROCEPHIN)  IV  2 g Intravenous Q24H  . docusate sodium  100 mg Oral Daily  . lactated ringers   Intravenous STAT  . prenatal multivitamin  1 tablet Oral Q1200   I have reviewed the patient's current medications.  ASSESSMENT: Patient Active Problem List   Diagnosis Date Noted  . Hyperemesis 12/25/2016  . Hyperemesis affecting pregnancy, antepartum 12/25/2016  . Acute upper respiratory infection 12/25/2016  . Hypokalemia 12/25/2016  Hypokalemia , corrected URI /bronchitis with bronchospasm Nausea and vomiting of pregnancy worsened by URI  PLAN: Increase albuterol/ipatroprium to q 6 h Zofran/ IV phenergan Continue Rocephin/Azithromycin for now. Droplet precautions.  Tilda Burrow 12/26/2016,7:28 AM    Patient ID: Natalie Riggs, female   DOB: Mar 08, 1991, 26 y.o.   MRN: [id number]

## 2016-12-27 DIAGNOSIS — O210 Mild hyperemesis gravidarum: Secondary | ICD-10-CM

## 2016-12-27 LAB — CBC
HEMATOCRIT: 28.2 % — AB (ref 36.0–46.0)
Hemoglobin: 10.2 g/dL — ABNORMAL LOW (ref 12.0–15.0)
MCH: 30.8 pg (ref 26.0–34.0)
MCHC: 36.2 g/dL — AB (ref 30.0–36.0)
MCV: 85.2 fL (ref 78.0–100.0)
Platelets: 213 10*3/uL (ref 150–400)
RBC: 3.31 MIL/uL — ABNORMAL LOW (ref 3.87–5.11)
RDW: 12.3 % (ref 11.5–15.5)
WBC: 5.9 10*3/uL (ref 4.0–10.5)

## 2016-12-27 LAB — COMPREHENSIVE METABOLIC PANEL
ALK PHOS: 47 U/L (ref 38–126)
ALT: 58 U/L — ABNORMAL HIGH (ref 14–54)
AST: 30 U/L (ref 15–41)
Albumin: 3 g/dL — ABNORMAL LOW (ref 3.5–5.0)
Anion gap: 6 (ref 5–15)
CALCIUM: 8.4 mg/dL — AB (ref 8.9–10.3)
CO2: 20 mmol/L — ABNORMAL LOW (ref 22–32)
Chloride: 109 mmol/L (ref 101–111)
Creatinine, Ser: 0.56 mg/dL (ref 0.44–1.00)
GFR calc Af Amer: >60 mL/min (ref >60)
Glucose, Bld: 77 mg/dL (ref 65–99)
POTASSIUM: 3.3 mmol/L — AB (ref 3.5–5.1)
Sodium: 135 mmol/L (ref 135–145)
TOTAL PROTEIN: 6.7 g/dL (ref 6.5–8.1)
Total Bilirubin: 0.7 mg/dL (ref 0.3–1.2)

## 2016-12-27 MED ORDER — LACTATED RINGERS IV SOLN
INTRAVENOUS | Status: DC
  Administered 2016-12-28: 75 mL/h via INTRAVENOUS
  Administered 2016-12-28 – 2016-12-29 (×3): via INTRAVENOUS

## 2016-12-27 MED ORDER — SCOPOLAMINE 1 MG/3DAYS TD PT72
1.0000 | MEDICATED_PATCH | TRANSDERMAL | Status: DC
  Administered 2016-12-27 – 2016-12-30 (×2): 1.5 mg via TRANSDERMAL
  Filled 2016-12-27 (×3): qty 1

## 2016-12-27 MED ORDER — BOOST / RESOURCE BREEZE PO LIQD
1.0000 | Freq: Three times a day (TID) | ORAL | Status: DC
  Administered 2016-12-27 – 2016-12-30 (×9): 1 via ORAL
  Filled 2016-12-27 (×12): qty 1

## 2016-12-27 MED ORDER — PANTOPRAZOLE SODIUM 40 MG PO TBEC
40.0000 mg | DELAYED_RELEASE_TABLET | Freq: Every day | ORAL | Status: DC
  Administered 2016-12-27 – 2016-12-30 (×3): 40 mg via ORAL
  Filled 2016-12-27 (×4): qty 1

## 2016-12-27 NOTE — Progress Notes (Signed)
Daily Antepartum Note  Admission Date: 12/25/2016 Current Date: 12/27/2016 6:35 AM  Natalie Riggs is a 26 y.o. [id number] @ 3289w0d, HD#3, admitted for possible CAP/URI and n/v of pregnancy.  Pregnancy complicated by: nothing  Overnight/24hr events:  Cough improving and n/v of pregnancy slightly improving  Subjective:  See above. Patient able to take PO liquids and some foods. No constitutional s/s. Vomiting sounds like it's stomach acid/mucus. Still cough but mostly dry.   Objective:   Patient Vitals for the past 24 hrs:  BP Temp Temp src Pulse Resp SpO2  12/26/16 2003 116/67 98.3 F (36.8 C) Oral 73 18 -  12/26/16 1623 121/65 97.4 F (36.3 C) Oral 71 17 100 %  12/26/16 1200 (!) 86/67 98.5 F (36.9 C) Oral 80 18 100 %  12/26/16 0800 (!) 100/52 98.9 F (37.2 C) Oral 72 18 100 %    Physical exam: General: Well nourished, well developed female in no acute distress. Abdomen: NTTP Cardiovascular: S1, S2 normal, no murmur, rub or gallop, regular rate and rhythm Respiratory: CTAB, no resp distress Extremities: no clubbing, cyanosis or edema Skin: Warm and dry.   Medications: Current Facility-Administered Medications  Medication Dose Route Frequency Provider Last Rate Last Dose  . acetaminophen (TYLENOL) tablet 650 mg  650 mg Oral Q4H PRN Gwenevere AbbotNimeka Phillip, MD      . albuterol (PROVENTIL) (2.5 MG/3ML) 0.083% nebulizer solution 3 mL  3 mL Inhalation Q4H PRN [place]wood Bingharlie Cleota Pellerito, MD      . azithromycin (ZITHROMAX) tablet 250 mg  250 mg Oral Daily Kiester Bingharlie Ellyn Rubiano, MD   250 mg at 12/26/16 1130  . benzonatate (TESSALON) capsule 200 mg  200 mg Oral TID Alpha Bingharlie Jozalynn Noyce, MD   200 mg at 12/26/16 2215  . docusate sodium (COLACE) capsule 100 mg  100 mg Oral BID PRN McCamey Bingharlie Jerrye Seebeck, MD      . famotidine (PEPCID) tablet 20 mg  20 mg Oral BID Hyndman Bingharlie Mynor Witkop, MD   20 mg at 12/26/16 2216  . ondansetron (ZOFRAN) injection 4 mg  4 mg Intravenous Q6H PRN Gwenevere AbbotNimeka Phillip, MD   4 mg at 12/25/16 2341   Or  .  ondansetron (ZOFRAN-ODT) disintegrating tablet 4 mg  4 mg Oral Q6H PRN Gwenevere AbbotNimeka Phillip, MD      . prenatal multivitamin tablet 1 tablet  1 tablet Oral Q1200 Gwenevere AbbotNimeka Phillip, MD      . promethazine (PHENERGAN) injection 12.5 mg  12.5 mg Intravenous Q6H PRN Gwenevere AbbotNimeka Phillip, MD   12.5 mg at 12/25/16 2152  . promethazine (PHENERGAN) tablet 25 mg  25 mg Oral Q6H  Bingharlie Jordyn Hofacker, MD   25 mg at 12/27/16 0236    Labs:   Recent Labs Lab 12/25/16 1300 12/25/16 2004 12/26/16 0519  WBC 7.8 7.3 6.1  HGB 13.2 10.8* 9.1*  HCT 36.8 30.3* 25.8*  PLT 234 198 174     Recent Labs Lab 12/25/16 1300 12/26/16 0519  NA 135 134*  K 2.9* 3.5  CL 99* 109  CO2 24 22  BUN 9 5*  CREATININE 0.62 0.50  CALCIUM 9.3 7.7*  PROT 8.3* 5.7*  BILITOT 1.3* 0.8  ALKPHOS 70 45  ALT 79* 59*  AST 50* 38  GLUCOSE 96 99   Pending: UCx  Radiology: no new imaging.   Assessment & Plan:  Pt doing well *Pregnancy: no acute issues. FHTs qshift *N/V of pregnancy: patient ordering fatty foods. D/w her bland diet and she is amenable to trying boost drinks and pepcid changed to protonix  b/c it caused her to have an upset stomach. Also ordered boost, scope patch and nutrition consult. D/w pt expectations for discharge and more so to get her to be able to drink okay and take some PO *FEN/GI: see above. Switched to bland diet. f/u CMP and CBC; LFTs were down trending.  *Pulm: continue Zpack and PRN albuterol *PPx: SCDs, OOB ad lib *Dispo: hopefully tomorrow or late today.   Cornelia Copa MD Attending Center for [facility])

## 2016-12-27 NOTE — Progress Notes (Signed)
Nutrition  Pt in bed with breakfast tray, very little consumed secondary to reported nausea. Discussed ways to consume Boost, on ice or with addition of ginger ale  Education on diet for morning sickness/hyperemesis completed. Copy of AND Diet for Morning Sickness provided to pt, and included a list of high fat foods to avoid. Stressed consumption of  small frequent meals.  Consume clear liquids if nausea and vomiting become worse.  Pt indicated understanding.  Natalie Riggs M.Odis LusterEd. R.D. LDN Neonatal Nutrition Support Specialist/RD III Pager [phone]      Phone [phone]

## 2016-12-27 NOTE — Progress Notes (Signed)
Pt has order a meal tray and attempted several bites without difficulty. Pt states that she is extremely sleeping from medication. Carmelina DaneERRI L Jaecob Lowden, RN

## 2016-12-27 NOTE — Progress Notes (Signed)
Pt sitting in bed and vomiting into emesis bag. Emesis appears to be only liquid and yellow in color. Pt medicated with Zofran and phenergan. Will continue to monitor. Attempt made to contact Dietician in regards to nutritional consult. Unable to reach. Will continue to monitor. Carmelina DaneERRI L Goran Olden, RN

## 2016-12-28 LAB — COMPREHENSIVE METABOLIC PANEL
ALBUMIN: 2.7 g/dL — AB (ref 3.5–5.0)
ALK PHOS: 43 U/L (ref 38–126)
ALT: 56 U/L — AB (ref 14–54)
ANION GAP: 5 (ref 5–15)
AST: 32 U/L (ref 15–41)
BILIRUBIN TOTAL: 0.6 mg/dL (ref 0.3–1.2)
BUN: 6 mg/dL (ref 6–20)
CALCIUM: 8.3 mg/dL — AB (ref 8.9–10.3)
CO2: 22 mmol/L (ref 22–32)
CREATININE: 0.59 mg/dL (ref 0.44–1.00)
Chloride: 109 mmol/L (ref 101–111)
Glucose, Bld: 83 mg/dL (ref 65–99)
Potassium: 3.3 mmol/L — ABNORMAL LOW (ref 3.5–5.1)
SODIUM: 136 mmol/L (ref 135–145)
TOTAL PROTEIN: 6.1 g/dL — AB (ref 6.5–8.1)

## 2016-12-28 LAB — CBC
HCT: 26.3 % — ABNORMAL LOW (ref 36.0–46.0)
HEMOGLOBIN: 9.6 g/dL — AB (ref 12.0–15.0)
MCH: 30.9 pg (ref 26.0–34.0)
MCHC: 36.5 g/dL — ABNORMAL HIGH (ref 30.0–36.0)
MCV: 84.6 fL (ref 78.0–100.0)
PLATELETS: 207 10*3/uL (ref 150–400)
RBC: 3.11 MIL/uL — AB (ref 3.87–5.11)
RDW: 12.4 % (ref 11.5–15.5)
WBC: 6.4 10*3/uL (ref 4.0–10.5)

## 2016-12-28 MED ORDER — METOCLOPRAMIDE HCL 10 MG PO TABS: 10.0000 mg | ORAL_TABLET | Freq: Four times a day (QID) | ORAL | Status: DC | PRN

## 2016-12-28 MED ORDER — METOCLOPRAMIDE HCL 10 MG PO TABS
10.0000 mg | ORAL_TABLET | Freq: Three times a day (TID) | ORAL | Status: DC
  Administered 2016-12-28 – 2016-12-30 (×10): 10 mg via ORAL
  Filled 2016-12-28 (×10): qty 1

## 2016-12-28 NOTE — Progress Notes (Signed)
FACULTY PRACTICE ANTEPARTUM(COMPREHENSIVE) NOTE  Natalie Riggs is a 26 y.o. [phone] at [redacted]w[redacted]d  who is admitted for bronchitis/ bronchospasm, with nausea and vomiting of early pregnancy.   Fetal presentation is n/a. Length of Stay:  3  Days  Subjective: Pt complains of both persistent cough, although improving. She continues to have hyperemesis and is intolerant of both food and liquid. IV fluids restarted last night. Denies fever, chills.   Vitals:  Blood pressure (!) 106/55, pulse 69, temperature 98.3 F (36.8 C), temperature source Oral, resp. rate 16, height 5\' 8"  (1.727 m), weight 160 lb (72.6 kg), last menstrual period 07/30/2016, SpO2 100 %.  Temp:  [98.3 F (36.8 C)-98.7 F (37.1 C)] 98.3 F (36.8 C) (03/28 0430) Pulse Rate:  [68-72] 69 (03/28 0430) Resp:  [16-18] 16 (03/28 0430) BP: (100-115)/(53-66) 106/55 (03/28 0430) SpO2:  [100 %] 100 % (03/28 0430)  Physical Examination:  General appearance - oriented to person, place, and time, normal appearing weight, well hydrated and ill-appearing Heart - normal rate and regular rhythm Abdomen - soft, nontender, nondistended Fundal Height:   Cervical Exam: Not evaluated.  Extremities: extremities normal, atraumatic, no cyanosis or edema and Homans sign is negative, no sign of DVT with DTRs 2+ bilaterally Membranes:intact  Fetal Monitoring:    Labs:  Results for orders placed or performed during the hospital encounter of 12/25/16 (from the past 24 hour(s))  Comprehensive metabolic panel   Collection Time: 12/28/16  5:41 AM  Result Value Ref Range   Sodium 136 135 - 145 mmol/L   Potassium 3.3 (L) 3.5 - 5.1 mmol/L   Chloride 109 101 - 111 mmol/L   CO2 22 22 - 32 mmol/L   Glucose, Bld 83 65 - 99 mg/dL   BUN 6 6 - 20 mg/dL   Creatinine, Ser 1.47 0.44 - 1.00 mg/dL   Calcium 8.3 (L) 8.9 - 10.3 mg/dL   Total Protein 6.1 (L) 6.5 - 8.1 g/dL   Albumin 2.7 (L) 3.5 - 5.0 g/dL   AST 32 15 - 41 U/L   ALT 56 (H) 14 - 54 U/L   Alkaline Phosphatase 43 38 - 126 U/L   Total Bilirubin 0.6 0.3 - 1.2 mg/dL   GFR calc non Af Amer >60 >60 mL/min   GFR calc Af Amer >60 >60 mL/min   Anion gap 5 5 - 15  CBC   Collection Time: 12/28/16  5:41 AM  Result Value Ref Range   WBC 6.4 4.0 - 10.5 K/uL   RBC 3.11 (L) 3.87 - 5.11 MIL/uL   Hemoglobin 9.6 (L) 12.0 - 15.0 g/dL   HCT 82.9 (L) 56.2 - 13.0 %   MCV 84.6 78.0 - 100.0 fL   MCH 30.9 26.0 - 34.0 pg   MCHC 36.5 (H) 30.0 - 36.0 g/dL   RDW 86.5 78.4 - 69.6 %   Platelets 207 150 - 400 K/uL    Imaging Studies:     Currently EPIC will not allow sonographic studies to automatically populate into notes.  In the meantime, copy and paste results into note or free text.  Medications:  Scheduled . azithromycin  250 mg Oral Daily  . benzonatate  200 mg Oral TID  . feeding supplement  1 Container Oral TID BM  . pantoprazole  40 mg Oral Daily  . prenatal multivitamin  1 tablet Oral Q1200  . promethazine  25 mg Oral Q6H  . scopolamine  1 patch Transdermal Q72H   I have reviewed the  patient's current medications.  ASSESSMENT: Patient Active Problem List   Diagnosis Date Noted  . Hyperemesis affecting pregnancy, antepartum 12/25/2016  . Acute upper respiratory infection 12/25/2016  Hypokalemia , corrected URI /bronchitis with bronchospasm Nausea and vomiting of pregnancy worsened by URI  PLAN: 1. CAP  Continue Azithromycin (Day 3/5)  Droplet precaution  Continue albuterol 2. Hyperemesis  Taking PRN phenergan and zofran.  Start scheduled reglan AC and HS.  Continue scopolamine.  If this does not improve, discussed NG tube.  Rhona RaiderJacob J Stinson 12/28/2016,7:50 AM

## 2016-12-28 NOTE — Progress Notes (Addendum)
Initial Nutrition Assessment  DOCUMENTATION CODES:  Not applicable  INTERVENTION:  Currently soft diet, with Boost Breeze Consider downgrading diet to clear liquids, untill n/v improves  NUTRITION DIAGNOSIS:  Inadequate oral intake related to nausea as evidenced by per patient/family report. GOAL:  Patient will meet greater than or equal to 90% of their needs  MONITOR:  PO intake, Weight trends  REASON FOR ASSESSMENT:  Antenatal (Hyperemesis)   ASSESSMENT:  13 1/7 weeks with Hx of increased n/v that started on 3/17. Pt with minimal po intake since admission. However there has been no weight loss entire pregnancy per recorded weights in EPIC  Noted consideration for NG placement and tube feedings. NG tube will likely need to be place by Physician. If TF's desired recommend Jevity 1.2 initiated at 20 ml/hr continuous ng, with a 10 ml q 8 hours advancement to a goal of 75 ml/hr  ( 2160 kcal, 100 g protein, 1750 ml free water)  Diet Order:  DIET SOFT Room service appropriate? Yes; Fluid consistency: Thin  Skin:  Reviewed, no issues  Last BM:  3/25  Height:   Ht Readings from Last 1 Encounters:  12/25/16 5\' 8"  (1.727 m)   Weight:   Wt Readings from Last 1 Encounters:  12/25/16 160 lb (72.6 kg)   Ideal Body Weight:  63.6 kg  BMI:  Body mass index is 24.33 kg/m.  Estimated Nutritional Needs:   Kcal:  2000-2200  Protein:  90-100g  Fluid:  2.3 L  EDUCATION NEEDS:   Education needs addressed 3/27  Elisabeth CaraKatherine Chael Urenda M.Odis LusterEd. R.D. LDN Neonatal Nutrition Support Specialist/RD III Pager [phone]      Phone [phone]

## 2016-12-29 NOTE — Progress Notes (Signed)
Patient ID: Natalie BringJade M Riggs, female   DOB: [birth year], 26 y.o.   MRN: [id number]  FACULTY PRACTICE ANTEPARTUM(COMPREHENSIVE) NOTE  Natalie Riggs is a 26 y.o. [id number] at 7180w2d   who is admitted for bronchitis/ bronchospasm, with nausea and vomiting of early pregnancy.   Fetal presentation is n/a. Length of Stay:  4  Days  Subjective: Pt complains of both persistent cough, although improving. She continues to have hyperemesis and is intolerant of  food . IV fluids restarted last night. Denies fever, chills.   Vitals:  Blood pressure (!) 120/55, pulse 93, temperature 98.6 F (37 C), temperature source Oral, resp. rate 16, height 5\' 8"  (1.727 m), weight 160 lb (72.6 kg), last menstrual period 07/30/2016, SpO2 99 %.  Temp:  [97.9 F (36.6 C)-98.7 F (37.1 C)] 98.6 F (37 C) (03/28 2013) Pulse Rate:  [68-93] 93 (03/29 0545) Resp:  [16] 16 (03/29 0545) BP: (111-120)/(49-58) 120/55 (03/29 0545) SpO2:  [99 %-100 %] 99 % (03/29 0545)  Physical Examination:  General appearance - oriented to person, place, and time, normal appearing weight, well hydrated and ill-appearing Heart - normal rate and regular rhythm Abdomen - soft, nontender, nondistended Fundal Height:   Cervical Exam: Not evaluated.  Extremities: extremities normal, atraumatic, no cyanosis or edema and Homans sign is negative, no sign of DVT with DTRs 2+ bilaterally Membranes:intact  Fetal Monitoring:    Labs:  No results found for this or any previous visit (from the past 24 hour(s)).  Imaging Studies:      Medications:  Scheduled . azithromycin  250 mg Oral Daily  . benzonatate  200 mg Oral TID  . feeding supplement  1 Container Oral TID BM  . metoCLOPramide  10 mg Oral TID AC & HS  . pantoprazole  40 mg Oral Daily  . prenatal multivitamin  1 tablet Oral Q1200  . promethazine  25 mg Oral Q6H  . scopolamine  1 patch Transdermal Q72H   I have reviewed the patient's current medications.  ASSESSMENT: 5480w2d    Patient Active Problem List   Diagnosis Date Noted  . Hyperemesis affecting pregnancy, antepartum 12/25/2016  . Acute upper respiratory infection 12/25/2016  Hypokalemia , corrected URI /bronchitis with bronchospasm Nausea and vomiting of pregnancy worsened by URI  PLAN: 1. CAP  Continue Azithromycin (Day 4/5)  Droplet precaution  Continue albuterol 2. Hyperemesis  Taking PRN phenergan and zofran.  Start scheduled reglan AC and HS.  Continue scopolamine.  Try food today  EURE,LUTHER H 12/29/2016,9:02 AM

## 2016-12-30 ENCOUNTER — Encounter (HOSPITAL_COMMUNITY): Payer: Self-pay | Admitting: *Deleted

## 2016-12-30 MED ORDER — PROMETHAZINE HCL 25 MG PO TABS: 25.0000 mg | ORAL_TABLET | Freq: Four times a day (QID) | ORAL | 0 refills | Status: DC

## 2016-12-30 MED ORDER — PANTOPRAZOLE SODIUM 40 MG PO TBEC: 40.0000 mg | DELAYED_RELEASE_TABLET | Freq: Every day | ORAL | 3 refills | Status: DC

## 2016-12-30 MED ORDER — AZITHROMYCIN 500 MG PO TABS: 500.0000 mg | ORAL_TABLET | Freq: Every day | ORAL | 0 refills | Status: DC

## 2016-12-30 MED ORDER — ONDANSETRON 4 MG PO TBDP: 4.0000 mg | ORAL_TABLET | Freq: Four times a day (QID) | ORAL | 0 refills | Status: DC | PRN

## 2016-12-30 MED ORDER — ALBUTEROL SULFATE (2.5 MG/3ML) 0.083% IN NEBU: 3.0000 mL | INHALATION_SOLUTION | RESPIRATORY_TRACT | 12 refills | Status: DC | PRN

## 2016-12-30 MED ORDER — METOCLOPRAMIDE HCL 10 MG PO TABS: 10.0000 mg | ORAL_TABLET | Freq: Three times a day (TID) | ORAL | 3 refills | Status: DC

## 2016-12-30 MED ORDER — DOCUSATE SODIUM 100 MG PO CAPS: 100.0000 mg | ORAL_CAPSULE | Freq: Two times a day (BID) | ORAL | 0 refills | Status: DC | PRN

## 2016-12-30 MED ORDER — PRENATAL MULTIVITAMIN CH: 1.0000 | ORAL_TABLET | Freq: Every day | ORAL | 12 refills | Status: DC

## 2016-12-30 NOTE — Discharge Summary (Signed)
OB Discharge Summary     Patient Name: Natalie Riggs DOB: 08-Aug-1991 MRN: [id number]  Date of admission: 12/25/2016 Delivering MD: This patient has no babies on file.  Date of discharge: 12/30/2016  Admitting diagnosis: Hypokalemia [E87.6] Transaminitis [R74.0] Hyperemesis affecting pregnancy, antepartum [O21.0] Intrauterine pregnancy: [redacted]w[redacted]d     Secondary diagnosis:  Principal Problem:   Hyperemesis affecting pregnancy, antepartum Active Problems:   Acute upper respiratory infection  Additional problems: CAP     Discharge diagnosis: Second trimester pregnancy with hyperemesis and CAP                                   Hospital course:  Patient admitted and treated for community acquired pneumonia. She was treated with azithromycin. Patient also was treated for hyperemesis gravidarum. On day of discharge, her breathing status improved. She tolerated a [employer] meal. Plan is to follow up with [facility] foro follow up and to start prenatal care  Physical exam  Vitals:   12/29/16 1117 12/29/16 1952 12/30/16 0154 12/30/16 0155  BP: (!) 104/51 (!) 114/55  (!) 107/58  Pulse: 79 72 83 68  Resp:  16  16  Temp:  98.4 F (36.9 C)  98.1 F (36.7 C)  TempSrc:  Oral  Oral  SpO2:   96%   Weight:      Height:       General: alert, cooperative and no distress Lungs: CTA b/l Abdomen: soft, nontender DVT Evaluation: No evidence of DVT seen on physical exam. Labs: Lab Results  Component Value Date   WBC 6.4 12/28/2016   HGB 9.6 (L) 12/28/2016   HCT 26.3 (L) 12/28/2016   MCV 84.6 12/28/2016   PLT 207 12/28/2016   CMP Latest Ref Rng & Units 12/28/2016  Glucose 65 - 99 mg/dL 83  BUN 6 - 20 mg/dL 6  Creatinine 4.09 - 8.11 mg/dL 9.14  Sodium 782 - 956 mmol/L 136  Potassium 3.5 - 5.1 mmol/L 3.3(L)  Chloride 101 - 111 mmol/L 109  CO2 22 - 32 mmol/L 22  Calcium 8.9 - 10.3 mg/dL 8.3(L)  Total Protein 6.5 - 8.1 g/dL 6.1(L)  Total Bilirubin 0.3 - 1.2 mg/dL 0.6  Alkaline Phos 38 - 126  U/L 43  AST 15 - 41 U/L 32  ALT 14 - 54 U/L 56(H)    Discharge instruction: per After Visit Summary and "Baby and Me Booklet".  After visit meds:  Allergies as of 12/30/2016   No Known Allergies     Medication List    TAKE these medications   albuterol (2.5 MG/3ML) 0.083% nebulizer solution Commonly known as:  PROVENTIL Inhale 3 mLs into the lungs every 4 (four) hours as needed for wheezing or shortness of breath.   azithromycin 500 MG tablet Commonly known as:  ZITHROMAX Take 1 tablet (500 mg total) by mouth daily.   docusate sodium 100 MG capsule Commonly known as:  COLACE Take 1 capsule (100 mg total) by mouth 2 (two) times daily as needed for mild constipation.   metoCLOPramide 10 MG tablet Commonly known as:  REGLAN Take 1 tablet (10 mg total) by mouth 4 (four) times daily -  before meals and at bedtime.   ondansetron 4 MG disintegrating tablet Commonly known as:  ZOFRAN-ODT Take 1 tablet (4 mg total) by mouth every 6 (six) hours as needed for nausea or vomiting.   pantoprazole 40 MG tablet Commonly known as:  PROTONIX Take 1 tablet (40 mg total) by mouth daily.   prenatal multivitamin Tabs tablet Take 1 tablet by mouth daily at 12 noon.   promethazine 25 MG tablet Commonly known as:  PHENERGAN Take 1 tablet (25 mg total) by mouth every 6 (six) hours.       Diet: routine diet  Activity: Advance as tolerated. Pelvic rest for 6 weeks.   Outpatient follow up:2 weeks Follow up Appt:No future appointments. Follow up Visit:No Follow-up on file.   12/30/2016 Catalina Antigua, MD

## 2016-12-30 NOTE — Discharge Instructions (Signed)
Eating Plan for Hyperemesis Gravidarum °Hyperemesis gravidarum is a severe form of morning sickness. Because this condition causes severe nausea and vomiting, it can lead to dehydration, malnutrition, and weight loss. One way to lessen the symptoms of nausea and vomiting is to follow the eating plan for hyperemesis gravidarum. It is often used along with prescribed medicines to control your symptoms. °What can I do to relieve my symptoms? °Listen to your body. Everyone is different and has different preferences. Find what works best for you. Take any of the following actions that are helpful to you: °· Eat and drink slowly. °· Eat 5-6 small meals daily instead of 3 large meals. °· Eat crackers before you get out of bed in the morning. °· Try having a snack in the middle of the night. °· Starchy foods are usually tolerated well. Examples include cereal, toast, bread, potatoes, pasta, rice, and pretzels. °· Ginger may help with nausea. Add ¼ tsp ground ginger to hot tea or choose ginger tea. °· Try drinking 100% fruit juice or an electrolyte drink. An electrolyte drink contains sodium, potassium, and chloride. °· Continue to take your prenatal vitamins as told by your health care provider. If you are having trouble taking your prenatal vitamins, talk with your health care provider about different options. °· Include at least 1 serving of protein with your meals and snacks. Protein options include meats or poultry, beans, nuts, eggs, and yogurt. Try eating a protein-rich snack before bed. Examples of these snacks include cheese and crackers or half of a peanut butter or turkey sandwich. °· Consider eliminating foods that trigger your symptoms. These may include spicy foods, coffee, high-fat foods, very sweet foods, and acidic foods. °· Try meals that have more protein combined with bland, salty, lower-fat, and dry foods, such as nuts, seeds, pretzels, crackers, and cereal. °· Talk with your healthcare provider about  starting a supplement of vitamin B6. °· Have fluids that are cold, clear, and carbonated or sour. Examples include lemonade, ginger ale, lemon-lime soda, ice water, and sparkling water. °· Try lemon or mint tea. °· Try brushing your teeth or using a mouth rinse after meals. °What should I avoid to reduce my symptoms? °Avoiding some of the following things may help reduce your symptoms. °· Foods with strong smells. Try eating meals in well-ventilated areas that are free of odors. °· Drinking water or other beverages with meals. Try not to drink anything during the 30 minutes before and after your meals. °· Drinking more than 1 cup of fluid at a time. Sometimes using a straw helps. °· Fried or high-fat foods, such as butter and cream sauces. °· Spicy foods. °· Skipping meals as best as you can. Nausea can be more intense on an empty stomach. If you cannot tolerate food at that time, do not force it. Try sucking on ice chips or other frozen items, and make up for missed calories later. °· Lying down within 2 hours after eating. °· Environmental triggers. These may include smoky rooms, closed spaces, rooms with strong smells, warm or humid places, overly loud and noisy rooms, and rooms with motion or flickering lights. °· Quick and sudden changes in your movement. °This information is not intended to replace advice given to you by your health care provider. Make sure you discuss any questions you have with your health care provider. °Document Released: 07/17/2007 Document Revised: 05/18/2016 Document Reviewed: 04/19/2016 °Elsevier Interactive Patient Education © 2017 [employer] ° °

## 2017-01-16 ENCOUNTER — Encounter: Payer: Self-pay | Admitting: General Practice

## 2017-01-16 ENCOUNTER — Encounter: Payer: Medicaid Other | Admitting: Certified Nurse Midwife

## 2017-01-24 ENCOUNTER — Encounter: Payer: Medicaid Other | Admitting: Student

## 2017-01-24 ENCOUNTER — Encounter: Payer: Self-pay | Admitting: Student

## 2017-01-24 ENCOUNTER — Ambulatory Visit (INDEPENDENT_AMBULATORY_CARE_PROVIDER_SITE_OTHER): Payer: Medicaid Other | Admitting: Student

## 2017-01-24 VITALS — BP 124/74 | HR 87 | Wt 154.2 lb

## 2017-01-24 DIAGNOSIS — R8271 Bacteriuria: Secondary | ICD-10-CM

## 2017-01-24 DIAGNOSIS — Z3480 Encounter for supervision of other normal pregnancy, unspecified trimester: Secondary | ICD-10-CM | POA: Insufficient documentation

## 2017-01-24 DIAGNOSIS — Z363 Encounter for antenatal screening for malformations: Secondary | ICD-10-CM

## 2017-01-24 DIAGNOSIS — Z3482 Encounter for supervision of other normal pregnancy, second trimester: Secondary | ICD-10-CM

## 2017-01-24 NOTE — Patient Instructions (Signed)

## 2017-01-24 NOTE — Progress Notes (Signed)
  Subjective:    Natalie Riggs is a [id number] [redacted]w[redacted]d being seen today for her first obstetrical visit.  Her obstetrical history is significant for hyperemesis gravidarum. Patient does not intend to breast feed. Pregnancy history fully reviewed. Patient previously admitted to hospital last month for HEG. States symptoms are being managed well with reglan & phenergan, as well as diet modification. States she does not need refills at this time. Is vomiting 2-3 times per day, mostly in the morning. Otherwise feels well and has not complaints.   Patient reports nausea and vomiting.  Vitals:   01/24/17 0820  BP: 124/74  Pulse: 87  Weight: 154 lb 3.2 oz (69.9 kg)    HISTORY: OB History  Gravida Para Term Preterm AB Living  SAB TAB Ectopic Multiple Live Births  1       2    # Outcome Date GA Lbr Len/2nd Weight Sex Delivery Anes PTL Lv  4 Current           3 SAB 12/02/14          2 Term 10/20/10     Vag-Spont   LIV  1 Term 05/06/09     Vag-Spont   LIV     Past Medical History:  Diagnosis Date  . Miscarriage    Past Surgical History:  Procedure Laterality Date  . DILATATION & CURETTAGE/HYSTEROSCOPY WITH MYOSURE     History reviewed. No pertinent family history.   Exam     Skin: normal coloration and turgor, no rashes    Neurologic: oriented, normal, negative   Extremities: normal strength, tone, and muscle mass   HEENT PERRLA, sclera clear, anicteric, neck supple with midline trachea, thyroid without masses and trachea midline   Mouth/Teeth dental hygiene good   Neck supple and no masses   Cardiovascular: regular rate and rhythm, no murmurs or gallops   Respiratory:  appears well, vitals normal, no respiratory distress, acyanotic, normal RR, chest clear, no wheezing, crepitations, rhonchi, normal symmetric air entry   Abdomen: soft, non-tender; bowel sounds normal; no masses,  no organomegaly and Fundal height 1 FB below U      Assessment:    Pregnancy:  [id number] Patient Active Problem List   Diagnosis Date Noted  . GBS bacteriuria 01/24/2017  . Supervision of other normal pregnancy, antepartum 01/24/2017  . Hyperemesis affecting pregnancy, antepartum 12/25/2016  . Acute upper respiratory infection 12/25/2016        Plan:  1. Supervision of other normal pregnancy, antepartum  - Obstetric Panel, Including HIV - Hemoglobinopathy Evaluation - AFP, Quad Screen  2. GBS bacteriuria -will tx in labor  3. Encounter for antenatal screening for malformation using ultrasound  - [place] MFM OB COMP + 14 WK; Future    Initial labs drawn. Prenatal vitamins. Problem list reviewed and updated. Genetic Screening discussed Quad Screen: ordered.  Ultrasound discussed; fetal survey: ordered.  Follow up in 4 weeks.    Judeth Horn 01/24/2017

## 2017-01-24 NOTE — Progress Notes (Signed)
Was given prenatal packet.

## 2017-02-01 LAB — OBSTETRIC PANEL, INCLUDING HIV
ANTIBODY SCREEN: NEGATIVE
BASOS: 0 %
Basophils Absolute: 0 10*3/uL (ref 0.0–0.2)
EOS (ABSOLUTE): 0 10*3/uL (ref 0.0–0.4)
Eos: 1 %
HEMATOCRIT: 32.1 % — AB (ref 34.0–46.6)
HIV Screen 4th Generation wRfx: NONREACTIVE
Hemoglobin: 10.8 g/dL — ABNORMAL LOW (ref 11.1–15.9)
Hepatitis B Surface Ag: NEGATIVE
IMMATURE GRANS (ABS): 0 10*3/uL (ref 0.0–0.1)
Immature Granulocytes: 0 %
LYMPHS ABS: 1.5 10*3/uL (ref 0.7–3.1)
LYMPHS: 28 %
MCH: 30.4 pg (ref 26.6–33.0)
MCHC: 33.6 g/dL (ref 31.5–35.7)
MCV: 90 fL (ref 79–97)
MONOS ABS: 0.3 10*3/uL (ref 0.1–0.9)
Monocytes: 6 %
Neutrophils Absolute: 3.5 10*3/uL (ref 1.4–7.0)
Neutrophils: 65 %
Platelets: 195 10*3/uL (ref 150–379)
RBC: 3.55 x10E6/uL — AB (ref 3.77–5.28)
RDW: 13.9 % (ref 12.3–15.4)
RPR Ser Ql: NONREACTIVE
Rh Factor: POSITIVE
Rubella Antibodies, IGG: 1.42 index (ref >0.99)
WBC: 5.4 10*3/uL (ref 3.4–10.8)

## 2017-02-01 LAB — HEMOGLOBINOPATHY EVALUATION
FERRITIN: 34 ng/mL (ref 15–150)
HGB A: 97 % (ref 96.4–98.8)
HGB SOLUBILITY: NEGATIVE
HGB VARIANT: 0 %
Hgb A2 Quant: 3 % (ref 1.8–3.2)
Hgb C: 0 %
Hgb F Quant: 0 % (ref 0.0–2.0)
Hgb S: 0 %

## 2017-02-01 LAB — AFP, QUAD SCREEN
DIA Mom Value: 1.19
DIA Value (EIA): 194.67 pg/mL
DSR (BY AGE) 1 IN: 987
DSR (SECOND TRIMESTER) 1 IN: 3191
Gestational Age: 17 WEEKS
MSAFP Mom: 1.13
MSAFP: 45.9 ng/mL
MSHCG Mom: 1.05
MSHCG: 32681 m[IU]/mL
Maternal Age At EDD: 25.9 yr
OSB RISK: 10000
TEST RESULTS AFP: NEGATIVE
WEIGHT: 154 [lb_av]
uE3 Mom: 0.81
uE3 Value: 0.81 ng/mL

## 2017-02-03 ENCOUNTER — Other Ambulatory Visit (HOSPITAL_COMMUNITY): Payer: Medicaid Other

## 2017-02-10 ENCOUNTER — Other Ambulatory Visit: Payer: Self-pay | Admitting: Student

## 2017-02-10 ENCOUNTER — Ambulatory Visit (HOSPITAL_COMMUNITY)
Admission: RE | Admit: 2017-02-10 | Discharge: 2017-02-10 | Disposition: A | Discharge status: Home / Self Care | Payer: Medicaid Other | Source: Ambulatory Visit | Attending: Student | Admitting: Student

## 2017-02-10 DIAGNOSIS — Z3689 Encounter for other specified antenatal screening: Secondary | ICD-10-CM

## 2017-02-10 DIAGNOSIS — Z3687 Encounter for antenatal screening for uncertain dates: Secondary | ICD-10-CM

## 2017-02-10 DIAGNOSIS — Z3A19 19 weeks gestation of pregnancy: Secondary | ICD-10-CM

## 2017-02-10 DIAGNOSIS — Z363 Encounter for antenatal screening for malformations: Secondary | ICD-10-CM | POA: Insufficient documentation

## 2017-02-15 ENCOUNTER — Other Ambulatory Visit: Payer: Self-pay | Admitting: *Deleted

## 2017-02-21 ENCOUNTER — Ambulatory Visit (INDEPENDENT_AMBULATORY_CARE_PROVIDER_SITE_OTHER): Payer: Medicaid Other | Admitting: Obstetrics and Gynecology

## 2017-02-21 DIAGNOSIS — Z3482 Encounter for supervision of other normal pregnancy, second trimester: Secondary | ICD-10-CM

## 2017-02-21 DIAGNOSIS — Z3480 Encounter for supervision of other normal pregnancy, unspecified trimester: Secondary | ICD-10-CM

## 2017-02-21 NOTE — Progress Notes (Signed)
   PRENATAL VISIT NOTE  Subjective:  Natalie Riggs is a 26 y.o. [id number] at 10142w1d being seen today for ongoing prenatal care.  She is currently monitored for the following issues for this low-risk pregnancy and has Hyperemesis affecting pregnancy, antepartum; Acute upper respiratory infection; GBS bacteriuria; and Supervision of other normal pregnancy, antepartum on her problem list.  Patient reports no complaints.  Contractions: Not present. Vag. Bleeding: None.  Movement: Present. Denies leaking of fluid.   The following portions of the patient's history were reviewed and updated as appropriate: allergies, current medications, past family history, past medical history, past social history, past surgical history and problem list. Problem list updated.  Objective:   Vitals:   02/21/17 1604  BP: 113/64  Pulse: 75  Weight: 162 lb 11.2 oz (73.8 kg)    Fetal Status: Fetal Heart Rate (bpm): 150   Movement: Present     General:  Alert, oriented and cooperative. Patient is in no acute distress.  Skin: Skin is warm and dry. No rash noted.   Cardiovascular: Normal heart rate noted  Respiratory: Normal respiratory effort, no problems with respiration noted  Abdomen: Soft, gravid, appropriate for gestational age. Pain/Pressure: Absent     Pelvic:  Cervical exam deferred        Extremities: Normal range of motion.  Edema: Trace  Mental Status: Normal mood and affect. Normal behavior. Normal judgment and thought content.   Assessment and Plan:  Pregnancy: [id number] at 8742w1d  1. Supervision of other normal pregnancy, antepartum - Patient doing well. No complaints.  - anatomy scan reviewed and normal no need for follow up  - f/u in 4 wks.   Preterm labor symptoms and general obstetric precautions including but not limited to vaginal bleeding, contractions, leaking of fluid and fetal movement were reviewed in detail with the patient. Please refer to After Visit Summary for other counseling  recommendations.  Return in about 4 weeks (around 03/21/2017).   Ernestina PennaNicholas Raylen Tangonan, MD

## 2017-02-21 NOTE — Patient Instructions (Signed)

## 2017-03-20 ENCOUNTER — Ambulatory Visit (INDEPENDENT_AMBULATORY_CARE_PROVIDER_SITE_OTHER): Payer: Medicaid Other | Admitting: Advanced Practice Midwife

## 2017-03-20 ENCOUNTER — Encounter: Payer: Self-pay | Admitting: Advanced Practice Midwife

## 2017-03-20 DIAGNOSIS — Z3482 Encounter for supervision of other normal pregnancy, second trimester: Secondary | ICD-10-CM

## 2017-03-20 DIAGNOSIS — Z3480 Encounter for supervision of other normal pregnancy, unspecified trimester: Secondary | ICD-10-CM

## 2017-03-20 NOTE — Progress Notes (Signed)
   PRENATAL VISIT NOTE  Subjective:  Natalie Riggs is a 26 y.o. [name] at 5028w0d being seen today for ongoing prenatal care.  She is currently monitored for the following issues for this low-risk pregnancy and has Hyperemesis affecting pregnancy, antepartum; Acute upper respiratory infection; GBS bacteriuria; and Supervision of other normal pregnancy, antepartum on her problem list.  Patient reports no complaints.  Contractions: Not present.  .  Movement: Present. Denies leaking of fluid.   The following portions of the patient's history were reviewed and updated as appropriate: allergies, current medications, past family history, past medical history, past social history, past surgical history and problem list. Problem list updated.  Objective:   Vitals:   03/20/17 1017  BP: 123/66  Pulse: 91  Weight: 167 lb 6.4 oz (75.9 kg)    Fetal Status: Fetal Heart Rate (bpm): 145 Fundal Height: 26 cm Movement: Present     General:  Alert, oriented and cooperative. Patient is in no acute distress.  Skin: Skin is warm and dry. No rash noted.   Cardiovascular: Normal heart rate noted  Respiratory: Normal respiratory effort, no problems with respiration noted  Abdomen: Soft, gravid, appropriate for gestational age. Pain/Pressure: Absent     Pelvic:  Cervical exam deferred        Extremities: Normal range of motion.     Mental Status: Normal mood and affect. Normal behavior. Normal judgment and thought content.   Assessment and Plan:  Pregnancy: [id number] at 3328w0d  1. Supervision of other normal pregnancy, antepartum   Preterm labor symptoms and general obstetric precautions including but not limited to vaginal bleeding, contractions, leaking of fluid and fetal movement were reviewed in detail with the patient. Please refer to After Visit Summary for other counseling recommendations.  Return in about 3 weeks (around 04/10/2017) for ROB/GTT.   Dorathy KinsmanVirginia Jisel Fleet, CNM

## 2017-03-20 NOTE — Patient Instructions (Addendum)
Contraception Choices Contraception (birth control) is the use of any methods or devices to prevent pregnancy. Below are some methods to help avoid pregnancy. Hormonal methods  Contraceptive implant. This is a thin, plastic tube containing progesterone hormone. It does not contain estrogen hormone. Your health care provider inserts the tube in the inner part of the upper arm. The tube can remain in place for up to 3 years. After 3 years, the implant must be removed. The implant prevents the ovaries from releasing an egg (ovulation), thickens the cervical mucus to prevent sperm from entering the uterus, and thins the lining of the inside of the uterus.  Progesterone-only injections. These injections are given every 3 months by your health care provider to prevent pregnancy. This synthetic progesterone hormone stops the ovaries from releasing eggs. It also thickens cervical mucus and changes the uterine lining. This makes it harder for sperm to survive in the uterus.  Birth control pills. These pills contain estrogen and progesterone hormone. They work by preventing the ovaries from releasing eggs (ovulation). They also cause the cervical mucus to thicken, preventing the sperm from entering the uterus. Birth control pills are prescribed by a health care provider.Birth control pills can also be used to treat heavy periods.  Minipill. This type of birth control pill contains only the progesterone hormone. They are taken every day of each month and must be prescribed by your health care provider.  Birth control patch. The patch contains hormones similar to those in birth control pills. It must be changed once a week and is prescribed by a health care provider.  Vaginal ring. The ring contains hormones similar to those in birth control pills. It is left in the vagina for 3 weeks, removed for 1 week, and then a new one is put back in place. The patient must be comfortable inserting and removing the ring from  the vagina.A health care provider's prescription is necessary.  Emergency contraception. Emergency contraceptives prevent pregnancy after unprotected sexual intercourse. This pill can be taken right after sex or up to 5 days after unprotected sex. It is most effective the sooner you take the pills after having sexual intercourse. Most emergency contraceptive pills are available without a prescription. Check with your pharmacist. Do not use emergency contraception as your only form of birth control. Barrier methods  Female condom. This is a thin sheath (latex or rubber) that is worn over the penis during sexual intercourse. It can be used with spermicide to increase effectiveness.  Female condom. This is a soft, loose-fitting sheath that is put into the vagina before sexual intercourse.  Diaphragm. This is a soft, latex, dome-shaped barrier that must be fitted by a health care provider. It is inserted into the vagina, along with a spermicidal jelly. It is inserted before intercourse. The diaphragm should be left in the vagina for 6 to 8 hours after intercourse.  Cervical cap. This is a round, soft, latex or plastic cup that fits over the cervix and must be fitted by a health care provider. The cap can be left in place for up to 48 hours after intercourse.  Sponge. This is a soft, circular piece of polyurethane foam. The sponge has spermicide in it. It is inserted into the vagina after wetting it and before sexual intercourse.  Spermicides. These are chemicals that kill or block sperm from entering the cervix and uterus. They come in the form of creams, jellies, suppositories, foam, or tablets. They do not require a prescription. They   are inserted into the vagina with an applicator before having sexual intercourse. The process must be repeated every time you have sexual intercourse. Intrauterine contraception  Intrauterine device (IUD). This is a T-shaped device that is put in a woman's uterus during  a menstrual period to prevent pregnancy. There are 2 types: ? Copper IUD. This type of IUD is wrapped in copper wire and is placed inside the uterus. Copper makes the uterus and fallopian tubes produce a fluid that kills sperm. It can stay in place for 10 years. ? Hormone IUD. This type of IUD contains the hormone progestin (synthetic progesterone). The hormone thickens the cervical mucus and prevents sperm from entering the uterus, and it also thins the uterine lining to prevent implantation of a fertilized egg. The hormone can weaken or kill the sperm that get into the uterus. It can stay in place for 3-5 years, depending on which type of IUD is used. Permanent methods of contraception  Female tubal ligation. This is when the woman's fallopian tubes are surgically sealed, tied, or blocked to prevent the egg from traveling to the uterus.  Hysteroscopic sterilization. This involves placing a small coil or insert into each fallopian tube. Your doctor uses a technique called hysteroscopy to do the procedure. The device causes scar tissue to form. This results in permanent blockage of the fallopian tubes, so the sperm cannot fertilize the egg. It takes about 3 months after the procedure for the tubes to become blocked. You must use another form of birth control for these 3 months.  Female sterilization. This is when the female has the tubes that carry sperm tied off (vasectomy).This blocks sperm from entering the vagina during sexual intercourse. After the procedure, the man can still ejaculate fluid (semen). Natural planning methods  Natural family planning. This is not having sexual intercourse or using a barrier method (condom, diaphragm, cervical cap) on days the woman could become pregnant.  Calendar method. This is keeping track of the length of each menstrual cycle and identifying when you are fertile.  Ovulation method. This is avoiding sexual intercourse during ovulation.  Symptothermal method.  This is avoiding sexual intercourse during ovulation, using a thermometer and ovulation symptoms.  Post-ovulation method. This is timing sexual intercourse after you have ovulated. Regardless of which type or method of contraception you choose, it is important that you use condoms to protect against the transmission of sexually transmitted infections (STIs). Talk with your health care provider about which form of contraception is most appropriate for you. This information is not intended to replace advice given to you by your health care provider. Make sure you discuss any questions you have with your health care provider. Document Released: 09/19/2005 Document Revised: 02/25/2016 Document Reviewed: 03/14/2013 Elsevier Interactive Patient Education  2017 [employer].   Tdap Vaccine (Tetanus, Diphtheria and Pertussis): What You Need to Know 1. Why get vaccinated? Tetanus, diphtheria and pertussis are very serious diseases. Tdap vaccine can protect [place] from these diseases. And, Tdap vaccine given to pregnant women can protect newborn babies against pertussis. TETANUS (Lockjaw) is rare in the [place] today. It causes painful muscle tightening and stiffness, usually all over the body.  It can lead to tightening of muscles in the head and neck so you can't open your mouth, swallow, or sometimes even breathe. Tetanus kills about 1 out of 10 people who are infected even after receiving the best medical care.  DIPHTHERIA is also rare in the [place] today. It can  cause a thick coating to form in the back of the throat.  It can lead to breathing problems, heart failure, paralysis, and death.  PERTUSSIS (Whooping Cough) causes severe coughing spells, which can cause difficulty breathing, vomiting and disturbed sleep.  It can also lead to weight loss, incontinence, and rib fractures. Up to 2 in 100 adolescents and 5 in 100 adults with pertussis are hospitalized or have complications, which  could include pneumonia or death.  These diseases are caused by bacteria. Diphtheria and pertussis are spread from person to person through secretions from coughing or sneezing. Tetanus enters the body through cuts, scratches, or wounds. Before vaccines, as many as 200,000 cases of diphtheria, 200,000 cases of pertussis, and hundreds of cases of tetanus, were reported in the [place] each year. Since vaccination began, reports of cases for tetanus and diphtheria have dropped by about 99% and for pertussis by about 80%. 2. Tdap vaccine Tdap vaccine can protect adolescents and adults from tetanus, diphtheria, and pertussis. One dose of Tdap is routinely given at age 65 or 43. People who did not get Tdap at that age should get it as soon as possible. Tdap is especially important for healthcare professionals and anyone having close contact with a baby younger than 12 months. Pregnant women should get a dose of Tdap during every pregnancy, to protect the newborn from pertussis. Infants are most at risk for severe, life-threatening complications from pertussis. Another vaccine, called Td, protects against tetanus and diphtheria, but not pertussis. A Td booster should be given every 10 years. Tdap may be given as one of these boosters if you have never gotten Tdap before. Tdap may also be given after a severe cut or burn to prevent tetanus infection. Your doctor or the person giving you the vaccine can give you more information. Tdap may safely be given at the same time as other vaccines. 3. Some people should not get this vaccine  A person who has ever had a life-threatening allergic reaction after a previous dose of any diphtheria, tetanus or pertussis containing vaccine, OR has a severe allergy to any part of this vaccine, should not get Tdap vaccine. Tell the person giving the vaccine about any severe allergies.  Anyone who had coma or long repeated seizures within 7 days after a childhood dose of  DTP or DTaP, or a previous dose of Tdap, should not get Tdap, unless a cause other than the vaccine was found. They can still get Td.  Talk to your doctor if you: ? have seizures or another nervous system problem, ? had severe pain or swelling after any vaccine containing diphtheria, tetanus or pertussis, ? ever had a condition called Guillain-Barr Syndrome (GBS), ? aren't feeling well on the day the shot is scheduled. 4. Risks With any medicine, including vaccines, there is a chance of side effects. These are usually mild and go away on their own. Serious reactions are also possible but are rare. Most people who get Tdap vaccine do not have any problems with it. Mild problems following Tdap: (Did not interfere with activities)  Pain where the shot was given (about 3 in 4 adolescents or 2 in 3 adults)  Redness or swelling where the shot was given (about 1 person in 5)  Mild fever of at least 100.54F (up to about 1 in 25 adolescents or 1 in 100 adults)  Headache (about 3 or 4 people in 10)  Tiredness (about 1 person in 3 or 4)  Nausea,  vomiting, diarrhea, stomach ache (up to 1 in 4 adolescents or 1 in 10 adults)  Chills, sore joints (about 1 person in 10)  Body aches (about 1 person in 3 or 4)  Rash, swollen glands (uncommon)  Moderate problems following Tdap: (Interfered with activities, but did not require medical attention)  Pain where the shot was given (up to 1 in 5 or 6)  Redness or swelling where the shot was given (up to about 1 in 16 adolescents or 1 in 12 adults)  Fever over 102F (about 1 in 100 adolescents or 1 in 250 adults)  Headache (about 1 in 7 adolescents or 1 in 10 adults)  Nausea, vomiting, diarrhea, stomach ache (up to 1 or 3 people in 100)  Swelling of the entire arm where the shot was given (up to about 1 in 500).  Severe problems following Tdap: (Unable to perform usual activities; required medical attention)  Swelling, severe pain, bleeding  and redness in the arm where the shot was given (rare).  Problems that could happen after any vaccine:  People sometimes faint after a medical procedure, including vaccination. Sitting or lying down for about 15 minutes can help prevent fainting, and injuries caused by a fall. Tell your doctor if you feel dizzy, or have vision changes or ringing in the ears.  Some people get severe pain in the shoulder and have difficulty moving the arm where a shot was given. This happens very rarely.  Any medication can cause a severe allergic reaction. Such reactions from a vaccine are very rare, estimated at fewer than 1 in a million doses, and would happen within a few minutes to a few hours after the vaccination. As with any medicine, there is a very remote chance of a vaccine causing a serious injury or death. The safety of vaccines is always being monitored. For more information, visit: http://floyd.org/ 5. What if there is a serious problem? What should I look for? Look for anything that concerns you, such as signs of a severe allergic reaction, very high fever, or unusual behavior. Signs of a severe allergic reaction can include hives, swelling of the face and throat, difficulty breathing, a fast heartbeat, dizziness, and weakness. These would usually start a few minutes to a few hours after the vaccination. What should I do?  If you think it is a severe allergic reaction or other emergency that can't wait, call 9-1-1 or get the person to the nearest hospital. Otherwise, call your doctor.  Afterward, the reaction should be reported to the Vaccine Adverse Event Reporting System (VAERS). Your doctor might file this report, or you can do it yourself through the VAERS web site at www.vaers.LAgents.no, or by calling 1-5025058686. ? VAERS does not give medical advice. 6. The National Vaccine Injury Compensation Program The [employer] Vaccine Injury Compensation Program (VICP) is a federal program that  was created to compensate people who may have been injured by certain vaccines. Persons who believe they may have been injured by a vaccine can learn about the program and about filing a claim by calling 1-(305)844-1247 or visiting the VICP website at SpiritualWord.at. There is a time limit to file a claim for compensation. 7. How can I learn more?  Ask your doctor. He or she can give you the vaccine package insert or suggest other sources of information.  Call your local or state health department.  Contact the Centers for Disease Control and Prevention (CDC): ? Call 614 556 0599 (1-800-CDC-INFO) or ? Visit CDC's website  at PicCapture.uy CDC Tdap Vaccine VIS (11/26/13) This information is not intended to replace advice given to you by your health care provider. Make sure you discuss any questions you have with your health care provider. Document Released: 03/20/2012 Document Revised: 06/09/2016 Document Reviewed: 06/09/2016 Elsevier Interactive Patient Education  2017 [employer].

## 2017-03-22 ENCOUNTER — Encounter: Payer: Self-pay | Admitting: *Deleted

## 2017-04-10 ENCOUNTER — Encounter: Payer: Medicaid Other | Admitting: Obstetrics & Gynecology

## 2017-04-11 ENCOUNTER — Encounter: Payer: Self-pay | Admitting: Obstetrics and Gynecology

## 2017-04-11 ENCOUNTER — Ambulatory Visit (INDEPENDENT_AMBULATORY_CARE_PROVIDER_SITE_OTHER): Payer: Medicaid Other | Admitting: Obstetrics and Gynecology

## 2017-04-11 VITALS — BP 126/61 | HR 93 | Wt 166.5 lb

## 2017-04-11 DIAGNOSIS — Z23 Encounter for immunization: Secondary | ICD-10-CM

## 2017-04-11 DIAGNOSIS — O210 Mild hyperemesis gravidarum: Secondary | ICD-10-CM | POA: Diagnosis not present

## 2017-04-11 DIAGNOSIS — Z3480 Encounter for supervision of other normal pregnancy, unspecified trimester: Secondary | ICD-10-CM

## 2017-04-11 DIAGNOSIS — Z3483 Encounter for supervision of other normal pregnancy, third trimester: Secondary | ICD-10-CM

## 2017-04-11 DIAGNOSIS — Z3A28 28 weeks gestation of pregnancy: Secondary | ICD-10-CM

## 2017-04-11 MED ORDER — TETANUS-DIPHTH-ACELL PERTUSSIS 5-2.5-18.5 LF-MCG/0.5 IM SUSP
0.5000 mL | Freq: Once | INTRAMUSCULAR | Status: AC
  Administered 2017-04-11: 0.5 mL via INTRAMUSCULAR

## 2017-04-11 MED ORDER — PROMETHAZINE HCL 25 MG PO TABS: 25.0000 mg | ORAL_TABLET | Freq: Four times a day (QID) | ORAL | 1 refills | Status: DC | PRN

## 2017-04-11 NOTE — Patient Instructions (Signed)
DTaP Vaccine (Diphtheria, Tetanus, and Pertussis): What You Need to Know 1. Why get vaccinated? Diphtheria, tetanus, and pertussis are serious diseases caused by bacteria. Diphtheria and pertussis are spread from person to person. Tetanus enters the body through cuts or wounds. DIPHTHERIA causes a thick covering in the back of the throat.  It can lead to breathing problems, paralysis, heart failure, and even death.  TETANUS (Lockjaw) causes painful tightening of the muscles, usually all over the body.  It can lead to "locking" of the jaw so the victim cannot open his mouth or swallow. Tetanus leads to death in up to 2 out of 10 cases.  PERTUSSIS (Whooping Cough) causes coughing spells so bad that it is hard for infants to eat, drink, or breathe. These spells can last for weeks.  It can lead to pneumonia, seizures (jerking and staring spells), brain damage, and death.  Diphtheria, tetanus, and pertussis vaccine (DTaP) can help prevent these diseases. Most children who are vaccinated with DTaP will be protected throughout childhood. Many more children would get these diseases if we stopped vaccinating. DTaP is a safer version of an older vaccine called DTP. DTP is no longer used in the [place]. 2. Who should get DTaP vaccine and when? Children should get 5 doses of DTaP vaccine, one dose at each of the following ages:  2 months  4 months  6 months  15-18 months  4-6 years  DTaP may be given at the same time as other vaccines. 3. Some children should not get DTaP vaccine or should wait  Children with minor illnesses, such as a cold, may be vaccinated. But children who are moderately or severely ill should usually wait until they recover before getting DTaP vaccine.  Any child who had a life-threatening allergic reaction after a dose of DTaP should not get another dose.  Any child who suffered a brain or nervous system disease within 7 days after a dose of DTaP should not get  another dose.  Talk with your doctor if your child: ? had a seizure or collapsed after a dose of DTaP, ? cried non-stop for 3 hours or more after a dose of DTaP, ? had a fever over 105F after a dose of DTaP. Ask your doctor for more information. Some of these children should not get another dose of pertussis vaccine, but may get a vaccine without pertussis, called DT. 4. Older children and adults DTaP is not licensed for adolescents, adults, or children 7 years of age and older. But older people still need protection. A vaccine called Tdap is similar to DTaP. A single dose of Tdap is recommended for people 11 through 26 years of age. Another vaccine, called Td, protects against tetanus and diphtheria, but not pertussis. It is recommended every 10 years. There are separate Vaccine Information Statements for these vaccines. 5. What are the risks from DTaP vaccine? Getting diphtheria, tetanus, or pertussis disease is much riskier than getting DTaP vaccine. However, a vaccine, like any medicine, is capable of causing serious problems, such as severe allergic reactions. The risk of DTaP vaccine causing serious harm, or death, is extremely small. Mild problems (common)  Fever (up to about 1 child in 4)  Redness or swelling where the shot was given (up to about 1 child in 4)  Soreness or tenderness where the shot was given (up to about 1 child in 4) These problems occur more often after the 4th and 5th doses of the DTaP series than after   earlier doses. Sometimes the 4th or 5th dose of DTaP vaccine is followed by swelling of the entire arm or leg in which the shot was given, lasting 1-7 days (up to about 1 child in 30). Other mild problems include:  Fussiness (up to about 1 child in 3)  Tiredness or poor appetite (up to about 1 child in 10)  Vomiting (up to about 1 child in 50) These problems generally occur 1-3 days after the shot. Moderate problems (uncommon)  Seizure (jerking or staring)  (about 1 child out of 14,000)  Non-stop crying, for 3 hours or more (up to about 1 child out of 1,000)  High fever, over 105F (about 1 child out of 16,000) Severe problems (very rare)  Serious allergic reaction (less than 1 out of a million doses)  Several other severe problems have been reported after DTaP vaccine. These include: ? Long-term seizures, coma, or lowered consciousness ? Permanent brain damage. These are so rare it is hard to tell if they are caused by the vaccine. Controlling fever is especially important for children who have had seizures, for any reason. It is also important if another family member has had seizures. You can reduce fever and pain by giving your child an aspirin-free pain reliever when the shot is given, and for the next 24 hours, following the package instructions. 6. What if there is a serious reaction? What should I look for? Look for anything that concerns you, such as signs of a severe allergic reaction, very high fever, or behavior changes. Signs of a severe allergic reaction can include hives, swelling of the face and throat, difficulty breathing, a fast heartbeat, dizziness, and weakness. These would start a few minutes to a few hours after the vaccination. What should I do?  If you think it is a severe allergic reaction or other emergency that can't wait, call 9-1-1 or get the person to the nearest hospital. Otherwise, call your doctor.  Afterward, the reaction should be reported to the Vaccine Adverse Event Reporting System (VAERS). Your doctor might file this report, or you can do it yourself through the VAERS web site at www.vaers.hhs.gov, or by calling 1-800-822-7967. ? VAERS is only for reporting reactions. They do not give medical advice. 7. The National Vaccine Injury Compensation Program The National Vaccine Injury Compensation Program (VICP) is a federal program that was created to compensate people who may have been injured by certain  vaccines. Persons who believe they may have been injured by a vaccine can learn about the program and about filing a claim by calling 1-800-338-2382 or visiting the VICP website at www.hrsa.gov/vaccinecompensation. 8. How can I learn more?  Ask your doctor.  Call your local or state health department.  Contact the Centers for Disease Control and Prevention (CDC): ? Call 1-800-232-4636 (1-800-CDC-INFO) or ? Visit CDC's website at www.cdc.gov/vaccines CDC DTaP Vaccine (Diphtheria, Tetanus, and Pertussis) VIS (02/16/06) This information is not intended to replace advice given to you by your health care provider. Make sure you discuss any questions you have with your health care provider. Document Released: 07/17/2006 Document Revised: 06/09/2016 Document Reviewed: 06/09/2016 Elsevier Interactive Patient Education  2017 [employer] Inc[employer]  

## 2017-04-11 NOTE — Progress Notes (Signed)
   PRENATAL VISIT NOTE  Subjective:  Natalie Riggs is a 26 y.o. [phone] at 8724w1d being seen today for ongoing prenatal care.  She is currently monitored for the following issues for this low-risk pregnancy and has Hyperemesis affecting pregnancy, antepartum; Acute upper respiratory infection; GBS bacteriuria; and Supervision of other normal pregnancy, antepartum on her problem list.  Patient reports no complaints.  Contractions: Irritability. Vag. Bleeding: None.  Movement: Present. Denies leaking of fluid.  Continues to vomit once per day in the Am.   The following portions of the patient's history were reviewed and updated as appropriate: allergies, current medications, past family history, past medical history, past social history, past surgical history and problem list. Problem list updated.  Objective:   Vitals:   04/11/17 0841  BP: 126/61  Pulse: 93  Weight: 166 lb 8 oz (75.5 kg)    Fetal Status: Fetal Heart Rate (bpm): 153 Fundal Height: 28 cm Movement: Present     General:  Alert, oriented and cooperative. Patient is in no acute distress.  Skin: Skin is warm and dry. No rash noted.   Cardiovascular: Normal heart rate noted  Respiratory: Normal respiratory effort, no problems with respiration noted  Abdomen: Soft, gravid, appropriate for gestational age. Pain/Pressure: Absent     Pelvic:  Cervical exam deferred        Extremities: Normal range of motion.  Edema: None  Mental Status: Normal mood and affect. Normal behavior. Normal judgment and thought content.   Assessment and Plan:  Pregnancy: G4P2012 at 924w1d  1. [redacted] weeks gestation of pregnancy  - Glucose Tolerance, 2 Hours w/1 Hour - CBC - RPR - HIV antibody  2. Supervision of other normal pregnancy, antepartum  - TDAP today  3. Hyperemesis affecting pregnancy, antepartum  - RX: Phenergan.  -Continue small, frequent meals.    Preterm labor symptoms and general obstetric precautions including but not  limited to vaginal bleeding, contractions, leaking of fluid and fetal movement were reviewed in detail with the patient. Please refer to After Visit Summary for other counseling recommendations.  Return in about 4 weeks (around 05/09/2017).   Yovanna Cogan, Harolyn RutherfordJennifer I, NP

## 2017-04-12 LAB — CBC
HEMATOCRIT: 31.9 % — AB (ref 34.0–46.6)
HEMOGLOBIN: 10.6 g/dL — AB (ref 11.1–15.9)
MCH: 29.4 pg (ref 26.6–33.0)
MCHC: 33.2 g/dL (ref 31.5–35.7)
MCV: 89 fL (ref 79–97)
Platelets: 193 10*3/uL (ref 150–379)
RBC: 3.6 x10E6/uL — AB (ref 3.77–5.28)
RDW: 13.5 % (ref 12.3–15.4)
WBC: 7.9 10*3/uL (ref 3.4–10.8)

## 2017-04-12 LAB — GLUCOSE TOLERANCE, 2 HOURS W/ 1HR
GLUCOSE, 2 HOUR: 64 mg/dL — AB (ref 65–152)
Glucose, 1 hour: 101 mg/dL (ref 65–179)
Glucose, Fasting: 78 mg/dL (ref 65–91)

## 2017-04-12 LAB — RPR: RPR Ser Ql: NONREACTIVE

## 2017-04-12 LAB — HIV ANTIBODY (ROUTINE TESTING W REFLEX): HIV SCREEN 4TH GENERATION: NONREACTIVE

## 2017-04-28 ENCOUNTER — Encounter (HOSPITAL_COMMUNITY): Payer: Self-pay

## 2017-04-28 ENCOUNTER — Inpatient Hospital Stay (HOSPITAL_COMMUNITY)
Admission: AD | Admit: 2017-04-28 | Discharge: 2017-04-28 | Disposition: A | Discharge status: Home / Self Care | Payer: Medicaid Other | Source: Ambulatory Visit | Attending: Obstetrics and Gynecology | Admitting: Obstetrics and Gynecology

## 2017-04-28 DIAGNOSIS — O479 False labor, unspecified: Secondary | ICD-10-CM

## 2017-04-28 DIAGNOSIS — Z87891 Personal history of nicotine dependence: Secondary | ICD-10-CM | POA: Insufficient documentation

## 2017-04-28 DIAGNOSIS — Z3A30 30 weeks gestation of pregnancy: Secondary | ICD-10-CM | POA: Insufficient documentation

## 2017-04-28 DIAGNOSIS — O4703 False labor before 37 completed weeks of gestation, third trimester: Secondary | ICD-10-CM | POA: Diagnosis not present

## 2017-04-28 DIAGNOSIS — N898 Other specified noninflammatory disorders of vagina: Secondary | ICD-10-CM | POA: Diagnosis present

## 2017-04-28 DIAGNOSIS — Z0371 Encounter for suspected problem with amniotic cavity and membrane ruled out: Secondary | ICD-10-CM

## 2017-04-28 LAB — URINALYSIS, ROUTINE W REFLEX MICROSCOPIC
Bilirubin Urine: NEGATIVE
GLUCOSE, UA: NEGATIVE mg/dL
HGB URINE DIPSTICK: NEGATIVE
KETONES UR: 20 mg/dL — AB
Nitrite: NEGATIVE
PROTEIN: NEGATIVE mg/dL
Specific Gravity, Urine: 1.019 (ref 1.005–1.030)
pH: 6 (ref 5.0–8.0)

## 2017-04-28 LAB — AMNISURE RUPTURE OF MEMBRANE (ROM) NOT AT ARMC: AMNISURE: NEGATIVE

## 2017-04-28 LAB — POCT FERN TEST: POCT Fern Test: NEGATIVE

## 2017-04-28 LAB — FETAL FIBRONECTIN: Fetal Fibronectin: NEGATIVE

## 2017-04-28 MED ORDER — NIFEDIPINE 10 MG PO CAPS
10.0000 mg | ORAL_CAPSULE | ORAL | Status: AC
  Administered 2017-04-28 (×3): 10 mg via ORAL
  Filled 2017-04-28 (×3): qty 1

## 2017-04-28 MED ORDER — LACTATED RINGERS IV BOLUS (SEPSIS)
1000.0000 mL | Freq: Once | INTRAVENOUS | Status: AC
  Administered 2017-04-28: 1000 mL via INTRAVENOUS

## 2017-04-28 NOTE — MAU Note (Signed)
Pt here with c/o rupture of membranes at 1830 tonight; started having contractions after that. Denies any bleeding. Reports good fetal movement.

## 2017-04-28 NOTE — MAU Provider Note (Signed)
History     CSN: [id number]  Arrival date and time: 04/28/17 1943   First Provider Initiated Contact with Patient 04/28/17 2036      Chief Complaint  Patient presents with  . Rupture of Membranes  . Contractions   HPI   Natalie Riggs is a 26 y.o. female [id number] @ 6669w4d here in MAU with possible ROM. At 6 pm she was talking on the phone and felt a gush of fluid. The fluid was clear in color. Denies any bleeding. 15-30 minutes after her water broke she started having painful contractions. She then called 911. She currently rates her pain 7/10. The contractions come and go. Denies history of preterm labor or birth with previous pregnancies. + fetal movement.   OB History    Gravida Para Term Preterm AB Living   4 2 2   1 2    SAB TAB Ectopic Multiple Live Births   1       2      Past Medical History:  Diagnosis Date  . Miscarriage     Past Surgical History:  Procedure Laterality Date  . DILATATION & CURETTAGE/HYSTEROSCOPY WITH MYOSURE      No family history on file.  Social History  Substance Use Topics  . Smoking status: Former Smoker    Packs/day: 0.00  . Smokeless tobacco: Never Used  . Alcohol use No    Allergies: No Known Allergies  Prescriptions Prior to Admission  Medication Sig Dispense Refill Last Dose  . albuterol (PROVENTIL) (2.5 MG/3ML) 0.083% nebulizer solution Inhale 3 mLs into the lungs every 4 (four) hours as needed for wheezing or shortness of breath. 75 mL 12 Month at Unknown time  . docusate sodium (COLACE) 100 MG capsule Take 1 capsule (100 mg total) by mouth 2 (two) times daily as needed for mild constipation. 10 capsule 0 Past Month at Unknown time  . Prenatal Vit-Fe Fumarate-FA (PRENATAL MULTIVITAMIN) TABS tablet Take 1 tablet by mouth daily at 12 noon. 30 tablet 12 04/27/2017 at Unknown time  . promethazine (PHENERGAN) 25 MG tablet Take 1 tablet (25 mg total) by mouth every 6 (six) hours as needed for nausea or vomiting. 30 tablet 1  04/28/2017 at Unknown time  . metoCLOPramide (REGLAN) 10 MG tablet Take 1 tablet (10 mg total) by mouth 4 (four) times daily -  before meals and at bedtime. (Patient not taking: Reported on 03/20/2017) 30 tablet 3 Not Taking  . ondansetron (ZOFRAN-ODT) 4 MG disintegrating tablet Take 1 tablet (4 mg total) by mouth every 6 (six) hours as needed for nausea or vomiting. (Patient not taking: Reported on 01/24/2017) 20 tablet 0 Not Taking  . pantoprazole (PROTONIX) 40 MG tablet Take 1 tablet (40 mg total) by mouth daily. (Patient not taking: Reported on 04/11/2017) 30 tablet 3 Not Taking   Results for orders placed or performed during the hospital encounter of 04/28/17 (from the past 48 hour(s))  POCT fern test     Status: Normal   Collection Time: 04/28/17  8:00 PM  Result Value Ref Range   POCT Fern Test Negative = intact amniotic membranes   Fetal fibronectin     Status: None   Collection Time: 04/28/17  8:45 PM  Result Value Ref Range   Fetal Fibronectin NEGATIVE NEGATIVE  Urinalysis, Routine w reflex microscopic     Status: Abnormal   Collection Time: 04/28/17  9:10 PM  Result Value Ref Range   Color, Urine YELLOW YELLOW   APPearance HAZY (  A) CLEAR   Specific Gravity, Urine 1.019 1.005 - 1.030   pH 6.0 5.0 - 8.0   Glucose, UA NEGATIVE NEGATIVE mg/dL   Hgb urine dipstick NEGATIVE NEGATIVE   Bilirubin Urine NEGATIVE NEGATIVE   Ketones, ur 20 (A) NEGATIVE mg/dL   Protein, ur NEGATIVE NEGATIVE mg/dL   Nitrite NEGATIVE NEGATIVE   Leukocytes, UA LARGE (A) NEGATIVE   RBC / HPF 0-5 0 - 5 RBC/hpf   WBC, UA 6-30 0 - 5 WBC/hpf   Bacteria, UA RARE (A) NONE SEEN   Squamous Epithelial / LPF 6-30 (A) NONE SEEN   Mucous PRESENT   Amnisure rupture of membrane (rom)not at [facility]     Status: None   Collection Time: 04/28/17  9:30 PM  Result Value Ref Range   Amnisure ROM NEGATIVE     Review of Systems  Constitutional: Negative for fever.  Gastrointestinal: Positive for abdominal pain.   Physical  Exam   Blood pressure 131/70, pulse 97, temperature 97.6 F (36.4 C), temperature source Oral, resp. rate 18, height 5\' 6"  (1.676 m), weight 167 lb (75.8 kg), last menstrual period 07/30/2016, SpO2 100 %.  Physical Exam  Constitutional: She is oriented to person, place, and time. She appears well-developed and well-nourished. No distress.  HENT:  Head: Normocephalic.  Eyes: Pupils are equal, round, and reactive to light.  GI: Soft. She exhibits no distension. There is no tenderness. There is no rebound and no guarding.  Contractions palpated moderately with appropriate relaxation in between contractions   Genitourinary:  Genitourinary Comments: Dilation: Fingertip Effacement (%): Thick Cervical Position: Posterior Exam by:: J Mazy Culton RN  Musculoskeletal: Normal range of motion.  Neurological: She is alert and oriented to person, place, and time.  Skin: She is not diaphoretic.   Fetal Tracing: Baseline: 145 bpm Variability: moderate  Accelerations: 15x15 Decelerations: quick variables  Toco: difficult to trace; toco readjusted. UI, none following treatment.   MAU Course  Procedures  None  MDM  LR bolus X 1 Procardia X 3 doses Q 20 minutes apart.  Urine culture collected  Initial fern slide negative Negative pooling on exam Patient stood up to use the bathroom and called the nurse in the room because she felt fluid leaking down her leg. Noted clear fluid on the floor, Fern slide again collected from pooling. Fern again negative. Patient states "I think I am having a hard time holding my urine". Amnisure collected: negative  Fetal fibronectin collected: negative.  Patient feeling significantly better after fluids and PO procardia. Contractions absent on fetal tracing following treatment. Patient declines further pain medication when offered. States she would like to go home and take tylenol.  Assessment and Plan   A:  1. Encounter for suspected premature rupture of amniotic  membranes, with rupture of membranes not found   2. Threatened premature labor in third trimester   3. Braxton Hicks contractions     P:  Discharge home in stable condition Strict return precautions Follow up with OB as scheduled or sooner if needed Increase PO fluid intake Urine culture pending. Preterm labor precautions.   Venia Carbonasch, Hara Milholland I, NP 04/28/2017 10:20 PM

## 2017-04-28 NOTE — Discharge Instructions (Signed)

## 2017-04-30 LAB — CULTURE, OB URINE: Special Requests: NORMAL

## 2017-05-02 ENCOUNTER — Observation Stay
Admission: EM | Admit: 2017-05-02 | Discharge: 2017-05-06 | Disposition: A | Discharge status: Home / Self Care | Payer: Medicaid Other | Source: Ambulatory Visit | Attending: Obstetrics and Gynecology | Admitting: Obstetrics and Gynecology

## 2017-05-02 ENCOUNTER — Encounter: Payer: Medicaid Other | Admitting: Certified Nurse Midwife

## 2017-05-02 DIAGNOSIS — R197 Diarrhea, unspecified: Secondary | ICD-10-CM | POA: Insufficient documentation

## 2017-05-02 DIAGNOSIS — O26893 Other specified pregnancy related conditions, third trimester: Secondary | ICD-10-CM | POA: Insufficient documentation

## 2017-05-02 DIAGNOSIS — E878 Other disorders of electrolyte and fluid balance, not elsewhere classified: Secondary | ICD-10-CM | POA: Diagnosis present

## 2017-05-02 DIAGNOSIS — Z3490 Encounter for supervision of normal pregnancy, unspecified, unspecified trimester: Secondary | ICD-10-CM

## 2017-05-02 DIAGNOSIS — O99019 Anemia complicating pregnancy, unspecified trimester: Secondary | ICD-10-CM | POA: Diagnosis present

## 2017-05-02 DIAGNOSIS — O210 Mild hyperemesis gravidarum: Principal | ICD-10-CM | POA: Insufficient documentation

## 2017-05-02 DIAGNOSIS — E871 Hypo-osmolality and hyponatremia: Secondary | ICD-10-CM | POA: Insufficient documentation

## 2017-05-02 DIAGNOSIS — O99613 Diseases of the digestive system complicating pregnancy, third trimester: Secondary | ICD-10-CM | POA: Insufficient documentation

## 2017-05-02 DIAGNOSIS — Z3A31 31 weeks gestation of pregnancy: Secondary | ICD-10-CM | POA: Insufficient documentation

## 2017-05-02 DIAGNOSIS — E876 Hypokalemia: Secondary | ICD-10-CM | POA: Insufficient documentation

## 2017-05-02 DIAGNOSIS — O99013 Anemia complicating pregnancy, third trimester: Secondary | ICD-10-CM | POA: Insufficient documentation

## 2017-05-02 NOTE — OB Triage Note (Signed)
Pt arrived via EMS for c/o N/V worsening over the last 3 days.  Pt reports fetal movement, denies ctxns, leakage of fluid and vaginal bleeding.  EFM and toco applied.  Will monitor.

## 2017-05-03 DIAGNOSIS — Z3490 Encounter for supervision of normal pregnancy, unspecified, unspecified trimester: Secondary | ICD-10-CM

## 2017-05-03 DIAGNOSIS — E878 Other disorders of electrolyte and fluid balance, not elsewhere classified: Secondary | ICD-10-CM

## 2017-05-03 DIAGNOSIS — O99613 Diseases of the digestive system complicating pregnancy, third trimester: Secondary | ICD-10-CM | POA: Diagnosis not present

## 2017-05-03 DIAGNOSIS — O99013 Anemia complicating pregnancy, third trimester: Secondary | ICD-10-CM

## 2017-05-03 DIAGNOSIS — E876 Hypokalemia: Secondary | ICD-10-CM | POA: Diagnosis not present

## 2017-05-03 DIAGNOSIS — E871 Hypo-osmolality and hyponatremia: Secondary | ICD-10-CM | POA: Diagnosis not present

## 2017-05-03 DIAGNOSIS — R197 Diarrhea, unspecified: Secondary | ICD-10-CM | POA: Diagnosis not present

## 2017-05-03 DIAGNOSIS — Z3A31 31 weeks gestation of pregnancy: Secondary | ICD-10-CM | POA: Diagnosis not present

## 2017-05-03 DIAGNOSIS — O210 Mild hyperemesis gravidarum: Principal | ICD-10-CM

## 2017-05-03 DIAGNOSIS — R1110 Vomiting, unspecified: Secondary | ICD-10-CM | POA: Diagnosis present

## 2017-05-03 DIAGNOSIS — O99019 Anemia complicating pregnancy, unspecified trimester: Secondary | ICD-10-CM | POA: Diagnosis present

## 2017-05-03 DIAGNOSIS — O26893 Other specified pregnancy related conditions, third trimester: Secondary | ICD-10-CM | POA: Diagnosis not present

## 2017-05-03 LAB — URINALYSIS, COMPLETE (UACMP) WITH MICROSCOPIC
Bacteria, UA: NONE SEEN
Bilirubin Urine: NEGATIVE
GLUCOSE, UA: NEGATIVE mg/dL
Hgb urine dipstick: NEGATIVE
KETONES UR: 80 mg/dL — AB
Leukocytes, UA: NEGATIVE
NITRITE: NEGATIVE
PH: 6 (ref 5.0–8.0)
Protein, ur: 30 mg/dL — AB
Specific Gravity, Urine: 1.026 (ref 1.005–1.030)

## 2017-05-03 LAB — COMPREHENSIVE METABOLIC PANEL
ALT: 13 U/L — ABNORMAL LOW (ref 14–54)
ANION GAP: 5 (ref 5–15)
AST: 17 U/L (ref 15–41)
Albumin: 3.2 g/dL — ABNORMAL LOW (ref 3.5–5.0)
Alkaline Phosphatase: 48 U/L (ref 38–126)
BILIRUBIN TOTAL: 0.7 mg/dL (ref 0.3–1.2)
BUN: 7 mg/dL (ref 6–20)
CO2: 20 mmol/L — ABNORMAL LOW (ref 22–32)
Calcium: 8.4 mg/dL — ABNORMAL LOW (ref 8.9–10.3)
Chloride: 107 mmol/L (ref 101–111)
Creatinine, Ser: 0.41 mg/dL — ABNORMAL LOW (ref 0.44–1.00)
Glucose, Bld: 95 mg/dL (ref 65–99)
POTASSIUM: 3.3 mmol/L — AB (ref 3.5–5.1)
Sodium: 132 mmol/L — ABNORMAL LOW (ref 135–145)
TOTAL PROTEIN: 7 g/dL (ref 6.5–8.1)

## 2017-05-03 MED ORDER — DEXTROSE IN LACTATED RINGERS 5 % IV SOLN: INTRAVENOUS | Status: DC

## 2017-05-03 MED ORDER — ONDANSETRON 4 MG PO TBDP
4.0000 mg | ORAL_TABLET | Freq: Three times a day (TID) | ORAL | Status: DC
  Administered 2017-05-03 – 2017-05-06 (×9): 4 mg via ORAL
  Filled 2017-05-03 (×9): qty 1

## 2017-05-03 MED ORDER — CALCIUM CARBONATE ANTACID 500 MG PO CHEW
2.0000 | CHEWABLE_TABLET | ORAL | Status: DC | PRN
  Administered 2017-05-04: 400 mg via ORAL
  Filled 2017-05-03: qty 2

## 2017-05-03 MED ORDER — POTASSIUM CHLORIDE 10 MEQ/100ML IV SOLN
10.0000 meq | INTRAVENOUS | Status: AC
  Administered 2017-05-03: 10 meq via INTRAVENOUS
  Filled 2017-05-03 (×2): qty 100

## 2017-05-03 MED ORDER — DEXTROSE IN LACTATED RINGERS 5 % IV SOLN
INTRAVENOUS | Status: DC
  Administered 2017-05-03 (×2): via INTRAVENOUS

## 2017-05-03 MED ORDER — METOCLOPRAMIDE HCL 5 MG/ML IJ SOLN
10.0000 mg | INTRAMUSCULAR | Status: AC
  Administered 2017-05-03: 10 mg via INTRAVENOUS
  Filled 2017-05-03: qty 2

## 2017-05-03 MED ORDER — ZOLPIDEM TARTRATE 5 MG PO TABS
5.0000 mg | ORAL_TABLET | Freq: Every evening | ORAL | Status: DC | PRN
  Administered 2017-05-03 – 2017-05-06 (×3): 5 mg via ORAL
  Filled 2017-05-03 (×3): qty 1

## 2017-05-03 MED ORDER — BISMUTH SUBSALICYLATE 262 MG/15ML PO SUSP: 30.0000 mL | ORAL | Status: DC | PRN

## 2017-05-03 MED ORDER — METOCLOPRAMIDE HCL 5 MG/ML IJ SOLN
10.0000 mg | Freq: Four times a day (QID) | INTRAMUSCULAR | Status: DC
  Administered 2017-05-03 – 2017-05-06 (×11): 10 mg via INTRAVENOUS
  Filled 2017-05-03 (×11): qty 2

## 2017-05-03 MED ORDER — ONDANSETRON 4 MG PO TBDP
4.0000 mg | ORAL_TABLET | Freq: Once | ORAL | Status: AC
  Administered 2017-05-03: 4 mg via ORAL
  Filled 2017-05-03: qty 1

## 2017-05-03 MED ORDER — LOPERAMIDE HCL 2 MG PO CAPS
2.0000 mg | ORAL_CAPSULE | ORAL | Status: DC | PRN
  Administered 2017-05-03: 2 mg via ORAL
  Filled 2017-05-03: qty 1

## 2017-05-03 MED ORDER — DEXTROSE-NACL 5-0.9 % IV SOLN
INTRAVENOUS | Status: DC
  Administered 2017-05-03 – 2017-05-05 (×7): via INTRAVENOUS

## 2017-05-03 MED ORDER — PRENATAL MULTIVITAMIN CH
1.0000 | ORAL_TABLET | Freq: Every day | ORAL | Status: DC
  Administered 2017-05-04 – 2017-05-06 (×3): 1 via ORAL
  Filled 2017-05-03 (×3): qty 1

## 2017-05-03 MED ORDER — DOCUSATE SODIUM 100 MG PO CAPS
100.0000 mg | ORAL_CAPSULE | Freq: Every day | ORAL | Status: DC
  Filled 2017-05-03 (×2): qty 1

## 2017-05-03 MED ORDER — FAMOTIDINE IN NACL 20-0.9 MG/50ML-% IV SOLN
20.0000 mg | Freq: Two times a day (BID) | INTRAVENOUS | Status: DC
  Administered 2017-05-03 – 2017-05-06 (×6): 20 mg via INTRAVENOUS
  Filled 2017-05-03 (×7): qty 50

## 2017-05-03 MED ORDER — ACETAMINOPHEN 325 MG PO TABS
650.0000 mg | ORAL_TABLET | ORAL | Status: DC | PRN
  Administered 2017-05-03: 650 mg via ORAL
  Filled 2017-05-03: qty 2

## 2017-05-03 MED ORDER — SCOPOLAMINE 1 MG/3DAYS TD PT72
1.0000 | MEDICATED_PATCH | TRANSDERMAL | Status: DC
  Administered 2017-05-03: 1.5 mg via TRANSDERMAL
  Filled 2017-05-03: qty 1

## 2017-05-03 NOTE — H&P (Addendum)
L&D OB Triage Note  HPI:  Natalie Riggs is a 26 y.o. [id number] female at [redacted]w[redacted]d. Estimated Date of Delivery: 07/03/17 who presents for complaints of nausea, vomiting and diarrhea x 4 days. Last thing she was able to keep down was a BBQ sand which ~ 4 days ago. States that she was seen in triage at [facility] ~ 3 days ago for feeling nauseated and "not quite right". Did not have much intake that day.   Notes that they gave her IVF and Zofran, and discharged her.  Since then, she states that the vomiting began, and also began to experience diarrhea. She has not been able to keep anything down since that time.  Patient states that she has a h/o hyperemesis since [redacted] weeks gestation requiring admission, however it improved later into the second trimester. She currently denies fevers, chills, lower abdominal pain.  She does note a sensation of "burning" in her chest.  Has tried to take Phenergan at home but cannot keep it down. Denies contractions, LOF, vaginal bleeding    OB History  Gravida Para Term Preterm AB Living  4 2 2   1 2   SAB TAB Ectopic Multiple Live Births  1       2    # Outcome Date GA Lbr Len/2nd Weight Sex Delivery Anes PTL Lv  4 Current           3 SAB 12/02/14          2 Term 10/20/10     Vag-Spont   LIV  1 Term 05/06/09     Vag-Spont   LIV      Patient Active Problem List   Diagnosis Date Noted  . Pregnancy 05/03/2017  . GBS bacteriuria 01/24/2017  . Supervision of other normal pregnancy, antepartum 01/24/2017  . Hyperemesis affecting pregnancy, antepartum 12/25/2016  . Acute upper respiratory infection 12/25/2016    Past Medical History:  Diagnosis Date  . Miscarriage     No current facility-administered medications on file prior to encounter.    Current Outpatient Prescriptions on File Prior to Encounter  Medication Sig Dispense Refill  . Prenatal Vit-Fe Fumarate-FA (PRENATAL MULTIVITAMIN) TABS tablet Take 1 tablet by mouth daily at 12 noon. 30 tablet 12  .  promethazine (PHENERGAN) 25 MG tablet Take 1 tablet (25 mg total) by mouth every 6 (six) hours as needed for nausea or vomiting. 30 tablet 1  . albuterol (PROVENTIL) (2.5 MG/3ML) 0.083% nebulizer solution Inhale 3 mLs into the lungs every 4 (four) hours as needed for wheezing or shortness of breath. (Patient not taking: Reported on 05/02/2017) 75 mL 12  . docusate sodium (COLACE) 100 MG capsule Take 1 capsule (100 mg total) by mouth 2 (two) times daily as needed for mild constipation. (Patient not taking: Reported on 05/02/2017) 10 capsule 0    No Known Allergies   ROS:  General:Not Present- Fever, Weight Loss and Weight Gain. Skin:Not Present- Rash. HEENT:Not Present- Blurred Vision, Headache and Bleeding Gums. Respiratory:Not Present- Difficulty Breathing. Breast:Not Present- Breast Mass. Cardiovascular:Not Present- Chest Pain, Elevated Blood Pressure, Fainting / Blacking Out and Shortness of Breath. Gastrointestinal:Present - Nausea and Vomiting (as noted in HPI). Not Present- Abdominal Pain, Constipation,  Female Genitourinary:Not Present- Frequency, Painful Urination, Pelvic Pain, Vaginal Bleeding, Vaginal Discharge, Contractions, regular, Fetal Movements Decreased, Urinary Complaints and Vaginal Fluid. Musculoskeletal:Not Present- Back Pain and Leg Cramps. Neurological:Not Present- Dizziness. Psychiatric:Not Present- Depression.    Physical Exam:  Blood pressure 122/63, pulse 90, temperature  98.7 F (37.1 C), temperature source Oral, resp. rate 18, height 5\' 7"  (1.702 m), weight 161 lb (73 kg), last menstrual period 07/30/2016. General appearance: alert and mild distress  Head: Normocephalic, without obvious abnormality, atraumatic Eyes: negative findings: conjunctivae and sclerae normal and pupils equal, round, reactive to light and accomodation Nose: Nares normal. Septum midline. Mucosa normal. No drainage or sinus tenderness. Throat: lips and mucosa with mild pallor and  dryness.  Tongue normal; teeth and gums normal Neck: no adenopathy, no carotid bruit, no JVD, supple, symmetrical, trachea midline and thyroid not enlarged, symmetric, no tenderness/mass/nodules Back: symmetric, no curvature. ROM normal. No CVA tenderness. Lungs: clear to auscultation bilaterally Heart: regular rate and rhythm, S1, S2 normal, no murmur, click, rub or gallop Abdomen: normal findings: no masses palpable and soft, gravid and abnormal findings:  mild tenderness in the lower abdomen Pelvic: deferred Extremities: extremities normal, atraumatic, no cyanosis or edema   NST INTERPRETATION: Indications: rule out uterine contractions  Mode: External Baseline Rate (A): 145 bpm Variability: Moderate Accelerations: 10 x 10 Decelerations: Variable     Contraction Frequency (min): none  Impression: reactive   Labs:  Perinatal Labs:   Overview Addendum 04/11/2017 9:06 AM by Duane Lopeasch, Jennifer I, NP  Clinic  [facility] Prenatal Labs  Dating  LMP Blood type: --/--/B POS, B POS (03/25 2004)   Genetic Screen 1 Screen: too late   Quad: negative  Antibody:NEG (03/25 2004)  Anatomic US  normal antomy Rubella:  imm  GTT Early:               Third trimester:  RPR:   neg  Flu vaccine Declined HBsAg:   neg  TDaP vaccine 04/11/2017                                           Rhogam: NA HIV:   neg  Contraception Depo Pap: 09/05/16 @ [facility] -- records requested  Baby Food  bottle   Circumcision  n/a, female   Pediatrician  [place]. [place] Hemoglobinopathy normal  Support Person  Kim (FOB)        Lab Results  Component Value Date   WBC 7.9 04/11/2017   HGB 10.6 (L) 04/11/2017   HCT 31.9 (L) 04/11/2017   MCV 89 04/11/2017   PLT 193 04/11/2017     Results for orders placed or performed during the hospital encounter of 05/02/17  Comprehensive metabolic panel  Result Value Ref Range   Sodium 132 (L) 135 - 145 mmol/L   Potassium 3.3 (L) 3.5 - 5.1 mmol/L   Chloride 107  101 - 111 mmol/L   CO2 20 (L) 22 - 32 mmol/L   Glucose, Bld 95 65 - 99 mg/dL   BUN 7 6 - 20 mg/dL   Creatinine, Ser 1.610.41 (L) 0.44 - 1.00 mg/dL   Calcium 8.4 (L) 8.9 - 10.3 mg/dL   Total Protein 7.0 6.5 - 8.1 g/dL   Albumin 3.2 (L) 3.5 - 5.0 g/dL   AST 17 15 - 41 U/L   ALT 13 (L) 14 - 54 U/L   Alkaline Phosphatase 48 38 - 126 U/L   Total Bilirubin 0.7 0.3 - 1.2 mg/dL   GFR calc non Af Amer >60 >60 mL/min   GFR calc Af Amer >60 >60 mL/min   Anion gap 5 5 - 15  Urinalysis, Complete w Microscopic  Result Value Ref Range   Color, Urine YELLOW (A) YELLOW   APPearance HAZY (A) CLEAR   Specific Gravity, Urine 1.026 1.005 - 1.030   pH 6.0 5.0 - 8.0   Glucose, UA NEGATIVE NEGATIVE mg/dL   Hgb urine dipstick NEGATIVE NEGATIVE   Bilirubin Urine NEGATIVE NEGATIVE   Ketones, ur 80 (A) NEGATIVE mg/dL   Protein, ur 30 (A) NEGATIVE mg/dL   Nitrite NEGATIVE NEGATIVE   Leukocytes, UA NEGATIVE NEGATIVE   RBC / HPF 0-5 0 - 5 RBC/hpf   WBC, UA 6-30 0 - 5 WBC/hpf   Bacteria, UA NONE SEEN NONE SEEN   Squamous Epithelial / LPF 0-5 (A) NONE SEEN   Mucous PRESENT     Assessment:  26 y.o. [id number] at 2752w2d with:  1.  Hyperemesis 2. Diarrhea 3. Mild electrolyte changes 4. H/o Abnormal Urinalysis 5. Mild anemia of pregnancy  Plan:  1. Patient given Zofran in triage with initial improvement, however failed PO challenge.  Was then given Reglan.  Patient still noted emesis (of mostly clear sputum) soon after dose, but nothing since then (has been ~ 3 hrs).  Given D5LR bolus, but will continue with D5NS continuous due to mild hyponatremia.  Will continue with alternating Zofran and Reglan. Will admit for observation.  2. Diarrhea, none since being in triage.  Was given Immidium x 1 dose.  3. Electrolyte changes with mild hyponatremia and mild hypokalemia.  Will replace accordingly.   4. H/o abnormal UA several days ago, with indeterminate urine culture suggesting recollection.  Patient with UA today wnl  except ketonuria. Will continue with IVF.  Urine culture ordered to r/o occult UTI due to prior indeterminate urine culture. 5. Mild anemia being managed with PO iron supplementation during pregnancy, will resume once patient better able to tolerate PO.  6. Fetal status reassuring.  Can discontinue continuous monitoring.  Continue with q shift dopplers.   Hildred Laserherry, Tayshun Gappa, MD [facility]

## 2017-05-03 NOTE — Progress Notes (Signed)
  Antenatal Progress Note  Subjective:     Patient ID: Natalie Riggs is a 26 y.o. female 2565w2d, Estimated Date of Delivery: 07/03/17 by last menstrual period of 07/30/2016 (exact date)  who was admitted for hyperemesis.  HD# 1.   Subjective:  Patient notes that she is starting to feel a little better.  Has had no further episodes of emesis since this morning.     Review of Systems Denies contractions, leakage of fluids, vaginal bleeding, and reports good fetal movement.     Objective:   Vitals:   05/03/17 1101 05/03/17 1249 05/03/17 1637 05/03/17 1926  BP: 113/62 (!) 108/95 112/68 (!) 114/53  Pulse: 90 88 83 81  Resp: 14 18 18 20   Temp: 99 F (37.2 C) 97.9 F (36.6 C) 97.8 F (36.6 C) 97.6 F (36.4 C)  TempSrc: Oral Oral Oral Oral  SpO2:  100%    Weight:      Height:        General appearance: alert and no distress Lungs: clear to auscultation bilaterally Heart: regular rate and rhythm, S1, S2 normal, no murmur, click, rub or gallop Abdomen: soft, non-tender; bowel sounds normal; no masses,  no organomegaly.  Gravid.  Pelvic: deferred Extremities: extremities normal, atraumatic, no cyanosis or edema   Assessment:  26 y.o. [id number] female at 6265w2d with:   1. Hyperemesis 2. Mild electrolyte changes 3. Diarrhea 4. H/o abnormal urinalysis  Plan:  1. Continue Zofran and Reglan IV.  Once tolerating PO, can change to PO medications. Will start patient on clears during evening shift. If tolerating, will advance diet as tolerated.  2. Mild electrolyte changes.  Patient currently receiving IV NS to increase sodium levels and potassium runs to increase levels. Will recheck CMP in am.  3. Diarrhea has resolved, no further episodes since admission 4. H/o abnormal urinalysis several days ago with inconclusive culture. Pending repeat.  5. Assess FHT q shift.     Hildred Laserherry, Veer Elamin, MD [facility]

## 2017-05-04 DIAGNOSIS — O210 Mild hyperemesis gravidarum: Secondary | ICD-10-CM | POA: Diagnosis not present

## 2017-05-04 LAB — COMPREHENSIVE METABOLIC PANEL
ALT: 12 U/L — ABNORMAL LOW (ref 14–54)
AST: 20 U/L (ref 15–41)
Albumin: 2.7 g/dL — ABNORMAL LOW (ref 3.5–5.0)
Alkaline Phosphatase: 55 U/L (ref 38–126)
Anion gap: 3 — ABNORMAL LOW (ref 5–15)
BILIRUBIN TOTAL: 0.6 mg/dL (ref 0.3–1.2)
BUN: 6 mg/dL (ref 6–20)
CO2: 21 mmol/L — ABNORMAL LOW (ref 22–32)
CREATININE: 0.55 mg/dL (ref 0.44–1.00)
Calcium: 7.9 mg/dL — ABNORMAL LOW (ref 8.9–10.3)
Chloride: 110 mmol/L (ref 101–111)
GFR calc Af Amer: >60 mL/min (ref >60)
GFR calc non Af Amer: >60 mL/min (ref >60)
GLUCOSE: 94 mg/dL (ref 65–99)
POTASSIUM: 3.2 mmol/L — AB (ref 3.5–5.1)
Sodium: 134 mmol/L — ABNORMAL LOW (ref 135–145)
TOTAL PROTEIN: 5.8 g/dL — AB (ref 6.5–8.1)

## 2017-05-04 LAB — URINE CULTURE: SPECIAL REQUESTS: NORMAL

## 2017-05-04 MED ORDER — GI COCKTAIL ~~LOC~~
30.0000 mL | Freq: Once | ORAL | Status: AC
Start: 2017-05-04 — End: 2017-05-04
  Administered 2017-05-04: 30 mL via ORAL
  Filled 2017-05-04: qty 30

## 2017-05-04 MED ORDER — POTASSIUM CHLORIDE CRYS ER 20 MEQ PO TBCR
40.0000 meq | EXTENDED_RELEASE_TABLET | Freq: Every day | ORAL | Status: AC
  Administered 2017-05-04 – 2017-05-05 (×2): 40 meq via ORAL
  Filled 2017-05-04 (×2): qty 2

## 2017-05-04 NOTE — OB Triage Note (Signed)
Scheduled NST 

## 2017-05-04 NOTE — Progress Notes (Signed)
Antenatal Progress Note  Subjective:     Patient ID: Natalie Riggs is a 26 y.o. [id number] female 857w3d, Estimated Date of Delivery: 07/03/17 by last menstrual period of 07/30/2016 (exact date)  who was admitted for hyperemesis.  HD# 2.   Subjective:  Patient feeling ok, but notes that she was not able to keep down many clears overnight. States that her stomach feels very raw and irritated, and that this may be the reason it came back up.  Denies any nausea currently.     Review of Systems Denies contractions, leakage of fluids, vaginal bleeding, and reports good fetal movement.     Objective:   Vitals:   05/03/17 1249 05/03/17 1637 05/03/17 1926 05/04/17 0100  BP: (!) 108/95 112/68 (!) 114/53 111/61  Pulse: 88 83 81 87  Resp: 18 18 20 20   Temp: 97.9 F (36.6 C) 97.8 F (36.6 C) 97.6 F (36.4 C) 98.2 F (36.8 C)  TempSrc: Oral Oral Oral Oral  SpO2: 100%   100%  Weight:      Height:        General appearance: alert and no distress Lungs: clear to auscultation bilaterally Heart: regular rate and rhythm, S1, S2 normal, no murmur, click, rub or gallop Abdomen: soft, non-tender; bowel sounds normal; no masses,  no organomegaly.  Gravid.  Pelvic: deferred Extremities: extremities normal, atraumatic, no cyanosis or edema  FHT: present.    Labs:  Results for orders placed or performed during the hospital encounter of 05/02/17  Comprehensive metabolic panel  Result Value Ref Range   Sodium 132 (L) 135 - 145 mmol/L   Potassium 3.3 (L) 3.5 - 5.1 mmol/L   Chloride 107 101 - 111 mmol/L   CO2 20 (L) 22 - 32 mmol/L   Glucose, Bld 95 65 - 99 mg/dL   BUN 7 6 - 20 mg/dL   Creatinine, Ser 4.540.41 (L) 0.44 - 1.00 mg/dL   Calcium 8.4 (L) 8.9 - 10.3 mg/dL   Total Protein 7.0 6.5 - 8.1 g/dL   Albumin 3.2 (L) 3.5 - 5.0 g/dL   AST 17 15 - 41 U/L   ALT 13 (L) 14 - 54 U/L   Alkaline Phosphatase 48 38 - 126 U/L   Total Bilirubin 0.7 0.3 - 1.2 mg/dL   GFR calc non Af Amer >60 >60 mL/min   GFR calc Af Amer >60 >60 mL/min   Anion gap 5 5 - 15  Urinalysis, Complete w Microscopic  Result Value Ref Range   Color, Urine YELLOW (A) YELLOW   APPearance HAZY (A) CLEAR   Specific Gravity, Urine 1.026 1.005 - 1.030   pH 6.0 5.0 - 8.0   Glucose, UA NEGATIVE NEGATIVE mg/dL   Hgb urine dipstick NEGATIVE NEGATIVE   Bilirubin Urine NEGATIVE NEGATIVE   Ketones, ur 80 (A) NEGATIVE mg/dL   Protein, ur 30 (A) NEGATIVE mg/dL   Nitrite NEGATIVE NEGATIVE   Leukocytes, UA NEGATIVE NEGATIVE   RBC / HPF 0-5 0 - 5 RBC/hpf   WBC, UA 6-30 0 - 5 WBC/hpf   Bacteria, UA NONE SEEN NONE SEEN   Squamous Epithelial / LPF 0-5 (A) NONE SEEN   Mucous PRESENT   Comprehensive metabolic panel  Result Value Ref Range   Sodium 134 (L) 135 - 145 mmol/L   Potassium 3.2 (L) 3.5 - 5.1 mmol/L   Chloride 110 101 - 111 mmol/L   CO2 21 (L) 22 - 32 mmol/L   Glucose, Bld 94 65 - 99 mg/dL  BUN 6 6 - 20 mg/dL   Creatinine, Ser 4.090.55 0.44 - 1.00 mg/dL   Calcium 7.9 (L) 8.9 - 10.3 mg/dL   Total Protein 5.8 (L) 6.5 - 8.1 g/dL   Albumin 2.7 (L) 3.5 - 5.0 g/dL   AST 20 15 - 41 U/L   ALT 12 (L) 14 - 54 U/L   Alkaline Phosphatase 55 38 - 126 U/L   Total Bilirubin 0.6 0.3 - 1.2 mg/dL   GFR calc non Af Amer >60 >60 mL/min   GFR calc Af Amer >60 >60 mL/min   Anion gap 3 (L) 5 - 15    Assessment:  [age] y.o. [id number] female at 7461w2d with:   1. Hyperemesis 2. Mild electrolyte changes 3. Diarrhea 4. H/o abnormal urinalysis  Plan:  1. Continue Zofran and Reglan IV.  Once tolerating PO, can change to PO medications. Will leave on clears for now.  Currently receiving Pepcid q 12 hrs, however will give a GI cocktail to help sooth likely inflammation of the upper GI tract due to persistent vomiting.  Continue scopolamine patch for retching and dry heaving.  Patient notes that this has helped.  2. Mild electrolyte changes.  Patient currently receiving IV NS to increase sodium levels and received 2 runs of K+.  Still deficient.   As patient is able to tolerate small amount of PO, will change to PO potassium tablets.  3. Diarrhea has resolved, no further episodes since admission 4. H/o abnormal urinalysis several days ago with inconclusive culture. Pending repeat culture.  5. Can assess FHT daily.     Natalie Riggs, Natalie Walthour, MD [facility]

## 2017-05-05 DIAGNOSIS — O210 Mild hyperemesis gravidarum: Secondary | ICD-10-CM | POA: Diagnosis not present

## 2017-05-05 LAB — COMPREHENSIVE METABOLIC PANEL
ALBUMIN: 2.6 g/dL — AB (ref 3.5–5.0)
ALT: 12 U/L — AB (ref 14–54)
AST: 17 U/L (ref 15–41)
Alkaline Phosphatase: 55 U/L (ref 38–126)
Anion gap: 2 — ABNORMAL LOW (ref 5–15)
BUN: <5 mg/dL — ABNORMAL LOW (ref 6–20)
CHLORIDE: 113 mmol/L — AB (ref 101–111)
CO2: 21 mmol/L — AB (ref 22–32)
CREATININE: 0.5 mg/dL (ref 0.44–1.00)
Calcium: 7.9 mg/dL — ABNORMAL LOW (ref 8.9–10.3)
GFR calc non Af Amer: >60 mL/min (ref >60)
GLUCOSE: 79 mg/dL (ref 65–99)
Potassium: 3.3 mmol/L — ABNORMAL LOW (ref 3.5–5.1)
SODIUM: 136 mmol/L (ref 135–145)
Total Bilirubin: 0.5 mg/dL (ref 0.3–1.2)
Total Protein: 5.6 g/dL — ABNORMAL LOW (ref 6.5–8.1)

## 2017-05-05 NOTE — Progress Notes (Signed)
L&D notified at approximately 2000 (left message with Mitzi DavenportShelby to notifiy RN and call back with anticipated time), 2300 (spoke with Sharman CrateMaura and she gave a possible time of around 1 am), and 0100 (Arvetta stated she would get the pt after she brought her delivery out and it would "be about another hour"). Her pt was brought out at 0315. NST still not done at this time after multiple attempts.

## 2017-05-05 NOTE — Progress Notes (Signed)
Patient ID: Natalie Riggs, female   DOB: Apr 28, 1991, 26 y.o.   MRN: [id number]    Subjective:    Tolerated liquids yesterday and today without vomiting. Feeling hungry today and would like to try eating.  Objective:    No data found.  No intake/output data recorded.  Labs: Results for orders placed or performed during the hospital encounter of 05/02/17 (from the past 24 hour(s))  Comprehensive metabolic panel     Status: Abnormal   Collection Time: 05/05/17  4:04 AM  Result Value Ref Range   Sodium 136 135 - 145 mmol/L   Potassium 3.3 (L) 3.5 - 5.1 mmol/L   Chloride 113 (H) 101 - 111 mmol/L   CO2 21 (L) 22 - 32 mmol/L   Glucose, Bld 79 65 - 99 mg/dL   BUN <5 (L) 6 - 20 mg/dL   Creatinine, Ser 4.090.50 0.44 - 1.00 mg/dL   Calcium 7.9 (L) 8.9 - 10.3 mg/dL   Total Protein 5.6 (L) 6.5 - 8.1 g/dL   Albumin 2.6 (L) 3.5 - 5.0 g/dL   AST 17 15 - 41 U/L   ALT 12 (L) 14 - 54 U/L   Alkaline Phosphatase 55 38 - 126 U/L   Total Bilirubin 0.5 0.3 - 1.2 mg/dL   GFR calc non Af Amer >60 >60 mL/min   GFR calc Af Amer >60 >60 mL/min   Anion gap 2 (L) 5 - 15    Medications    Current Discharge Medication List    CONTINUE these medications which have NOT CHANGED   Details  Prenatal Vit-Fe Fumarate-FA (PRENATAL MULTIVITAMIN) TABS tablet Take 1 tablet by mouth daily at 12 noon. Qty: 30 tablet, Refills: 12    promethazine (PHENERGAN) 25 MG tablet Take 1 tablet (25 mg total) by mouth every 6 (six) hours as needed for nausea or vomiting. Qty: 30 tablet, Refills: 1    albuterol (PROVENTIL) (2.5 MG/3ML) 0.083% nebulizer solution Inhale 3 mLs into the lungs every 4 (four) hours as needed for wheezing or shortness of breath. Qty: 75 mL, Refills: 12    docusate sodium (COLACE) 100 MG capsule Take 1 capsule (100 mg total) by mouth 2 (two) times daily as needed for mild constipation. Qty: 10 capsule, Refills: 0          Assessment:    Hyperemesis gravidarum-seems to be improving patient hungry  today.  Plan:    Slowly increased diet. If patient tolerates food consider discharge tomorrow.  Continue fetal surveillance.  Elonda Huskyavid J. Manuel Lawhead, M.D. 05/05/2017 10:35 AM  NONSTRESS TEST INTERPRETATION  From 05-04-17  INDICATIONS: Hyperemesis  RESULTS:  reactive COMMENTS:   PLAN: 1. Continue fetal kick counts as directed. 2. Continue antepartum testing as scheduled.  Elonda Huskyavid J. Kensi Karr, M.D. 05/05/2017 10:37 AM

## 2017-05-06 DIAGNOSIS — O210 Mild hyperemesis gravidarum: Secondary | ICD-10-CM | POA: Diagnosis not present

## 2017-05-06 MED ORDER — METOCLOPRAMIDE HCL 10 MG PO TABS: 10.0000 mg | ORAL_TABLET | Freq: Three times a day (TID) | ORAL | 0 refills | Status: DC

## 2017-05-06 NOTE — Discharge Summary (Signed)
Physician Discharge Summary    Patient ID: Natalie Riggs MRN: [id number] DOB/AGE: [birth year] 26 y.o.  Admit date: 05/02/2017 Discharge date: 05/06/2017  Admission Diagnoses:  Discharge Diagnoses:  Active Problems:   Hyperemesis affecting pregnancy, antepartum   Pregnancy   Electrolyte disturbance   Anemia of pregnancy   Discharged Condition: good  Hospital Course: Presented with N/V/D for 4 days.  Could not take any PO including liquids.  Pt was given antiemetics, IV fluids and electrolyes and over the nexte 4 days slowly began to take liquids and solid foods.  At the time of discharge she was eating and tolerating a full diet. Her baby had reactive NSTs daily.  Consults: None  Discharge Exam: Blood pressure (!) 111/56, pulse 82, temperature 98.4 F (36.9 C), temperature source Oral, resp. rate 18, height 5\' 7"  (1.702 m), weight 161 lb (73 kg), last menstrual period 07/30/2016, SpO2 98 %.   Disposition: 01-Home or Self Care   Allergies as of 05/06/2017   No Known Allergies     Medication List    STOP taking these medications   docusate sodium 100 MG capsule Commonly known as:  COLACE   promethazine 25 MG tablet Commonly known as:  PHENERGAN     TAKE these medications   albuterol (2.5 MG/3ML) 0.083% nebulizer solution Commonly known as:  PROVENTIL Inhale 3 mLs into the lungs every 4 (four) hours as needed for wheezing or shortness of breath.   metoCLOPramide 10 MG tablet Commonly known as:  REGLAN Take 1 tablet (10 mg total) by mouth 3 (three) times daily before meals.   prenatal multivitamin Tabs tablet Take 1 tablet by mouth daily at 12 noon.      Follow-up Information    Linzie CollinEvans, Robertt Buda James, MD Follow up in 1 week(s).   Specialty:  Obstetrics and Gynecology Contact information: [address] [place] [id number] [phone]           Signed: Brennan BaileyDavid Arpi Diebold 05/06/2017, 9:19 AM

## 2017-05-06 NOTE — Progress Notes (Signed)
Patient discharged home with family. Discharge instructions, prescriptions and follow up appointment given to and reviewed with patient. Patient verbalized understanding. Escorted out by nursing staff.

## 2017-05-08 ENCOUNTER — Ambulatory Visit (INDEPENDENT_AMBULATORY_CARE_PROVIDER_SITE_OTHER): Payer: Medicaid Other | Admitting: Student

## 2017-05-08 DIAGNOSIS — Z3483 Encounter for supervision of other normal pregnancy, third trimester: Secondary | ICD-10-CM

## 2017-05-08 DIAGNOSIS — Z3480 Encounter for supervision of other normal pregnancy, unspecified trimester: Secondary | ICD-10-CM

## 2017-05-08 NOTE — Progress Notes (Signed)
   PRENATAL VISIT NOTE  Subjective:  Natalie Riggs is a 26 y.o. [phone] at 3837w0d being seen today for ongoing prenatal care.  She is currently monitored for the following issues for this low-risk pregnancy and has Hyperemesis affecting pregnancy, antepartum; Acute upper respiratory infection; GBS bacteriuria; Supervision of other normal pregnancy, antepartum; Pregnancy; Electrolyte disturbance; and Anemia of pregnancy on her problem list.  Patient reports no ob-gyn complaints however patient is very upset because she wants an US. She feels like her US was "too fast" at 19 weeks and she thinks that they did not do a good job. .  Contractions: Not present. Vag. Bleeding: None.  Movement: Present. Denies leaking of fluid.   The following portions of the patient's history were reviewed and updated as appropriate: allergies, current medications, past family history, past medical history, past social history, past surgical history and problem list. Problem list updated.  Objective:   Vitals:   05/08/17 1321  BP: 126/67  Pulse: 93  Weight: 164 lb 8 oz (74.6 kg)    Fetal Status: Fetal Heart Rate (bpm): 155 Fundal Height: 32 cm Movement: Present     General:  Alert, oriented and cooperative. Patient is in no acute distress.  Skin: Skin is warm and dry. No rash noted.   Cardiovascular: Normal heart rate noted  Respiratory: Normal respiratory effort, no problems with respiration noted  Abdomen: Soft, gravid, appropriate for gestational age.  Pain/Pressure: Absent     Pelvic: Cervical exam deferred        Extremities: Normal range of motion.  Edema: None  Mental Status:  Normal mood and affect. Normal behavior. Normal judgment and thought content.   Assessment and Plan:  Pregnancy: [id number] at 7937w0d  1. Supervision of other normal pregnancy, antepartum I explained to patient that there is no indication for an US at this point. I reviewed the US findings with her, including her due date. I  reassured her that if anything were to change, we would definitely do an US. Patient understands, and is ok with the plan of care.   Preterm labor symptoms and general obstetric precautions including but not limited to vaginal bleeding, contractions, leaking of fluid and fetal movement were reviewed in detail with the patient. Please refer to After Visit Summary for other counseling recommendations.  Return in about 2 weeks (around 05/22/2017).   Marylene LandKathryn Lorraine Kooistra, CNM

## 2017-05-08 NOTE — Patient Instructions (Signed)

## 2017-05-22 ENCOUNTER — Ambulatory Visit (INDEPENDENT_AMBULATORY_CARE_PROVIDER_SITE_OTHER): Payer: Medicaid Other | Admitting: Medical

## 2017-05-22 VITALS — BP 116/68 | HR 99 | Wt 161.6 lb

## 2017-05-22 DIAGNOSIS — Z3480 Encounter for supervision of other normal pregnancy, unspecified trimester: Secondary | ICD-10-CM

## 2017-05-22 DIAGNOSIS — Z3483 Encounter for supervision of other normal pregnancy, third trimester: Secondary | ICD-10-CM

## 2017-05-22 NOTE — Patient Instructions (Signed)
Fetal Movement Counts °Patient Name: ________________________________________________ Patient Due Date: ____________________ °What is a fetal movement count? °A fetal movement count is the number of times that you feel your baby move during a certain amount of time. This may also be called a fetal kick count. A fetal movement count is recommended for every pregnant woman. You may be asked to start counting fetal movements as early as week 28 of your pregnancy. °Pay attention to when your baby is most active. You may notice your baby's sleep and wake cycles. You may also notice things that make your baby move more. You should do a fetal movement count: °· When your baby is normally most active. °· At the same time each day. ° °A good time to count movements is while you are resting, after having something to eat and drink. °How do I count fetal movements? °1. Find a quiet, comfortable area. Sit, or lie down on your side. °2. Write down the date, the start time and stop time, and the number of movements that you felt between those two times. Take this information with you to your health care visits. °3. For 2 hours, count kicks, flutters, swishes, rolls, and jabs. You should feel at least 10 movements during 2 hours. °4. You may stop counting after you have felt 10 movements. °5. If you do not feel 10 movements in 2 hours, have something to eat and drink. Then, keep resting and counting for 1 hour. If you feel at least 4 movements during that hour, you may stop counting. °Contact a health care provider if: °· You feel fewer than 4 movements in 2 hours. °· Your baby is not moving like he or she usually does. °Date: ____________ Start time: ____________ Stop time: ____________ Movements: ____________ °Date: ____________ Start time: ____________ Stop time: ____________ Movements: ____________ °Date: ____________ Start time: ____________ Stop time: ____________ Movements: ____________ °Date: ____________ Start time:  ____________ Stop time: ____________ Movements: ____________ °Date: ____________ Start time: ____________ Stop time: ____________ Movements: ____________ °Date: ____________ Start time: ____________ Stop time: ____________ Movements: ____________ °Date: ____________ Start time: ____________ Stop time: ____________ Movements: ____________ °Date: ____________ Start time: ____________ Stop time: ____________ Movements: ____________ °Date: ____________ Start time: ____________ Stop time: ____________ Movements: ____________ °This information is not intended to replace advice given to you by your health care provider. Make sure you discuss any questions you have with your health care provider. °Document Released: 10/19/2006 Document Revised: 05/18/2016 Document Reviewed: 10/29/2015 °Elsevier Interactive Patient Education © 2018 [employer] °Braxton Hicks Contractions °Contractions of the uterus can occur throughout pregnancy, but they are not always a sign that you are in labor. You may have practice contractions called Braxton Hicks contractions. These false labor contractions are sometimes confused with true labor. °What are Braxton Hicks contractions? °Braxton Hicks contractions are tightening movements that occur in the muscles of the uterus before labor. Unlike true labor contractions, these contractions do not result in opening (dilation) and thinning of the cervix. Toward the end of pregnancy (32-34 weeks), Braxton Hicks contractions can happen more often and may become stronger. These contractions are sometimes difficult to tell apart from true labor because they can be very uncomfortable. You should not feel embarrassed if you go to the hospital with false labor. °Sometimes, the only way to tell if you are in true labor is for your health care provider to look for changes in the cervix. The health care provider will do a physical exam and may monitor your contractions. If   you are not in true labor, the exam  should show that your cervix is not dilating and your water has not broken. °If there are no prenatal problems or other health problems associated with your pregnancy, it is completely safe for you to be sent home with false labor. You may continue to have Braxton Hicks contractions until you go into true labor. °How can I tell the difference between true labor and false labor? °· Differences °? False labor °? Contractions last 30-70 seconds.: Contractions are usually shorter and not as strong as true labor contractions. °? Contractions become very regular.: Contractions are usually irregular. °? Discomfort is usually felt in the top of the uterus, and it spreads to the lower abdomen and low back.: Contractions are often felt in the front of the lower abdomen and in the groin. °? Contractions do not go away with walking.: Contractions may go away when you walk around or change positions while lying down. °? Contractions usually become more intense and increase in frequency.: Contractions get weaker and are shorter-lasting as time goes on. °? The cervix dilates and gets thinner.: The cervix usually does not dilate or become thin. °Follow these instructions at home: °· Take over-the-counter and prescription medicines only as told by your health care provider. °· Keep up with your usual exercises and follow other instructions from your health care provider. °· Eat and drink lightly if you think you are going into labor. °· If Braxton Hicks contractions are making you uncomfortable: °? Change your position from lying down or resting to walking, or change from walking to resting. °? Sit and rest in a tub of warm water. °? Drink enough fluid to keep your urine clear or pale yellow. Dehydration may cause these contractions. °? Do slow and deep breathing several times an hour. °· Keep all follow-up prenatal visits as told by your health care provider. This is important. °Contact a health care provider if: °· You have a  fever. °· You have continuous pain in your abdomen. °Get help right away if: °· Your contractions become stronger, more regular, and closer together. °· You have fluid leaking or gushing from your vagina. °· You pass blood-tinged mucus (bloody show). °· You have bleeding from your vagina. °· You have low back pain that you never had before. °· You feel your baby’s head pushing down and causing pelvic pressure. °· Your baby is not moving inside you as much as it used to. °Summary °· Contractions that occur before labor are called Braxton Hicks contractions, false labor, or practice contractions. °· Braxton Hicks contractions are usually shorter, weaker, farther apart, and less regular than true labor contractions. True labor contractions usually become progressively stronger and regular and they become more frequent. °· Manage discomfort from Braxton Hicks contractions by changing position, resting in a warm bath, drinking plenty of water, or practicing deep breathing. °This information is not intended to replace advice given to you by your health care provider. Make sure you discuss any questions you have with your health care provider. °Document Released: 09/19/2005 Document Revised: 08/08/2016 Document Reviewed: 08/08/2016 °Elsevier Interactive Patient Education © 2017 [employer] ° °

## 2017-05-22 NOTE — Progress Notes (Signed)
   PRENATAL VISIT NOTE  Subjective:  Natalie Riggs is a 26 y.o. [phone] at [redacted]w[redacted]d being seen today for ongoing prenatal care.  She is currently monitored for the following issues for this high-risk pregnancy and has Hyperemesis affecting pregnancy, antepartum; Acute upper respiratory infection; GBS bacteriuria; Supervision of other normal pregnancy, antepartum; Pregnancy; Electrolyte disturbance; and Anemia of pregnancy on her problem list.  Patient reports no complaints.  Contractions: Not present. Vag. Bleeding: None.  Movement: Present. Denies leaking of fluid.   The following portions of the patient's history were reviewed and updated as appropriate: allergies, current medications, past family history, past medical history, past social history, past surgical history and problem list. Problem list updated.  Objective:   Vitals:   05/22/17 1306  BP: 116/68  Pulse: 99  Weight: 161 lb 9.6 oz (73.3 kg)    Fetal Status: Fetal Heart Rate (bpm): 145 Fundal Height: 34 cm Movement: Present     General:  Alert, oriented and cooperative. Patient is in no acute distress.  Skin: Skin is warm and dry. No rash noted.   Cardiovascular: Normal heart rate noted  Respiratory: Normal respiratory effort, no problems with respiration noted  Abdomen: Soft, gravid, appropriate for gestational age.  Pain/Pressure: Absent     Pelvic: Cervical exam deferred        Extremities: Normal range of motion.  Edema: None  Mental Status:  Normal mood and affect. Normal behavior. Normal judgment and thought content.   Assessment and Plan:  Pregnancy: [id number] at [redacted]w[redacted]d  1. Supervision of other normal pregnancy, antepartum - Doing well, no complaints  - Discussed GBS, GC/Chlamydia at next visit   Preterm labor symptoms and general obstetric precautions including but not limited to vaginal bleeding, contractions, leaking of fluid and fetal movement were reviewed in detail with the patient. Please refer to After  Visit Summary for other counseling recommendations.  Return in about 2 weeks (around 06/05/2017) for LOB.   Vonzella Nipple, PA-C

## 2017-06-06 ENCOUNTER — Other Ambulatory Visit (HOSPITAL_COMMUNITY)
Admission: RE | Admit: 2017-06-06 | Discharge: 2017-06-06 | Disposition: A | Discharge status: Home / Self Care | Payer: Medicaid Other | Source: Ambulatory Visit | Attending: Medical | Admitting: Medical

## 2017-06-06 ENCOUNTER — Ambulatory Visit (INDEPENDENT_AMBULATORY_CARE_PROVIDER_SITE_OTHER): Payer: Medicaid Other | Admitting: Medical

## 2017-06-06 VITALS — BP 128/72 | HR 88 | Wt 166.5 lb

## 2017-06-06 DIAGNOSIS — R8271 Bacteriuria: Secondary | ICD-10-CM

## 2017-06-06 DIAGNOSIS — Z331 Pregnant state, incidental: Secondary | ICD-10-CM

## 2017-06-06 DIAGNOSIS — Z3483 Encounter for supervision of other normal pregnancy, third trimester: Secondary | ICD-10-CM | POA: Diagnosis not present

## 2017-06-06 DIAGNOSIS — Z113 Encounter for screening for infections with a predominantly sexual mode of transmission: Secondary | ICD-10-CM

## 2017-06-06 DIAGNOSIS — Z3480 Encounter for supervision of other normal pregnancy, unspecified trimester: Secondary | ICD-10-CM

## 2017-06-06 LAB — OB RESULTS CONSOLE GC/CHLAMYDIA: GC PROBE AMP, GENITAL: NEGATIVE

## 2017-06-06 NOTE — Patient Instructions (Signed)
Fetal Movement Counts °Patient Name: ________________________________________________ Patient Due Date: ____________________ °What is a fetal movement count? °A fetal movement count is the number of times that you feel your baby move during a certain amount of time. This may also be called a fetal kick count. A fetal movement count is recommended for every pregnant woman. You may be asked to start counting fetal movements as early as week 28 of your pregnancy. °Pay attention to when your baby is most active. You may notice your baby's sleep and wake cycles. You may also notice things that make your baby move more. You should do a fetal movement count: °· When your baby is normally most active. °· At the same time each day. ° °A good time to count movements is while you are resting, after having something to eat and drink. °How do I count fetal movements? °1. Find a quiet, comfortable area. Sit, or lie down on your side. °2. Write down the date, the start time and stop time, and the number of movements that you felt between those two times. Take this information with you to your health care visits. °3. For 2 hours, count kicks, flutters, swishes, rolls, and jabs. You should feel at least 10 movements during 2 hours. °4. You may stop counting after you have felt 10 movements. °5. If you do not feel 10 movements in 2 hours, have something to eat and drink. Then, keep resting and counting for 1 hour. If you feel at least 4 movements during that hour, you may stop counting. °Contact a health care provider if: °· You feel fewer than 4 movements in 2 hours. °· Your baby is not moving like he or she usually does. °Date: ____________ Start time: ____________ Stop time: ____________ Movements: ____________ °Date: ____________ Start time: ____________ Stop time: ____________ Movements: ____________ °Date: ____________ Start time: ____________ Stop time: ____________ Movements: ____________ °Date: ____________ Start time:  ____________ Stop time: ____________ Movements: ____________ °Date: ____________ Start time: ____________ Stop time: ____________ Movements: ____________ °Date: ____________ Start time: ____________ Stop time: ____________ Movements: ____________ °Date: ____________ Start time: ____________ Stop time: ____________ Movements: ____________ °Date: ____________ Start time: ____________ Stop time: ____________ Movements: ____________ °Date: ____________ Start time: ____________ Stop time: ____________ Movements: ____________ °This information is not intended to replace advice given to you by your health care provider. Make sure you discuss any questions you have with your health care provider. °Document Released: 10/19/2006 Document Revised: 05/18/2016 Document Reviewed: 10/29/2015 °Elsevier Interactive Patient Education © 2018 [employer] °Braxton Hicks Contractions °Contractions of the uterus can occur throughout pregnancy, but they are not always a sign that you are in labor. You may have practice contractions called Braxton Hicks contractions. These false labor contractions are sometimes confused with true labor. °What are Braxton Hicks contractions? °Braxton Hicks contractions are tightening movements that occur in the muscles of the uterus before labor. Unlike true labor contractions, these contractions do not result in opening (dilation) and thinning of the cervix. Toward the end of pregnancy (32-34 weeks), Braxton Hicks contractions can happen more often and may become stronger. These contractions are sometimes difficult to tell apart from true labor because they can be very uncomfortable. You should not feel embarrassed if you go to the hospital with false labor. °Sometimes, the only way to tell if you are in true labor is for your health care provider to look for changes in the cervix. The health care provider will do a physical exam and may monitor your contractions. If   you are not in true labor, the exam  should show that your cervix is not dilating and your water has not broken. °If there are no prenatal problems or other health problems associated with your pregnancy, it is completely safe for you to be sent home with false labor. You may continue to have Braxton Hicks contractions until you go into true labor. °How can I tell the difference between true labor and false labor? °· Differences °? False labor °? Contractions last 30-70 seconds.: Contractions are usually shorter and not as strong as true labor contractions. °? Contractions become very regular.: Contractions are usually irregular. °? Discomfort is usually felt in the top of the uterus, and it spreads to the lower abdomen and low back.: Contractions are often felt in the front of the lower abdomen and in the groin. °? Contractions do not go away with walking.: Contractions may go away when you walk around or change positions while lying down. °? Contractions usually become more intense and increase in frequency.: Contractions get weaker and are shorter-lasting as time goes on. °? The cervix dilates and gets thinner.: The cervix usually does not dilate or become thin. °Follow these instructions at home: °· Take over-the-counter and prescription medicines only as told by your health care provider. °· Keep up with your usual exercises and follow other instructions from your health care provider. °· Eat and drink lightly if you think you are going into labor. °· If Braxton Hicks contractions are making you uncomfortable: °? Change your position from lying down or resting to walking, or change from walking to resting. °? Sit and rest in a tub of warm water. °? Drink enough fluid to keep your urine clear or pale yellow. Dehydration may cause these contractions. °? Do slow and deep breathing several times an hour. °· Keep all follow-up prenatal visits as told by your health care provider. This is important. °Contact a health care provider if: °· You have a  fever. °· You have continuous pain in your abdomen. °Get help right away if: °· Your contractions become stronger, more regular, and closer together. °· You have fluid leaking or gushing from your vagina. °· You pass blood-tinged mucus (bloody show). °· You have bleeding from your vagina. °· You have low back pain that you never had before. °· You feel your baby’s head pushing down and causing pelvic pressure. °· Your baby is not moving inside you as much as it used to. °Summary °· Contractions that occur before labor are called Braxton Hicks contractions, false labor, or practice contractions. °· Braxton Hicks contractions are usually shorter, weaker, farther apart, and less regular than true labor contractions. True labor contractions usually become progressively stronger and regular and they become more frequent. °· Manage discomfort from Braxton Hicks contractions by changing position, resting in a warm bath, drinking plenty of water, or practicing deep breathing. °This information is not intended to replace advice given to you by your health care provider. Make sure you discuss any questions you have with your health care provider. °Document Released: 09/19/2005 Document Revised: 08/08/2016 Document Reviewed: 08/08/2016 °Elsevier Interactive Patient Education © 2017 [employer] ° °

## 2017-06-06 NOTE — Progress Notes (Signed)
   PRENATAL VISIT NOTE  Subjective:  Natalie Riggs is a 26 y.o. [phone] at 1280w1d being seen today for ongoing prenatal care.  She is currently monitored for the following issues for this low-risk pregnancy and has Hyperemesis affecting pregnancy, antepartum; Acute upper respiratory infection; GBS bacteriuria; Supervision of other normal pregnancy, antepartum; Pregnancy; Electrolyte disturbance; and Anemia of pregnancy on her problem list.  Patient reports occasional suprapubic pain.  Contractions: Not present. Vag. Bleeding: None.  Movement: Present. Denies leaking of fluid.   The following portions of the patient's history were reviewed and updated as appropriate: allergies, current medications, past family history, past medical history, past social history, past surgical history and problem list. Problem list updated.  Objective:   Vitals:   06/06/17 1525  BP: 128/72  Pulse: 88  Weight: 166 lb 8 oz (75.5 kg)    Fetal Status: Fetal Heart Rate (bpm): 137 Fundal Height: 36 cm Movement: Present     General:  Alert, oriented and cooperative. Patient is in no acute distress.  Skin: Skin is warm and dry. No rash noted.   Cardiovascular: Normal heart rate noted  Respiratory: Normal respiratory effort, no problems with respiration noted  Abdomen: Soft, gravid, appropriate for gestational age.  Pain/Pressure: Absent     Pelvic: Cervical exam deferred        Extremities: Normal range of motion.  Edema: None  Mental Status:  Normal mood and affect. Normal behavior. Normal judgment and thought content.   Assessment and Plan:  Pregnancy: [id number] at 4280w1d  1. Supervision of other normal pregnancy, antepartum - Doing well - GC/Chlmaydia, urine today  - Patient would like to defer cervical exam to next visit   2. GBS bacteriuria - Will treat in labor, discussed with patient  Preterm labor symptoms and general obstetric precautions including but not limited to vaginal bleeding,  contractions, leaking of fluid and fetal movement were reviewed in detail with the patient. Please refer to After Visit Summary for other counseling recommendations.  Return in about 1 week (around 06/13/2017) for LOB.   Vonzella NippleJulie Breydan [place], PA-C

## 2017-06-08 LAB — GC/CHLAMYDIA PROBE AMP (~~LOC~~) NOT AT ARMC
Chlamydia: NEGATIVE
Neisseria Gonorrhea: NEGATIVE

## 2017-06-12 ENCOUNTER — Encounter: Payer: Self-pay | Admitting: Advanced Practice Midwife

## 2017-06-12 ENCOUNTER — Ambulatory Visit (INDEPENDENT_AMBULATORY_CARE_PROVIDER_SITE_OTHER): Payer: Medicaid Other | Admitting: Advanced Practice Midwife

## 2017-06-12 VITALS — BP 120/67 | HR 80 | Wt 168.5 lb

## 2017-06-12 DIAGNOSIS — Z3483 Encounter for supervision of other normal pregnancy, third trimester: Secondary | ICD-10-CM

## 2017-06-12 DIAGNOSIS — Z3480 Encounter for supervision of other normal pregnancy, unspecified trimester: Secondary | ICD-10-CM

## 2017-06-12 DIAGNOSIS — R8271 Bacteriuria: Secondary | ICD-10-CM

## 2017-06-12 NOTE — Patient Instructions (Signed)
Group B Streptococcus Infection During Pregnancy Group B Streptococcus (GBS) is a type of bacteria (Streptococcus agalactiae) that is often found in healthy people, commonly in the rectum, vagina, and intestines. In people who are healthy and not pregnant, the bacteria rarely cause serious illness or complications. However, women who test positive for GBS during pregnancy can pass the bacteria to their baby during childbirth, which can cause serious infection in the baby after birth. Women with GBS may also have infections during their pregnancy or immediately after childbirth, such as such as urinary tract infections (UTIs) or infections of the uterus (uterine infections). Having GBS also increases a woman's risk of complications during pregnancy, such as early (preterm) labor or delivery, miscarriage, or stillbirth. Routine testing (screening) for GBS is recommended for all pregnant women. What increases the risk? You may have a higher risk for GBS infection during pregnancy if you had one during a past pregnancy. What are the signs or symptoms? In most cases, GBS infection does not cause symptoms in pregnant women. Signs and symptoms of a possible GBS-related infection may include:  Labor starting before the 37th week of pregnancy.  A UTI or bladder infection, which may cause: ? Fever. ? Pain or burning during urination. ? Frequent urination.  Fever during labor, along with: ? Bad-smelling discharge. ? Uterine tenderness. ? Rapid heartbeat in the mother, baby, or both.  Rare but serious symptoms of a possible GBS-related infection in women include:  Blood infection (septicemia). This may cause fever, chills, or confusion.  Lung infection (pneumonia). This may cause fever, chills, cough, rapid breathing, difficulty breathing, or chest pain.  Bone, joint, skin, or soft tissue infection.  How is this diagnosed? You may be screened for GBS between week 35 and week 37 of your pregnancy. If  you have symptoms of preterm labor, you may be screened earlier. This condition is diagnosed based on lab test results from:  A swab of fluid from the vagina and rectum.  A urine sample.  How is this treated? This condition is treated with antibiotic medicine. When you go into labor, or as soon as your water breaks (your membranes rupture), you will be given antibiotics through an IV tube. Antibiotics will continue until after you give birth. If you are having a cesarean delivery, you do not need antibiotics unless your membranes have already ruptured. Follow these instructions at home:  Take over-the-counter and prescription medicines only as told by your health care provider.  Take your antibiotic medicine as told by your health care provider. Do not stop taking the antibiotic even if you start to feel better.  Keep all pre-birth (prenatal) visits and follow-up visits as told by your health care provider. This is important. Contact a health care provider if:  You have pain or burning when you urinate.  You have to urinate frequently.  You have a fever or chills.  You develop a bad-smelling vaginal discharge. Get help right away if:  Your membranes rupture.  You go into labor.  You have severe pain in your abdomen.  You have difficulty breathing.  You have chest pain. This information is not intended to replace advice given to you by your health care provider. Make sure you discuss any questions you have with your health care provider. Document Released: 12/27/2007 Document Revised: 04/15/2016 Document Reviewed: 04/14/2016 Elsevier Interactive Patient Education  2018 [employer].    [employer] of the uterus can occur throughout pregnancy, but they are not always  a sign that you are in labor. You may have practice contractions called Braxton Hicks contractions. These false labor contractions are sometimes confused with true labor. What are  Deberah Pelton contractions? Braxton Hicks contractions are tightening movements that occur in the muscles of the uterus before labor. Unlike true labor contractions, these contractions do not result in opening (dilation) and thinning of the cervix. Toward the end of pregnancy (32-34 weeks), Braxton Hicks contractions can happen more often and may become stronger. These contractions are sometimes difficult to tell apart from true labor because they can be very uncomfortable. You should not feel embarrassed if you go to the hospital with false labor. Sometimes, the only way to tell if you are in true labor is for your health care provider to look for changes in the cervix. The health care provider will do a physical exam and may monitor your contractions. If you are not in true labor, the exam should show that your cervix is not dilating and your water has not broken. If there are no prenatal problems or other health problems associated with your pregnancy, it is completely safe for you to be sent home with false labor. You may continue to have Braxton Hicks contractions until you go into true labor. How can I tell the difference between true labor and false labor?  Differences ? False labor ? Contractions last 30-70 seconds.: Contractions are usually shorter and not as strong as true labor contractions. ? Contractions become very regular.: Contractions are usually irregular. ? Discomfort is usually felt in the top of the uterus, and it spreads to the lower abdomen and low back.: Contractions are often felt in the front of the lower abdomen and in the groin. ? Contractions do not go away with walking.: Contractions may go away when you walk around or change positions while lying down. ? Contractions usually become more intense and increase in frequency.: Contractions get weaker and are shorter-lasting as time goes on. ? The cervix dilates and gets thinner.: The cervix usually does not dilate or become  thin. Follow these instructions at home:  Take over-the-counter and prescription medicines only as told by your health care provider.  Keep up with your usual exercises and follow other instructions from your health care provider.  Eat and drink lightly if you think you are going into labor.  If Braxton Hicks contractions are making you uncomfortable: ? Change your position from lying down or resting to walking, or change from walking to resting. ? Sit and rest in a tub of warm water. ? Drink enough fluid to keep your urine clear or pale yellow. Dehydration may cause these contractions. ? Do slow and deep breathing several times an hour.  Keep all follow-up prenatal visits as told by your health care provider. This is important. Contact a health care provider if:  You have a fever.  You have continuous pain in your abdomen. Get help right away if:  Your contractions become stronger, more regular, and closer together.  You have fluid leaking or gushing from your vagina.  You pass blood-tinged mucus (bloody show).  You have bleeding from your vagina.  You have low back pain that you never had before.  You feel your baby's head pushing down and causing pelvic pressure.  Your baby is not moving inside you as much as it used to. Summary  Contractions that occur before labor are called Braxton Hicks contractions, false labor, or practice contractions.  Braxton Hicks contractions are usually  shorter, weaker, farther apart, and less regular than true labor contractions. True labor contractions usually become progressively stronger and regular and they become more frequent.  Manage discomfort from [facility] contractions by changing position, resting in a warm bath, drinking plenty of water, or practicing deep breathing. This information is not intended to replace advice given to you by your health care provider. Make sure you discuss any questions you have with your health care  provider. Document Released: 09/19/2005 Document Revised: 08/08/2016 Document Reviewed: 08/08/2016 Elsevier Interactive Patient Education  2017 [employer]

## 2017-06-12 NOTE — Progress Notes (Signed)
   PRENATAL VISIT NOTE  Subjective:  Natalie Riggs is a 26 y.o. [id number] at 8667w0d being seen today for ongoing prenatal care.  She is currently monitored for the following issues for this low-risk pregnancy and has Hyperemesis affecting pregnancy, antepartum; Acute upper respiratory infection; GBS bacteriuria; Supervision of other normal pregnancy, antepartum; Pregnancy; Electrolyte disturbance; and Anemia of pregnancy on her problem list.  Patient reports no complaints.  Contractions: Not present.  .  Movement: Present. Denies leaking of fluid.   The following portions of the patient's history were reviewed and updated as appropriate: allergies, current medications, past family history, past medical history, past social history, past surgical history and problem list. Problem list updated.  Objective:   Vitals:   06/12/17 1021  BP: 120/67  Pulse: 80  Weight: 168 lb 8 oz (76.4 kg)    Fetal Status: Fetal Heart Rate (bpm): 151 Fundal Height: 36 cm Movement: Present  Presentation: Vertex  General:  Alert, oriented and cooperative. Patient is in no acute distress.  Skin: Skin is warm and dry. No rash noted.   Cardiovascular: Normal heart rate noted  Respiratory: Normal respiratory effort, no problems with respiration noted  Abdomen: Soft, gravid, appropriate for gestational age.  Pain/Pressure: Absent     Pelvic: Cervical exam performed Dilation: 1 Effacement (%): 0 Station: Ballotable  Extremities: Normal range of motion.  Edema: None  Mental Status:  Normal mood and affect. Normal behavior. Normal judgment and thought content.   Assessment and Plan:  Pregnancy: [id number] at 9367w0d  There are no diagnoses linked to this encounter. Term labor symptoms and general obstetric precautions including but not limited to vaginal bleeding, contractions, leaking of fluid and fetal movement were reviewed in detail with the patient. Please refer to After Visit Summary for other counseling  recommendations.  Return in about 1 week (around 06/19/2017) for ROB.   Dorathy KinsmanVirginia Keon Pender, CNM

## 2017-06-17 ENCOUNTER — Observation Stay
Admission: EM | Admit: 2017-06-17 | Discharge: 2017-06-17 | Disposition: A | Discharge status: Home / Self Care | Payer: Medicaid Other | Attending: Obstetrics and Gynecology | Admitting: Obstetrics and Gynecology

## 2017-06-17 DIAGNOSIS — Z3A37 37 weeks gestation of pregnancy: Secondary | ICD-10-CM | POA: Diagnosis not present

## 2017-06-17 DIAGNOSIS — O471 False labor at or after 37 completed weeks of gestation: Principal | ICD-10-CM | POA: Insufficient documentation

## 2017-06-17 NOTE — Discharge Instructions (Signed)

## 2017-06-17 NOTE — OB Triage Note (Signed)
Pt arrived to unit with c/o contractions and overall discomfort. Pt place on the monitor and evaluated for contractions and cervical change. FHR tracing category I, cervix fingertip, rare contractions and uterine irritability noted. Pt stated that she only felt a few random contractions while on the monitor. Pt to be discharged per Dr. Logan Bores.

## 2017-06-18 NOTE — Discharge Summary (Signed)
    L&D OB Triage Note  SUBJECTIVE Natalie Riggs is a 26 y.o. [id number] female at [redacted]w[redacted]d, EDD Estimated Date of Delivery: 07/03/17 who presented to triage with complaints of contractions.   Obstetric History   G4   P2   T2   P0   A1   L2    SAB1   TAB0   Ectopic0   Multiple0   Live Births2     # Outcome Date GA Lbr Len/2nd Weight Sex Delivery Anes PTL Lv  4 Current           3 SAB 12/02/14          2 Term 10/20/10     Vag-Spont   LIV  1 Term 05/06/09     Vag-Spont   LIV      No prescriptions prior to admission.     OBJECTIVE  Nursing Evaluation:   BP 105/66 (BP Location: Left Arm)   Pulse (!) 102   Temp 98.3 F (36.8 C) (Oral)   Resp 18   LMP 07/30/2016 (Exact Date)    Findings:  No cervical change - rare contractions  NST was performed and has been reviewed by me.  NST INTERPRETATION: Category I  Mode: External Baseline Rate (A): 135 bpm Variability: Moderate Accelerations: 15 x 15 Decelerations: None     Contraction Frequency (min): rare, irregular uterine irritability  ASSESSMENT Impression:  1. Pregnancy:  [id number] at [redacted]w[redacted]d , EDD Estimated Date of Delivery: 07/03/17 2.  reactive  PLAN 1. Reassurance given 2. Discharge home with precautions to return to L&D  3. Continue routine prenatal care.

## 2017-06-19 ENCOUNTER — Ambulatory Visit (INDEPENDENT_AMBULATORY_CARE_PROVIDER_SITE_OTHER): Payer: Medicaid Other | Admitting: Medical

## 2017-06-19 VITALS — BP 124/70 | HR 98 | Wt 169.3 lb

## 2017-06-19 DIAGNOSIS — Z3480 Encounter for supervision of other normal pregnancy, unspecified trimester: Secondary | ICD-10-CM

## 2017-06-19 DIAGNOSIS — Z3483 Encounter for supervision of other normal pregnancy, third trimester: Secondary | ICD-10-CM

## 2017-06-19 NOTE — Patient Instructions (Signed)
Fetal Movement Counts °Patient Name: ________________________________________________ Patient Due Date: ____________________ °What is a fetal movement count? °A fetal movement count is the number of times that you feel your baby move during a certain amount of time. This may also be called a fetal kick count. A fetal movement count is recommended for every pregnant woman. You may be asked to start counting fetal movements as early as week 28 of your pregnancy. °Pay attention to when your baby is most active. You may notice your baby's sleep and wake cycles. You may also notice things that make your baby move more. You should do a fetal movement count: °· When your baby is normally most active. °· At the same time each day. ° °A good time to count movements is while you are resting, after having something to eat and drink. °How do I count fetal movements? °1. Find a quiet, comfortable area. Sit, or lie down on your side. °2. Write down the date, the start time and stop time, and the number of movements that you felt between those two times. Take this information with you to your health care visits. °3. For 2 hours, count kicks, flutters, swishes, rolls, and jabs. You should feel at least 10 movements during 2 hours. °4. You may stop counting after you have felt 10 movements. °5. If you do not feel 10 movements in 2 hours, have something to eat and drink. Then, keep resting and counting for 1 hour. If you feel at least 4 movements during that hour, you may stop counting. °Contact a health care provider if: °· You feel fewer than 4 movements in 2 hours. °· Your baby is not moving like he or she usually does. °Date: ____________ Start time: ____________ Stop time: ____________ Movements: ____________ °Date: ____________ Start time: ____________ Stop time: ____________ Movements: ____________ °Date: ____________ Start time: ____________ Stop time: ____________ Movements: ____________ °Date: ____________ Start time:  ____________ Stop time: ____________ Movements: ____________ °Date: ____________ Start time: ____________ Stop time: ____________ Movements: ____________ °Date: ____________ Start time: ____________ Stop time: ____________ Movements: ____________ °Date: ____________ Start time: ____________ Stop time: ____________ Movements: ____________ °Date: ____________ Start time: ____________ Stop time: ____________ Movements: ____________ °Date: ____________ Start time: ____________ Stop time: ____________ Movements: ____________ °This information is not intended to replace advice given to you by your health care provider. Make sure you discuss any questions you have with your health care provider. °Document Released: 10/19/2006 Document Revised: 05/18/2016 Document Reviewed: 10/29/2015 °Elsevier Interactive Patient Education © 2018 [employer] °Braxton Hicks Contractions °Contractions of the uterus can occur throughout pregnancy, but they are not always a sign that you are in labor. You may have practice contractions called Braxton Hicks contractions. These false labor contractions are sometimes confused with true labor. °What are Braxton Hicks contractions? °Braxton Hicks contractions are tightening movements that occur in the muscles of the uterus before labor. Unlike true labor contractions, these contractions do not result in opening (dilation) and thinning of the cervix. Toward the end of pregnancy (32-34 weeks), Braxton Hicks contractions can happen more often and may become stronger. These contractions are sometimes difficult to tell apart from true labor because they can be very uncomfortable. You should not feel embarrassed if you go to the hospital with false labor. °Sometimes, the only way to tell if you are in true labor is for your health care provider to look for changes in the cervix. The health care provider will do a physical exam and may monitor your contractions. If   you are not in true labor, the exam  should show that your cervix is not dilating and your water has not broken. °If there are no prenatal problems or other health problems associated with your pregnancy, it is completely safe for you to be sent home with false labor. You may continue to have Braxton Hicks contractions until you go into true labor. °How can I tell the difference between true labor and false labor? °· Differences °? False labor °? Contractions last 30-70 seconds.: Contractions are usually shorter and not as strong as true labor contractions. °? Contractions become very regular.: Contractions are usually irregular. °? Discomfort is usually felt in the top of the uterus, and it spreads to the lower abdomen and low back.: Contractions are often felt in the front of the lower abdomen and in the groin. °? Contractions do not go away with walking.: Contractions may go away when you walk around or change positions while lying down. °? Contractions usually become more intense and increase in frequency.: Contractions get weaker and are shorter-lasting as time goes on. °? The cervix dilates and gets thinner.: The cervix usually does not dilate or become thin. °Follow these instructions at home: °· Take over-the-counter and prescription medicines only as told by your health care provider. °· Keep up with your usual exercises and follow other instructions from your health care provider. °· Eat and drink lightly if you think you are going into labor. °· If Braxton Hicks contractions are making you uncomfortable: °? Change your position from lying down or resting to walking, or change from walking to resting. °? Sit and rest in a tub of warm water. °? Drink enough fluid to keep your urine clear or pale yellow. Dehydration may cause these contractions. °? Do slow and deep breathing several times an hour. °· Keep all follow-up prenatal visits as told by your health care provider. This is important. °Contact a health care provider if: °· You have a  fever. °· You have continuous pain in your abdomen. °Get help right away if: °· Your contractions become stronger, more regular, and closer together. °· You have fluid leaking or gushing from your vagina. °· You pass blood-tinged mucus (bloody show). °· You have bleeding from your vagina. °· You have low back pain that you never had before. °· You feel your baby’s head pushing down and causing pelvic pressure. °· Your baby is not moving inside you as much as it used to. °Summary °· Contractions that occur before labor are called Braxton Hicks contractions, false labor, or practice contractions. °· Braxton Hicks contractions are usually shorter, weaker, farther apart, and less regular than true labor contractions. True labor contractions usually become progressively stronger and regular and they become more frequent. °· Manage discomfort from Braxton Hicks contractions by changing position, resting in a warm bath, drinking plenty of water, or practicing deep breathing. °This information is not intended to replace advice given to you by your health care provider. Make sure you discuss any questions you have with your health care provider. °Document Released: 09/19/2005 Document Revised: 08/08/2016 Document Reviewed: 08/08/2016 °Elsevier Interactive Patient Education © 2017 [employer] ° °

## 2017-06-19 NOTE — Progress Notes (Signed)
   PRENATAL VISIT NOTE  Subjective:  Natalie Riggs is a 26 y.o. [phone] at [redacted]w[redacted]d being seen today for ongoing prenatal care.  She is currently monitored for the following issues for this low-risk pregnancy and has Hyperemesis affecting pregnancy, antepartum; GBS bacteriuria; Supervision of other normal pregnancy, antepartum; Electrolyte disturbance; and Anemia of pregnancy on her problem list.  Patient reports occasional contractions.  Contractions: Irregular. Vag. Bleeding: None.  Movement: Present. Denies leaking of fluid.   The following portions of the patient's history were reviewed and updated as appropriate: allergies, current medications, past family history, past medical history, past social history, past surgical history and problem list. Problem list updated.  Objective:   Vitals:   06/19/17 1335  BP: 124/70  Pulse: 98  Weight: 169 lb 4.8 oz (76.8 kg)    Fetal Status: Fetal Heart Rate (bpm): 150 Fundal Height: 38 cm Movement: Present     General:  Alert, oriented and cooperative. Patient is in no acute distress.  Skin: Skin is warm and dry. No rash noted.   Cardiovascular: Normal heart rate noted  Respiratory: Normal respiratory effort, no problems with respiration noted  Abdomen: Soft, gravid, appropriate for gestational age.  Pain/Pressure: Present     Pelvic: Cervical exam deferred        Extremities: Normal range of motion.  Edema: None  Mental Status:  Normal mood and affect. Normal behavior. Normal judgment and thought content.   Assessment and Plan:  Pregnancy: [id number] at [redacted]w[redacted]d  1. Supervision of other normal pregnancy, antepartum - Doing well - False labor evaluation at [facility] since last visit, advised to come to [facility] for future concerns if possible  2. GBS bacteriuria  - Treat in labor  Term labor symptoms and general obstetric precautions including but not limited to vaginal bleeding, contractions, leaking of fluid and fetal movement were reviewed in detail  with the patient. Please refer to After Visit Summary for other counseling recommendations.  Return in about 1 week (around 06/26/2017) for LOB.   Vonzella Nipple, PA-C

## 2017-06-26 ENCOUNTER — Ambulatory Visit (INDEPENDENT_AMBULATORY_CARE_PROVIDER_SITE_OTHER): Payer: Medicaid Other | Admitting: Obstetrics & Gynecology

## 2017-06-26 ENCOUNTER — Encounter: Payer: Medicaid Other | Admitting: Certified Nurse Midwife

## 2017-06-26 VITALS — BP 119/68 | HR 84 | Wt 174.2 lb

## 2017-06-26 DIAGNOSIS — Z3480 Encounter for supervision of other normal pregnancy, unspecified trimester: Secondary | ICD-10-CM

## 2017-06-26 DIAGNOSIS — R8271 Bacteriuria: Secondary | ICD-10-CM

## 2017-06-26 NOTE — Progress Notes (Signed)
   PRENATAL VISIT NOTE  Subjective:  Natalie Riggs is a 26 y.o. [phone] at [redacted]w[redacted]d being seen today for ongoing prenatal care.  She is currently monitored for the following issues for this low-risk pregnancy and has Hyperemesis affecting pregnancy, antepartum; GBS bacteriuria; Supervision of other normal pregnancy, antepartum; Electrolyte disturbance; and Anemia of pregnancy on her problem list.  Patient reports no complaints.  Contractions: Irregular. Vag. Bleeding: None.  Movement: Present. Denies leaking of fluid.   The following portions of the patient's history were reviewed and updated as appropriate: allergies, current medications, past family history, past medical history, past social history, past surgical history and problem list. Problem list updated.  Objective:   Vitals:   06/26/17 1005  BP: 119/68  Pulse: 84  Weight: 174 lb 3.2 oz (79 kg)    Fetal Status: Fetal Heart Rate (bpm): 149   Movement: Present     General:  Alert, oriented and cooperative. Patient is in no acute distress.  Skin: Skin is warm and dry. No rash noted.   Cardiovascular: Normal heart rate noted  Respiratory: Normal respiratory effort, no problems with respiration noted  Abdomen: Soft, gravid, appropriate for gestational age.  Pain/Pressure: Present     Pelvic: Cervical exam performed        Extremities: Normal range of motion.  Edema: Trace  Mental Status:  Normal mood and affect. Normal behavior. Normal judgment and thought content.   Assessment and Plan:  Pregnancy: [id number] at [redacted]w[redacted]d  1. Supervision of other normal pregnancy, antepartum   2. GBS bacteriuria - Treat in labor  Term labor symptoms and general obstetric precautions including but not limited to vaginal bleeding, contractions, leaking of fluid and fetal movement were reviewed in detail with the patient. Please refer to After Visit Summary for other counseling recommendations.  No Follow-up on file.   Allie Bossier, MD

## 2017-07-03 ENCOUNTER — Telehealth (HOSPITAL_COMMUNITY): Payer: Self-pay | Admitting: *Deleted

## 2017-07-03 ENCOUNTER — Ambulatory Visit (INDEPENDENT_AMBULATORY_CARE_PROVIDER_SITE_OTHER): Payer: Self-pay | Admitting: Advanced Practice Midwife

## 2017-07-03 ENCOUNTER — Other Ambulatory Visit: Payer: Self-pay | Admitting: Obstetrics and Gynecology

## 2017-07-03 VITALS — BP 130/76 | HR 106 | Wt 173.2 lb

## 2017-07-03 DIAGNOSIS — Z3483 Encounter for supervision of other normal pregnancy, third trimester: Secondary | ICD-10-CM

## 2017-07-03 DIAGNOSIS — Z3480 Encounter for supervision of other normal pregnancy, unspecified trimester: Secondary | ICD-10-CM

## 2017-07-03 NOTE — Progress Notes (Signed)
   PRENATAL VISIT NOTE  Subjective:  Natalie Riggs is a 26 y.o. [phone] at [redacted]w[redacted]d being seen today for ongoing prenatal care.  She is currently monitored for the following issues for this low-risk pregnancy and has Hyperemesis affecting pregnancy, antepartum; GBS bacteriuria; Supervision of other normal pregnancy, antepartum; Electrolyte disturbance; and Anemia of pregnancy on her problem list.  Patient reports contractions since around 1100.  Contractions: Irregular. Vag. Bleeding: None.  Movement: Present. Denies leaking of fluid.   The following portions of the patient's history were reviewed and updated as appropriate: allergies, current medications, past family history, past medical history, past social history, past surgical history and problem list. Problem list updated.  Objective:   Vitals:   07/03/17 1324  BP: 130/76  Pulse: (!) 106  Weight: 173 lb 3.2 oz (78.6 kg)    Fetal Status: Fetal Heart Rate (bpm): 153   Movement: Present     General:  Alert, oriented and cooperative. Patient is in no acute distress.  Skin: Skin is warm and dry. No rash noted.   Cardiovascular: Normal heart rate noted  Respiratory: Normal respiratory effort, no problems with respiration noted  Abdomen: Soft, gravid, appropriate for gestational age.  Pain/Pressure: Present     Pelvic: Cervical exam performed        Extremities: Normal range of motion.  Edema: Trace  Mental Status:  Normal mood and affect. Normal behavior. Normal judgment and thought content.   Assessment and Plan:  Pregnancy: [id number] at [redacted]w[redacted]d  1. Supervision of other normal pregnancy, antepartum - No cervical change from last week.  - patient reports that she uncomfortable today. Will schedule for IOL at 41 weeks, and AFI/NST later this week.  -Patient upset that we are not keeping her today. Given strict return precautions and advised that she can return any time for labor evaluation.  -IOL scheduled for 10/8 at 6:30 am  Term  labor symptoms and general obstetric precautions including but not limited to vaginal bleeding, contractions, leaking of fluid and fetal movement were reviewed in detail with the patient. Please refer to After Visit Summary for other counseling recommendations.  Return in about 1 week (around 07/10/2017).   Thressa Sheller, CNM

## 2017-07-03 NOTE — Telephone Encounter (Signed)
Preadmission screen  

## 2017-07-03 NOTE — Patient Instructions (Addendum)
Induction of labor scheduled for 07/10/17 at 6:30 am at [facility].     Third Trimester of Pregnancy The third trimester is from week 28 through week 40 (months 7 through 9). The third trimester is a time when the unborn baby (fetus) is growing rapidly. At the end of the ninth month, the fetus is about 20 inches in length and weighs 6-10 pounds. Body changes during your third trimester Your body will continue to go through many changes during pregnancy. The changes vary from woman to woman. During the third trimester:  Your weight will continue to increase. You can expect to gain 25-35 pounds (11-16 kg) by the end of the pregnancy.  You may begin to get stretch marks on your hips, abdomen, and breasts.  You may urinate more often because the fetus is moving lower into your pelvis and pressing on your bladder.  You may develop or continue to have heartburn. This is caused by increased hormones that slow down muscles in the digestive tract.  You may develop or continue to have constipation because increased hormones slow digestion and cause the muscles that push waste through your intestines to relax.  You may develop hemorrhoids. These are swollen veins (varicose veins) in the rectum that can itch or be painful.  You may develop swollen, bulging veins (varicose veins) in your legs.  You may have increased body aches in the pelvis, back, or thighs. This is due to weight gain and increased hormones that are relaxing your joints.  You may have changes in your hair. These can include thickening of your hair, rapid growth, and changes in texture. Some women also have hair loss during or after pregnancy, or hair that feels dry or thin. Your hair will most likely return to normal after your baby is born.  Your breasts will continue to grow and they will continue to become tender. A yellow fluid (colostrum) may leak from your breasts. This is the first milk you are producing for  your baby.  Your belly button may stick out.  You may notice more swelling in your hands, face, or ankles.  You may have increased tingling or numbness in your hands, arms, and legs. The skin on your belly may also feel numb.  You may feel short of breath because of your expanding uterus.  You may have more problems sleeping. This can be caused by the size of your belly, increased need to urinate, and an increase in your body's metabolism.  You may notice the fetus "dropping," or moving lower in your abdomen (lightening).  You may have increased vaginal discharge.  You may notice your joints feel loose and you may have pain around your pelvic bone.  What to expect at prenatal visits You will have prenatal exams every 2 weeks until week 36. Then you will have weekly prenatal exams. During a routine prenatal visit:  You will be weighed to make sure you and the baby are growing normally.  Your blood pressure will be taken.  Your abdomen will be measured to track your baby's growth.  The fetal heartbeat will be listened to.  Any test results from the previous visit will be discussed.  You may have a cervical check near your due date to see if your cervix has softened or thinned (effaced).  You will be tested for Group B streptococcus. This happens between 35 and 37 weeks.  Your health care provider may ask you:  What your birth plan is.  How you are feeling.  If you are feeling the baby move.  If you have had any abnormal symptoms, such as leaking fluid, bleeding, severe headaches, or abdominal cramping.  If you are using any tobacco products, including cigarettes, chewing tobacco, and electronic cigarettes.  If you have any questions.  Other tests or screenings that may be performed during your third trimester include:  Blood tests that check for low iron levels (anemia).  Fetal testing to check the health, activity level, and growth of the fetus. Testing is done if  you have certain medical conditions or if there are problems during the pregnancy.  Nonstress test (NST). This test checks the health of your baby to make sure there are no signs of problems, such as the baby not getting enough oxygen. During this test, a belt is placed around your belly. The baby is made to move, and its heart rate is monitored during movement.  What is false labor? False labor is a condition in which you feel small, irregular tightenings of the muscles in the womb (contractions) that usually go away with rest, changing position, or drinking water. These are called Braxton Hicks contractions. Contractions may last for hours, days, or even weeks before true labor sets in. If contractions come at regular intervals, become more frequent, increase in intensity, or become painful, you should see your health care provider. What are the signs of labor?  Abdominal cramps.  Regular contractions that start at 10 minutes apart and become stronger and more frequent with time.  Contractions that start on the top of the uterus and spread down to the lower abdomen and back.  Increased pelvic pressure and dull back pain.  A watery or bloody mucus discharge that comes from the vagina.  Leaking of amniotic fluid. This is also known as your "water breaking." It could be a slow trickle or a gush. Let your health care provider know if it has a color or strange odor. If you have any of these signs, call your health care provider right away, even if it is before your due date. Follow these instructions at home: Medicines  Follow your health care provider's instructions regarding medicine use. Specific medicines may be either safe or unsafe to take during pregnancy.  Take a prenatal vitamin that contains at least 600 micrograms (mcg) of folic acid.  If you develop constipation, try taking a stool softener if your health care provider approves. Eating and drinking  Eat a balanced diet that  includes fresh fruits and vegetables, whole grains, good sources of protein such as meat, eggs, or tofu, and low-fat dairy. Your health care provider will help you determine the amount of weight gain that is right for you.  Avoid raw meat and uncooked cheese. These carry germs that can cause birth defects in the baby.  If you have low calcium intake from food, talk to your health care provider about whether you should take a daily calcium supplement.  Eat four or five small meals rather than three large meals a day.  Limit foods that are high in fat and processed sugars, such as fried and sweet foods.  To prevent constipation: ? Drink enough fluid to keep your urine clear or pale yellow. ? Eat foods that are high in fiber, such as fresh fruits and vegetables, whole grains, and beans. Activity  Exercise only as directed by your health care provider. Most women can continue their usual exercise routine during pregnancy. Try to exercise for 30  minutes at least 5 days a week. Stop exercising if you experience uterine contractions.  Avoid heavy lifting.  Do not exercise in extreme heat or humidity, or at high altitudes.  Wear low-heel, comfortable shoes.  Practice good posture.  You may continue to have sex unless your health care provider tells you otherwise. Relieving pain and discomfort  Take frequent breaks and rest with your legs elevated if you have leg cramps or low back pain.  Take warm sitz baths to soothe any pain or discomfort caused by hemorrhoids. Use hemorrhoid cream if your health care provider approves.  Wear a good support bra to prevent discomfort from breast tenderness.  If you develop varicose veins: ? Wear support pantyhose or compression stockings as told by your healthcare provider. ? Elevate your feet for 15 minutes, 3-4 times a day. Prenatal care  Write down your questions. Take them to your prenatal visits.  Keep all your prenatal visits as told by your  health care provider. This is important. Safety  Wear your seat belt at all times when driving.  Make a list of emergency phone numbers, including numbers for family, friends, the hospital, and police and fire departments. General instructions  Avoid cat litter boxes and soil used by cats. These carry germs that can cause birth defects in the baby. If you have a cat, ask someone to clean the litter box for you.  Do not travel far distances unless it is absolutely necessary and only with the approval of your health care provider.  Do not use hot tubs, steam rooms, or saunas.  Do not drink alcohol.  Do not use any products that contain nicotine or tobacco, such as cigarettes and e-cigarettes. If you need help quitting, ask your health care provider.  Do not use any medicinal herbs or unprescribed drugs. These chemicals affect the formation and growth of the baby.  Do not douche or use tampons or scented sanitary pads.  Do not cross your legs for long periods of time.  To prepare for the arrival of your baby: ? Take prenatal classes to understand, practice, and ask questions about labor and delivery. ? Make a trial run to the hospital. ? Visit the hospital and tour the maternity area. ? Arrange for maternity or paternity leave through employers. ? Arrange for family and friends to take care of pets while you are in the hospital. ? Purchase a rear-facing car seat and make sure you know how to install it in your car. ? Pack your hospital bag. ? Prepare the baby's nursery. Make sure to remove all pillows and stuffed animals from the baby's crib to prevent suffocation.  Visit your dentist if you have not gone during your pregnancy. Use a soft toothbrush to brush your teeth and be gentle when you floss. Contact a health care provider if:  You are unsure if you are in labor or if your water has broken.  You become dizzy.  You have mild pelvic cramps, pelvic pressure, or nagging pain  in your abdominal area.  You have lower back pain.  You have persistent nausea, vomiting, or diarrhea.  You have an unusual or bad smelling vaginal discharge.  You have pain when you urinate. Get help right away if:  Your water breaks before 37 weeks.  You have regular contractions less than 5 minutes apart before 37 weeks.  You have a fever.  You are leaking fluid from your vagina.  You have spotting or bleeding from your vagina.  You have severe abdominal pain or cramping.  You have rapid weight loss or weight gain.  You have shortness of breath with chest pain.  You notice sudden or extreme swelling of your face, hands, ankles, feet, or legs.  Your baby makes fewer than 10 movements in 2 hours.  You have severe headaches that do not go away when you take medicine.  You have vision changes. Summary  The third trimester is from week 28 through week 40, months 7 through 9. The third trimester is a time when the unborn baby (fetus) is growing rapidly.  During the third trimester, your discomfort may increase as you and your baby continue to gain weight. You may have abdominal, leg, and back pain, sleeping problems, and an increased need to urinate.  During the third trimester your breasts will keep growing and they will continue to become tender. A yellow fluid (colostrum) may leak from your breasts. This is the first milk you are producing for your baby.  False labor is a condition in which you feel small, irregular tightenings of the muscles in the womb (contractions) that eventually go away. These are called Braxton Hicks contractions. Contractions may last for hours, days, or even weeks before true labor sets in.  Signs of labor can include: abdominal cramps; regular contractions that start at 10 minutes apart and become stronger and more frequent with time; watery or bloody mucus discharge that comes from the vagina; increased pelvic pressure and dull back pain; and  leaking of amniotic fluid. This information is not intended to replace advice given to you by your health care provider. Make sure you discuss any questions you have with your health care provider. Document Released: 09/13/2001 Document Revised: 02/25/2016 Document Reviewed: 11/20/2012 Elsevier Interactive Patient Education  2017 [employer].

## 2017-07-05 ENCOUNTER — Ambulatory Visit (INDEPENDENT_AMBULATORY_CARE_PROVIDER_SITE_OTHER): Payer: Self-pay | Admitting: *Deleted

## 2017-07-05 ENCOUNTER — Ambulatory Visit: Payer: Self-pay

## 2017-07-05 VITALS — BP 123/66 | HR 100

## 2017-07-05 DIAGNOSIS — O480 Post-term pregnancy: Secondary | ICD-10-CM

## 2017-07-05 NOTE — Progress Notes (Signed)

## 2017-07-10 ENCOUNTER — Encounter (HOSPITAL_COMMUNITY): Payer: Self-pay

## 2017-07-10 ENCOUNTER — Encounter (HOSPITAL_COMMUNITY): Payer: Self-pay | Admitting: Anesthesiology

## 2017-07-10 ENCOUNTER — Inpatient Hospital Stay (HOSPITAL_COMMUNITY)
Admission: RE | Admit: 2017-07-10 | Discharge: 2017-07-12 | DRG: 807 | Disposition: A | Discharge status: Home / Self Care | Payer: Medicaid Other | Source: Ambulatory Visit | Attending: Obstetrics and Gynecology | Admitting: Obstetrics and Gynecology

## 2017-07-10 DIAGNOSIS — O323XX0 Maternal care for face, brow and chin presentation, not applicable or unspecified: Secondary | ICD-10-CM | POA: Diagnosis present

## 2017-07-10 DIAGNOSIS — Z3A41 41 weeks gestation of pregnancy: Secondary | ICD-10-CM

## 2017-07-10 DIAGNOSIS — O99824 Streptococcus B carrier state complicating childbirth: Secondary | ICD-10-CM | POA: Diagnosis present

## 2017-07-10 DIAGNOSIS — D649 Anemia, unspecified: Secondary | ICD-10-CM | POA: Diagnosis present

## 2017-07-10 DIAGNOSIS — Z87891 Personal history of nicotine dependence: Secondary | ICD-10-CM | POA: Diagnosis not present

## 2017-07-10 DIAGNOSIS — O480 Post-term pregnancy: Secondary | ICD-10-CM | POA: Diagnosis present

## 2017-07-10 DIAGNOSIS — O9902 Anemia complicating childbirth: Secondary | ICD-10-CM | POA: Diagnosis present

## 2017-07-10 LAB — CBC
HCT: 32.8 % — ABNORMAL LOW (ref 36.0–46.0)
HEMOGLOBIN: 11.4 g/dL — AB (ref 12.0–15.0)
MCH: 30.6 pg (ref 26.0–34.0)
MCHC: 34.8 g/dL (ref 30.0–36.0)
MCV: 88.2 fL (ref 78.0–100.0)
PLATELETS: 215 10*3/uL (ref 150–400)
RBC: 3.72 MIL/uL — ABNORMAL LOW (ref 3.87–5.11)
RDW: 12.6 % (ref 11.5–15.5)
WBC: 10.4 10*3/uL (ref 4.0–10.5)

## 2017-07-10 LAB — TYPE AND SCREEN
ABO/RH(D): B POS
ANTIBODY SCREEN: NEGATIVE

## 2017-07-10 MED ORDER — ONDANSETRON HCL 4 MG PO TABS: 4.0000 mg | ORAL_TABLET | ORAL | Status: DC | PRN

## 2017-07-10 MED ORDER — ACETAMINOPHEN 325 MG PO TABS
650.0000 mg | ORAL_TABLET | ORAL | Status: DC | PRN
  Administered 2017-07-11: 650 mg via ORAL
  Filled 2017-07-10: qty 2

## 2017-07-10 MED ORDER — BUTORPHANOL TARTRATE 1 MG/ML IJ SOLN
INTRAMUSCULAR | Status: AC
  Filled 2017-07-10: qty 2

## 2017-07-10 MED ORDER — FENTANYL 2.5 MCG/ML BUPIVACAINE 1/10 % EPIDURAL INFUSION (WH - ANES)
14.0000 mL/h | INTRAMUSCULAR | Status: DC | PRN
  Filled 2017-07-10: qty 100

## 2017-07-10 MED ORDER — NALOXONE HCL 0.4 MG/ML IJ SOLN
INTRAMUSCULAR | Status: AC
  Filled 2017-07-10: qty 1

## 2017-07-10 MED ORDER — ONDANSETRON HCL 4 MG/2ML IJ SOLN: 4.0000 mg | Freq: Four times a day (QID) | INTRAMUSCULAR | Status: DC | PRN

## 2017-07-10 MED ORDER — DIPHENHYDRAMINE HCL 50 MG/ML IJ SOLN: 12.5000 mg | INTRAMUSCULAR | Status: DC | PRN

## 2017-07-10 MED ORDER — BUTORPHANOL TARTRATE 1 MG/ML IJ SOLN
2.0000 mg | Freq: Once | INTRAMUSCULAR | Status: AC
  Administered 2017-07-10: 2 mg via INTRAVENOUS

## 2017-07-10 MED ORDER — PENICILLIN G POT IN DEXTROSE 60000 UNIT/ML IV SOLN
3.0000 10*6.[IU] | INTRAVENOUS | Status: DC
  Administered 2017-07-10 (×3): 3 10*6.[IU] via INTRAVENOUS
  Filled 2017-07-10 (×4): qty 50

## 2017-07-10 MED ORDER — HYDROXYZINE HCL 50 MG PO TABS
50.0000 mg | ORAL_TABLET | Freq: Four times a day (QID) | ORAL | Status: DC | PRN
  Filled 2017-07-10: qty 1

## 2017-07-10 MED ORDER — PHENYLEPHRINE 40 MCG/ML (10ML) SYRINGE FOR IV PUSH (FOR BLOOD PRESSURE SUPPORT)
80.0000 ug | PREFILLED_SYRINGE | INTRAVENOUS | Status: DC | PRN
  Filled 2017-07-10: qty 5

## 2017-07-10 MED ORDER — LACTATED RINGERS IV SOLN: 500.0000 mL | Freq: Once | INTRAVENOUS | Status: DC

## 2017-07-10 MED ORDER — DIBUCAINE 1 % RE OINT: 1.0000 "application " | TOPICAL_OINTMENT | RECTAL | Status: DC | PRN

## 2017-07-10 MED ORDER — SIMETHICONE 80 MG PO CHEW: 80.0000 mg | CHEWABLE_TABLET | ORAL | Status: DC | PRN

## 2017-07-10 MED ORDER — OXYCODONE-ACETAMINOPHEN 5-325 MG PO TABS: 2.0000 | ORAL_TABLET | ORAL | Status: DC | PRN

## 2017-07-10 MED ORDER — LACTATED RINGERS IV SOLN
INTRAVENOUS | Status: DC
  Administered 2017-07-10 (×3): via INTRAVENOUS

## 2017-07-10 MED ORDER — METHYLERGONOVINE MALEATE 0.2 MG/ML IJ SOLN
0.2000 mg | Freq: Once | INTRAMUSCULAR | Status: AC
  Administered 2017-07-10: 0.2 mg via INTRAMUSCULAR

## 2017-07-10 MED ORDER — IBUPROFEN 600 MG PO TABS
600.0000 mg | ORAL_TABLET | Freq: Four times a day (QID) | ORAL | Status: DC
  Administered 2017-07-11 – 2017-07-12 (×6): 600 mg via ORAL
  Filled 2017-07-10 (×6): qty 1

## 2017-07-10 MED ORDER — OXYTOCIN BOLUS FROM INFUSION
500.0000 mL | Freq: Once | INTRAVENOUS | Status: AC
  Administered 2017-07-10: 500 mL via INTRAVENOUS

## 2017-07-10 MED ORDER — TERBUTALINE SULFATE 1 MG/ML IJ SOLN
0.2500 mg | Freq: Once | INTRAMUSCULAR | Status: DC | PRN
  Filled 2017-07-10: qty 1

## 2017-07-10 MED ORDER — PHENYLEPHRINE 40 MCG/ML (10ML) SYRINGE FOR IV PUSH (FOR BLOOD PRESSURE SUPPORT)
80.0000 ug | PREFILLED_SYRINGE | INTRAVENOUS | Status: DC | PRN
  Filled 2017-07-10: qty 10
  Filled 2017-07-10: qty 5

## 2017-07-10 MED ORDER — NALBUPHINE HCL 10 MG/ML IJ SOLN: 5.0000 mg | INTRAMUSCULAR | Status: DC | PRN

## 2017-07-10 MED ORDER — PRENATAL MULTIVITAMIN CH
1.0000 | ORAL_TABLET | Freq: Every day | ORAL | Status: DC
  Administered 2017-07-11: 1 via ORAL
  Filled 2017-07-10: qty 1

## 2017-07-10 MED ORDER — OXYTOCIN 40 UNITS IN LACTATED RINGERS INFUSION - SIMPLE MED
2.5000 [IU]/h | INTRAVENOUS | Status: DC
  Administered 2017-07-10: 2.5 [IU]/h via INTRAVENOUS
  Filled 2017-07-10: qty 1000

## 2017-07-10 MED ORDER — DIPHENHYDRAMINE HCL 25 MG PO CAPS: 25.0000 mg | ORAL_CAPSULE | Freq: Four times a day (QID) | ORAL | Status: DC | PRN

## 2017-07-10 MED ORDER — ACETAMINOPHEN 325 MG PO TABS: 650.0000 mg | ORAL_TABLET | ORAL | Status: DC | PRN

## 2017-07-10 MED ORDER — OXYCODONE-ACETAMINOPHEN 5-325 MG PO TABS: 1.0000 | ORAL_TABLET | ORAL | Status: DC | PRN

## 2017-07-10 MED ORDER — ZOLPIDEM TARTRATE 5 MG PO TABS: 5.0000 mg | ORAL_TABLET | Freq: Every evening | ORAL | Status: DC | PRN

## 2017-07-10 MED ORDER — MISOPROSTOL 200 MCG PO TABS: 50.0000 ug | ORAL_TABLET | ORAL | Status: DC | PRN

## 2017-07-10 MED ORDER — FENTANYL CITRATE (PF) 100 MCG/2ML IJ SOLN: 100.0000 ug | INTRAMUSCULAR | Status: DC | PRN

## 2017-07-10 MED ORDER — TETANUS-DIPHTH-ACELL PERTUSSIS 5-2.5-18.5 LF-MCG/0.5 IM SUSP: 0.5000 mL | Freq: Once | INTRAMUSCULAR | Status: DC

## 2017-07-10 MED ORDER — SOD CITRATE-CITRIC ACID 500-334 MG/5ML PO SOLN: 30.0000 mL | ORAL | Status: DC | PRN

## 2017-07-10 MED ORDER — WITCH HAZEL-GLYCERIN EX PADS: 1.0000 "application " | MEDICATED_PAD | CUTANEOUS | Status: DC | PRN

## 2017-07-10 MED ORDER — DEXTROSE 5 % IV SOLN
5.0000 10*6.[IU] | Freq: Once | INTRAVENOUS | Status: AC
  Administered 2017-07-10: 5 10*6.[IU] via INTRAVENOUS
  Filled 2017-07-10: qty 5

## 2017-07-10 MED ORDER — OXYTOCIN 40 UNITS IN LACTATED RINGERS INFUSION - SIMPLE MED
1.0000 m[IU]/min | INTRAVENOUS | Status: DC
  Administered 2017-07-10: 2 m[IU]/min via INTRAVENOUS

## 2017-07-10 MED ORDER — COCONUT OIL OIL
1.0000 "application " | TOPICAL_OIL | Status: DC | PRN
  Filled 2017-07-10: qty 120

## 2017-07-10 MED ORDER — SENNOSIDES-DOCUSATE SODIUM 8.6-50 MG PO TABS
2.0000 | ORAL_TABLET | ORAL | Status: DC
  Administered 2017-07-11 (×2): 2 via ORAL
  Filled 2017-07-10 (×2): qty 2

## 2017-07-10 MED ORDER — LACTATED RINGERS IV SOLN
500.0000 mL | INTRAVENOUS | Status: DC | PRN
  Administered 2017-07-10 (×3): 500 mL via INTRAVENOUS

## 2017-07-10 MED ORDER — EPHEDRINE 5 MG/ML INJ
10.0000 mg | INTRAVENOUS | Status: DC | PRN
  Filled 2017-07-10: qty 2

## 2017-07-10 MED ORDER — ONDANSETRON HCL 4 MG/2ML IJ SOLN
4.0000 mg | INTRAMUSCULAR | Status: DC | PRN
Start: 2017-07-10 — End: 2017-07-12

## 2017-07-10 MED ORDER — BENZOCAINE-MENTHOL 20-0.5 % EX AERO
1.0000 "application " | INHALATION_SPRAY | CUTANEOUS | Status: DC | PRN
  Administered 2017-07-11: 1 via TOPICAL
  Filled 2017-07-10: qty 56

## 2017-07-10 MED ORDER — LIDOCAINE HCL (PF) 1 % IJ SOLN
30.0000 mL | INTRAMUSCULAR | Status: DC | PRN
  Filled 2017-07-10: qty 30

## 2017-07-10 NOTE — Progress Notes (Signed)
Natalie Riggs is a 26 y.o. [phone] at [redacted]w[redacted]d by ultrasound admitted for induction of labor due to Post dates. Due date 07/03/17.  Subjective: Patient doing well, minimal abdominal discomfort with mild contractions, no LOF, no vaginal bleeding.   Objective: BP 126/76   Pulse 83   Temp 97.7 F (36.5 C) (Oral)   Resp 18   Ht  (1.702 m)   Wt 78.5 kg (173 lb)   LMP 07/30/2016 (Exact Date)   BMI 27.10 kg/m  No intake/output data recorded. No intake/output data recorded.  FHT:  FHR: 125 bpm, variability: 6-25 BPM,  accelerations:  Present,  decelerations:  Absent UC:   regular, every 1-5 minutes SVE:   Dilation: 4 Effacement (%): 70 Station: -2 Exam by:: Paulina Fusi CNM  Labs: Lab Results  Component Value Date   WBC 10.4 07/10/2017   HGB 11.4 (L) 07/10/2017   HCT 32.8 (L) 07/10/2017   MCV 88.2 07/10/2017   PLT 215 07/10/2017    Assessment / Plan: Induction of labor due to postterm,  progressing well on pitocin 2x2 (94ml/hr  hours)  Labor: Progressing on pitocin Preeclampsia:  no signs or symptoms of toxicity Fetal Wellbeing:  Category I Pain Control:  IV pain meds I/D:  GBS positive Anticipated MOD:  Vaginal delivery  Dyke Maes Cimolino, PA-S 07/10/2017, 2:35 PM   I confirm that I have verified the information documented in the physician assistant's note and that I have also personally reperformed the physical exam and all medical decision making activities.   Luna Kitchens CNM

## 2017-07-10 NOTE — H&P (Signed)
OBSTETRIC ADMISSION HISTORY AND PHYSICAL  Natalie Riggs is a 26 y.o. female [phone] with IUP at [redacted]w[redacted]d by early U/S presenting for IOL for postdates. She reports +FMs, No LOF, no VB, no blurry vision, headaches or peripheral edema, and RUQ pain.  She plans on bottle feeding. She request depo for birth control. She received her prenatal care at [facility]   Dating: By early U/S (02/10/17) --->  Estimated Date of Delivery: 07/03/17  Sono:    , CWD, normal anatomy, cephalic presentation, placenta anterior above os,    288g, 43% EFW   Prenatal History/Complications:  Past Medical History: Past Medical History:  Diagnosis Date  . Miscarriage     Past Surgical History: Past Surgical History:  Procedure Laterality Date  . DILATATION & CURETTAGE/HYSTEROSCOPY WITH MYOSURE      Obstetrical History: OB History    Gravida Para Term Preterm AB Living   SAB TAB Ectopic Multiple Live Births   1       2      Social History: Social History   Social History  . Marital status: Single    Spouse name: N/A  . Number of children: N/A  . Years of education: N/A   Social History Main Topics  . Smoking status: Former Smoker    Packs/day: 0.00  . Smokeless tobacco: Never Used  . Alcohol use No  . Drug use: No  . Sexual activity: Yes    Birth control/ protection: None   Other Topics Concern  . None   Social History Narrative  . None    Family History: Family History  Problem Relation Age of Onset  . Asthma Brother     Allergies: No Known Allergies  Prescriptions Prior to Admission  Medication Sig Dispense Refill Last Dose  . Prenatal Vit-Fe Fumarate-FA (PRENATAL MULTIVITAMIN) TABS tablet Take 1 tablet by mouth daily at 12 noon. (Patient not taking: Reported on 06/26/2017) 30 tablet 12 Not Taking     Review of Systems   All systems reviewed and negative except as stated in HPI  Blood pressure 109/64, pulse 92, temperature 97.6 F (36.4 C),  temperature source Oral, resp. rate 18, height  (1.702 m), weight 78.5 kg (173 lb), last menstrual period 07/30/2016. General appearance: alert, cooperative, appears stated age and no distress Lungs: clear to auscultation bilaterally Heart: regular rate and rhythm Abdomen: soft, non-tender; bowel sounds normal Pelvic: deferred  Extremities: Homans sign is negative, no sign of DVT DTR's 2+ Presentation: cephalic Fetal monitoringBaseline: 125 bpm, Variability: Good {> 6 bpm), Accelerations: Reactive and Decelerations: Variable: occasional, non-repetitive variable with quick descent and return to baseline.  Uterine activityFrequency: Every 3-8 minutes, Duration: 60-90 seconds and Intensity: mild Dilation: 3 Effacement (%): 50 Station: -2 Exam by:: Lorretta Harp RNC   Prenatal labs: ABO, Rh: B/Positive/-- (04/24 0858) Antibody: Negative (04/24 0858) Rubella: @ RPR: Non Reactive (07/10 0920)  HBsAg: Negative (04/24 0858)  HIV:   negative GBS:   present in urine 1 hr Glucola 101 (normal) Genetic screening  Primary too late, quad negative  Anatomy US normal anatomy, female   Prenatal Transfer Tool  Maternal Diabetes: No Genetic Screening: primary too late, quad negative Maternal Ultrasounds/Referrals: Normal Fetal Ultrasounds or other Referrals:  None Maternal Substance Abuse:  No Significant Maternal Medications:  Meds include: Other: terbutaline 0.25 mg Galt, PRN for tachystole with non-reassuring FHR Significant Maternal Lab Results: Lab values include: Group B Strep positive  Results for orders placed or performed during the hospital encounter of 07/10/17 (from the past 24 hour(s))  CBC   Collection Time: 07/10/17  7:24 AM  Result Value Ref Range   WBC 10.4 4.0 - 10.5 K/uL   RBC 3.72 (L) 3.87 - 5.11 MIL/uL   Hemoglobin 11.4 (L) 12.0 - 15.0 g/dL   HCT 16.1 (L) 09.6 - 04.5 %   MCV 88.2 78.0 - 100.0 fL   MCH 30.6 26.0 - 34.0 pg   MCHC 34.8 30.0 - 36.0  g/dL   RDW 40.9 81.1 - 91.4 %   Platelets 215 150 - 400 K/uL    Patient Active Problem List   Diagnosis Date Noted  . Post-dates pregnancy 07/10/2017  . Electrolyte disturbance 05/03/2017  . Anemia of pregnancy 05/03/2017  . GBS bacteriuria 01/24/2017  . Supervision of other normal pregnancy, antepartum 01/24/2017  . Hyperemesis affecting pregnancy, antepartum 12/25/2016    Assessment/Plan:  Natalie Riggs is a 26 y.o. [phone] at [redacted]w[redacted]d here for IOL for post dates  #Labor: patient started on Pitocin 2x2 at 0730 hours #Pain: IV pain meds #FWB: Category 1 #ID:  GBS + #MOF: bottle #MOC: depo #Circ:  N/A  Dyke Maes Cimolino, Student-PA  07/10/2017, 9:09 AM  I confirm that I have verified the information documented in the physician assistant's note and that I have also personally reperformed the physical exam and all medical decision making activities.   Luna Kitchens CNM

## 2017-07-10 NOTE — Progress Notes (Addendum)
   SHAUNA BODKINS is a 26 y.o. [phone] at [redacted]w[redacted]d  admitted for induction of labor due to Post dates. Due date 07-03-2017.  Subjective:  Patient coping well without epidural.  Objective: Vitals:   07/10/17 1300 07/10/17 1330 07/10/17 1400 07/10/17 1430  BP: 114/73 117/74 (!) 131/96 126/76  Pulse: 82 95 78 83  Resp: 18  18   Temp:      TempSrc:      Weight:      Height:       No intake/output data recorded.  FHT:  FHR: 130 bpm, variability: moderate,  accelerations:  Present,  decelerations:  Absent UC:   irregular, every 4 minutes SVE:   Dilation: 4 Effacement (%): 70 Station: -2 Exam by:: K. Deyanna Mctier CNM Pitocin @ 4 mu/min  Labs: Lab Results  Component Value Date   WBC 10.4 07/10/2017   HGB 11.4 (L) 07/10/2017   HCT 32.8 (L) 07/10/2017   MCV 88.2 07/10/2017   PLT 215 07/10/2017    Assessment / Plan: Induction of labor due to postterm,  progressing well on pitocin Consider AROM Labor: Progressing normally Fetal Wellbeing:  Category I Pain Control:  Labor support without medications Anticipated MOD:  NSVD  [name] Shajuan Musso 07/10/2017, 2:32 PM

## 2017-07-10 NOTE — Progress Notes (Signed)
Called back into the room d/t persistent bleeding with fundal massage noted by RN with a few clots expressed. Arrived in room, pt stable, NAD, VSS. Fundus firm, but steady trickling of blood with fundal massage. Methergine x 1 given, and clots cleared form lower uterine segment. Bleeding controlled. VSS. Will continue to monitor.  Raynelle Fanning P. Degele, MD OB Fellow

## 2017-07-10 NOTE — Progress Notes (Signed)
   Natalie Riggs is a 26 y.o. [phone] at [redacted]w[redacted]d  admitted for induction of labor due to Post dates. Due date 07/03/2017.  Subjective: Patient resting well, comfortable.   Objective: Vitals:   07/10/17 0830 07/10/17 0900 07/10/17 0930 07/10/17 1000  BP: 109/64 (!) 116/53 108/67 108/64  Pulse: 92 78 82 92  Resp:      Temp:      TempSrc:      Weight:      Height:       No intake/output data recorded.  FHT:  FHR: 150 bpm, variability: moderate,  accelerations:  Present,  decelerations:  Present occasional variables UC:   irregular, every 4 minutes SVE:   Dilation: 3 Effacement (%): 50 Station: -2 Exam by:: Lorretta Harp RNC Pitocin @ 4 mu/min  Labs: Lab Results  Component Value Date   WBC 10.4 07/10/2017   HGB 11.4 (L) 07/10/2017   HCT 32.8 (L) 07/10/2017   MCV 88.2 07/10/2017   PLT 215 07/10/2017    Assessment / Plan: early labor  Labor: Progressing on Pitocin, will continue to increase then AROM Fetal Wellbeing:  Category I Pain Control:  Labor support without medications Anticipated MOD:  NSVD  [name] Kooistra 07/10/2017, 10:15 AM

## 2017-07-10 NOTE — Progress Notes (Signed)
Natalie Riggs is a 26 y.o. [phone] at [redacted]w[redacted]d by ultrasound admitted for induction of labor due to Post dates. Due date 07/03/17.  Subjective: Patient doing well, starting to have more intense contractions, admits to moderate abdominal discomfort, requesting something for pain. No spontaneous ROM reported  Objective: BP (!) 100/58   Pulse 73   Temp 98 F (36.7 C) (Oral)   Resp 16   Ht  (1.702 m)   Wt 78.5 kg (173 lb)   LMP 07/30/2016 (Exact Date)   BMI 27.10 kg/m  No intake/output data recorded. No intake/output data recorded.  FHT:  FHR: 125 bpm, variability: 6-25 BPM,  accelerations:  Present,  decelerations:  Present variable and   UC:   regular, every 1-5 minutes SVE:   Dilation: 5 Effacement (%): 80 Station: -2 Exam by:: K. Fannie Alomar CNM   Labs: Lab Results  Component Value Date   WBC 10.4 07/10/2017   HGB 11.4 (L) 07/10/2017   HCT 32.8 (L) 07/10/2017   MCV 88.2 07/10/2017   PLT 215 07/10/2017    Assessment / Plan: Induction of labor due to postterm,  progressing well on pitocin 2x2 (31ml/hr  hours) Patient now on 36ml/hr   Labor: Progressing on pitocin Preeclampsia:  no signs or symptoms of toxicity Fetal Wellbeing:  Category II Pain Control:  IV pain meds I/D:  GBS positive Anticipated MOD:  Vaginal delivery  Dyke Maes Cimolino, PA-S 07/10/2017, 7:58 PM   I confirm that I have verified the information documented in the physician assistant's note and that I have also personally reperformed the physical exam and all medical decision making activities.   Luna Kitchens CNM

## 2017-07-10 NOTE — Anesthesia Pain Management Evaluation Note (Signed)
  CRNA Pain Management Visit Note  Patient: Natalie Riggs, 25 y.o., female  "Hello I am a member of the anesthesia team at [facility]. We have an anesthesia team available at all times to provide care throughout the hospital, including epidural management and anesthesia for C-section. I don't know your plan for the delivery whether it a natural birth, water birth, IV sedation, nitrous supplementation, doula or epidural, but we want to meet your pain goals."   1.Was your pain managed to your expectations on prior hospitalizations?   Yes   2.What is your expectation for pain management during this hospitalization?     IV pain meds  3.How can we help you reach that goal?   Record the patient's initial score and the patient's pain goal.   Pain: 0  Pain Goal: 7 [facility] wants you to be able to say your pain was always managed very well.  Shanequa Whitenight Hristova 07/10/2017

## 2017-07-11 LAB — RPR: RPR: NONREACTIVE

## 2017-07-11 NOTE — Progress Notes (Signed)
Post Partum Day 1 Subjective: no complaints, up ad lib, voiding, tolerating PO and + flatus  Objective: Blood pressure 119/61, pulse 75, temperature 98.6 F (37 C), temperature source Oral, resp. rate 18, height  (1.702 m), weight 173 lb (78.5 kg), last menstrual period 07/30/2016, SpO2 98 %.  Physical Exam:  General: alert, cooperative and no distress Lochia: appropriate Uterine Fundus: firm Incision: n/a DVT Evaluation: No evidence of DVT seen on physical exam. No cords or calf tenderness.   Recent Labs  07/10/17 0724  HGB 11.4*  HCT 32.8*    Assessment/Plan: Plan for discharge tomorrow, Breastfeeding and Contraception Depo   LOS: 1 day   Arlyce Harman 07/11/2017, 7:55 AM   OB FELLOW POSTPARTUM PROGRESS NOTE ATTESTATION  I have seen and examined this patient and agree with above documentation in the resident's note.   Frederik Pear, MD OB Fellow 9:21 AM

## 2017-07-11 NOTE — Lactation Note (Signed)
This note was copied from a baby's chart. Lactation Consultation Note  Patient Name: Natalie Riggs ZOXWR'U Date: 07/11/2017 Reason for consult: Initial assessment;Term  Attempted to visit Mom, baby 81 hrs old.  Mom sleeping, baby sleeping in crib, swaddled.  Mom choosing to bottle feed baby formula, 6 times since birth.  Mom aware of lactation assistance.  Lactation brochure left in room.  Discussed with Mom's RN about asking if she would like breastfeeding assistance.  RN will call LC.   Consult Status Consult Status: Follow-up Date: 07/12/17 Follow-up type: In-patient    Natalie Riggs 07/11/2017, 3:55 PM

## 2017-07-12 MED ORDER — IBUPROFEN 600 MG PO TABS: 600.0000 mg | ORAL_TABLET | Freq: Four times a day (QID) | ORAL | 0 refills | Status: DC

## 2017-07-12 NOTE — Lactation Note (Signed)
This note was copied from a baby's chart. Lactation Consultation Note  Patient Name: Natalie Riggs ZOXWR'U Date: 07/12/2017 Reason for consult: Follow-up assessment Baby at 38 hr of life and dyad set for d/c today. Mom has been formula feeding but thinks she might like to bf. She plans to go back to work "I don't want to start something I can not finish". She stated she was open to latching the baby but has not "done it yet". Offered latch help at the next feeding and she stated "I will try, but I will call if I need yall". Discussed the possibility of pumping to feed if dyad has trouble with latching. Encouraged mom that any amount of breast milk would be good for baby, she can bf and offer formula. Reviewed upcoming breast changes. She is aware of engorgement tx/prevention along with comfort measures during evolution if she chooses not to bf. She is aware of lactation services and support group. She will call as needed.    Maternal Data Has patient been taught Hand Expression?: Yes  Feeding    LATCH Score                   Interventions    Lactation Tools Discussed/Used WIC Program: Yes   Consult Status Consult Status: Complete Follow-up type: Call as needed    Rulon Eisenmenger 07/12/2017, 12:08 PM

## 2017-07-12 NOTE — Discharge Instructions (Signed)

## 2017-07-12 NOTE — Discharge Summary (Signed)
OB Discharge Summary     Patient Name: Natalie Riggs DOB: May 25, 1991 MRN: [id number]  Date of admission: 07/10/2017 Delivering MD: Frederik Pear   Date of discharge: 07/12/2017  Admitting diagnosis: INDUCTION Intrauterine pregnancy: [redacted]w[redacted]d     Secondary diagnosis:  Active Problems:   Post-dates pregnancy  Additional problems: None     Discharge diagnosis: Term Pregnancy Delivered                                                                                                Post partum procedures:NA  Augmentation: AROM and Pitocin  Complications: None  Hospital course:  Induction of Labor With Vaginal Delivery   26 y.o. yo [id number] at [redacted]w[redacted]d was admitted to the hospital 07/10/2017 for induction of labor.  Indication for induction: Postdates.  Patient had an uncomplicated labor course as follows: Membrane Rupture Time/Date: 9:00 PM ,07/10/2017   Intrapartum Procedures: Episiotomy: None [1]                                         Lacerations:  None [1]  Patient had delivery of a Viable infant.  Information for the patient's newborn:  Danajah, Birdsell [[id number]]  Delivery Method: Vaginal, Spontaneous Delivery (Filed from Delivery Summary)   07/10/2017  Details of delivery can be found in separate delivery note.  Patient had a routine postpartum course. Patient is discharged home 07/12/17.  Physical exam  Vitals:   07/11/17 0010 07/11/17 0410 07/11/17 1746 07/12/17 0529  BP: 123/76 119/61 111/66 104/63  Pulse: 68 75 83 79  Resp: Temp: (!) 97.5 F (36.4 C) 98.6 F (37 C) 98.1 F (36.7 C) 98.2 F (36.8 C)  TempSrc: Axillary Oral Oral Oral  SpO2:      Weight:      Height:       General: alert Lochia: appropriate Uterine Fundus: firm Incision: N/A DVT Evaluation: No evidence of DVT seen on physical exam. Labs: Lab Results  Component Value Date   WBC 10.4 07/10/2017   HGB 11.4 (L) 07/10/2017   HCT 32.8 (L) 07/10/2017   MCV 88.2 07/10/2017   PLT  215 07/10/2017   CMP Latest Ref Rng & Units 05/05/2017  Glucose 65 - 99 mg/dL 79  BUN 6 - 20 mg/dL <1(H)  Creatinine 0.86 - 1.00 mg/dL 5.78  Sodium 469 - 629 mmol/L 136  Potassium 3.5 - 5.1 mmol/L 3.3(L)  Chloride 101 - 111 mmol/L 113(H)  CO2 22 - 32 mmol/L 21(L)  Calcium 8.9 - 10.3 mg/dL 7.9(L)  Total Protein 6.5 - 8.1 g/dL 5.2(W)  Total Bilirubin 0.3 - 1.2 mg/dL 0.5  Alkaline Phos 38 - 126 U/L 55  AST 15 - 41 U/L 17  ALT 14 - 54 U/L 12(L)    Discharge instruction: per After Visit Summary and "Baby and Me Booklet".  After visit meds:  Allergies as of 07/12/2017   No Known Allergies     Medication List    TAKE  these medications   ibuprofen 600 MG tablet Commonly known as:  ADVIL,MOTRIN Take 1 tablet (600 mg total) by mouth every 6 (six) hours.   prenatal multivitamin Tabs tablet Take 1 tablet by mouth daily at 12 noon.       Diet: routine diet  Activity: Advance as tolerated. Pelvic rest for 6 weeks.   Outpatient follow up:6 weeks Follow up Appt: Future Appointments Date Time Provider Department Center  08/16/2017 10:00 AM Marny Lowenstein, PA-C [facility]   Follow up Visit:No Follow-up on file.  Postpartum contraception: Depo Provera  Newborn Data: Live born female  Birth Weight: 6 lb 8.9 oz (2974 g) APGAR: 9, 9  Newborn Delivery   Birth date/time:  07/10/2017 21:16:00 Delivery type:  Vaginal, Spontaneous Delivery      Baby Feeding: Bottle Disposition:home with mother   07/12/2017  Renne Musca, MD

## 2017-08-16 ENCOUNTER — Ambulatory Visit: Payer: Medicaid Other | Admitting: Medical

## 2017-08-23 ENCOUNTER — Telehealth: Payer: Self-pay

## 2017-08-23 ENCOUNTER — Ambulatory Visit: Payer: Medicaid Other | Admitting: Advanced Practice Midwife

## 2017-08-23 NOTE — Telephone Encounter (Signed)
Spoke with pt in regards to missed appt.  Pt stated that she will contact the office after the holidays to reschedule her appt.  Pt denies any concerns or issues at this time.

## 2017-09-25 ENCOUNTER — Encounter (HOSPITAL_COMMUNITY): Payer: Self-pay

## 2017-10-03 NOTE — L&D Delivery Note (Signed)
Delivery Note  Called to [facility] ER for term pregnancy with advanced dilation. Arrived and patient with urge to push. AROM performed   At 8:11 AM a viable female was delivered via Vaginal, Spontaneous (Presentation: OA). Shoulder delivered easily APGAR: 8, 9; weight 6 lb 13.5 oz (3105 g).   Placenta status: Spontaneous, intact. 3VC  with the following complications: .  Cord pH: not collected  Anesthesia:  None Episiotomy: None Lacerations: Periurethral Suture Repair: NA Est. Blood Loss (mL): 100  Mom to postpartum.  Baby to Couplet care / Skin to Skin.  Isa RankinKimberly Niles [facility] 06/20/2018, 12:00 PM

## 2017-10-31 ENCOUNTER — Emergency Department (HOSPITAL_COMMUNITY): Payer: Medicaid Other

## 2017-10-31 ENCOUNTER — Other Ambulatory Visit: Payer: Self-pay

## 2017-10-31 ENCOUNTER — Emergency Department (HOSPITAL_COMMUNITY)
Admission: EM | Admit: 2017-10-31 | Discharge: 2017-11-01 | Disposition: A | Discharge status: Home / Self Care | Payer: Medicaid Other | Attending: Emergency Medicine | Admitting: Emergency Medicine

## 2017-10-31 ENCOUNTER — Encounter (HOSPITAL_COMMUNITY): Payer: Self-pay | Admitting: Emergency Medicine

## 2017-10-31 DIAGNOSIS — Z79899 Other long term (current) drug therapy: Secondary | ICD-10-CM | POA: Diagnosis not present

## 2017-10-31 DIAGNOSIS — O2311 Infections of bladder in pregnancy, first trimester: Secondary | ICD-10-CM | POA: Diagnosis not present

## 2017-10-31 DIAGNOSIS — Z3A01 Less than 8 weeks gestation of pregnancy: Secondary | ICD-10-CM | POA: Diagnosis not present

## 2017-10-31 DIAGNOSIS — O219 Vomiting of pregnancy, unspecified: Secondary | ICD-10-CM

## 2017-10-31 DIAGNOSIS — N3000 Acute cystitis without hematuria: Secondary | ICD-10-CM

## 2017-10-31 LAB — URINALYSIS, ROUTINE W REFLEX MICROSCOPIC
Bilirubin Urine: NEGATIVE
Glucose, UA: 50 mg/dL — AB
HGB URINE DIPSTICK: NEGATIVE
KETONES UR: 80 mg/dL — AB
NITRITE: NEGATIVE
PROTEIN: 30 mg/dL — AB
Specific Gravity, Urine: 1.03 (ref 1.005–1.030)
pH: 6 (ref 5.0–8.0)

## 2017-10-31 LAB — LIPASE, BLOOD: Lipase: 26 U/L (ref 11–51)

## 2017-10-31 LAB — COMPREHENSIVE METABOLIC PANEL
ALBUMIN: 4.4 g/dL (ref 3.5–5.0)
ALT: 19 U/L (ref 14–54)
AST: 24 U/L (ref 15–41)
Alkaline Phosphatase: 73 U/L (ref 38–126)
Anion gap: 13 (ref 5–15)
BUN: 10 mg/dL (ref 6–20)
CALCIUM: 9.5 mg/dL (ref 8.9–10.3)
CHLORIDE: 107 mmol/L (ref 101–111)
CO2: 15 mmol/L — AB (ref 22–32)
CREATININE: 0.64 mg/dL (ref 0.44–1.00)
GFR calc non Af Amer: >60 mL/min (ref >60)
GLUCOSE: 109 mg/dL — AB (ref 65–99)
Potassium: 3.4 mmol/L — ABNORMAL LOW (ref 3.5–5.1)
SODIUM: 135 mmol/L (ref 135–145)
Total Bilirubin: 0.8 mg/dL (ref 0.3–1.2)
Total Protein: 8.4 g/dL — ABNORMAL HIGH (ref 6.5–8.1)

## 2017-10-31 LAB — I-STAT BETA HCG BLOOD, ED (MC, WL, AP ONLY)

## 2017-10-31 LAB — CBC
HCT: 38.3 % (ref 36.0–46.0)
Hemoglobin: 13.6 g/dL (ref 12.0–15.0)
MCH: 30.2 pg (ref 26.0–34.0)
MCHC: 35.5 g/dL (ref 30.0–36.0)
MCV: 84.9 fL (ref 78.0–100.0)
Platelets: 226 10*3/uL (ref 150–400)
RBC: 4.51 MIL/uL (ref 3.87–5.11)
RDW: 12.4 % (ref 11.5–15.5)
WBC: 9.2 10*3/uL (ref 4.0–10.5)

## 2017-10-31 LAB — HCG, QUANTITATIVE, PREGNANCY: hCG, Beta Chain, Quant, S: 115987 m[IU]/mL — ABNORMAL HIGH (ref <5)

## 2017-10-31 LAB — INFLUENZA PANEL BY PCR (TYPE A & B)
INFLAPCR: NEGATIVE
Influenza B By PCR: NEGATIVE

## 2017-10-31 MED ORDER — SODIUM CHLORIDE 0.9 % IV BOLUS (SEPSIS)
1000.0000 mL | Freq: Once | INTRAVENOUS | Status: AC
  Administered 2017-10-31: 1000 mL via INTRAVENOUS

## 2017-10-31 MED ORDER — ONDANSETRON 4 MG PO TBDP
4.0000 mg | ORAL_TABLET | Freq: Once | ORAL | Status: AC | PRN
  Administered 2017-10-31: 4 mg via ORAL
  Filled 2017-10-31: qty 1

## 2017-10-31 MED ORDER — METOCLOPRAMIDE HCL 5 MG/ML IJ SOLN
10.0000 mg | Freq: Once | INTRAMUSCULAR | Status: AC
  Administered 2017-10-31: 10 mg via INTRAVENOUS
  Filled 2017-10-31: qty 2

## 2017-10-31 MED ORDER — ONDANSETRON HCL 4 MG/2ML IJ SOLN
4.0000 mg | Freq: Once | INTRAMUSCULAR | Status: AC
  Administered 2017-10-31: 4 mg via INTRAVENOUS
  Filled 2017-10-31: qty 2

## 2017-10-31 MED ORDER — ACETAMINOPHEN 325 MG PO TABS
650.0000 mg | ORAL_TABLET | Freq: Once | ORAL | Status: AC
Start: 2017-10-31 — End: 2017-10-31
  Administered 2017-10-31: 650 mg via ORAL
  Filled 2017-10-31: qty 2

## 2017-10-31 NOTE — ED Notes (Signed)
Advised pt that we need a urine specimen.  Pt st's she needs time.  Went back into room to find pt standing at bedside urinating in a styrofoam cup.  Pt st's she couldn't make it to bathroom

## 2017-10-31 NOTE — ED Provider Notes (Signed)
Patient signed out to me by Marigene EhlersM McDonald, PA-C.  Please see previous notes for further history.  In brief, patient developed nausea, vomiting, diarrhea which began yesterday.  She has associated myalgias, productive cough, and dyspnea.  She reports she has not been able to tolerate p.o. for the past 2 days.  Physical exam shows she has generalized abdominal pain, increased tenderness suprapubically.  She appears uncomfortable due to pain and dehydration.  Patient gave birth on October 8.  Today, quant of 115000.  Bicarb of 15, likely due to vomiting. CXR, US, Flu and UA pending. IVF and zofran given, pt reports improvement with zofran. Case discussed with Dr. Erma HeritageIsaacs, who agrees to plan.   US shows single IUP with cardiac visualization. CXR negative for PNA or mass. Pt encouraged to give urine sample. On reassessment, pt states she is not feeling any better, and still feels nauseous. Will give reglan and 2nd bolus.   Pt reports improved with reglan. UA +leuks and bacteria. Will tx for possible UTI, as she is pregnant. Flu negative. Pt tolerating PO. Will d/c with reglan and abx. Follow up with OB/GYN. Encouraged hydration. At this time, pt appears safe for discharge. Return precautions given. Pt states she understands and agrees to plan.    Alveria ApleyCaccavale, [place] Yarbro, PA-C 11/01/17 [id number]    Linwood DibblesKnapp, Jon, MD 11/01/17 [phone]

## 2017-10-31 NOTE — ED Provider Notes (Signed)
[facility] EMERGENCY DEPARTMENT Provider Note   CSN: [id number] Arrival date & time: 10/31/17  1557     History   Chief Complaint Chief Complaint  Patient presents with  . Abdominal Pain  . Emesis  . Diarrhea    HPI Natalie Riggs is a 27 y.o. female who presents to the emergency department with a chief complaint of vomiting with associated nausea, diarrhea, generalized abdominal pain, myalgias, dyspnea, and productive cough with yellow sputum that began 3 days ago.  She denies fever, chills, dysuria, chest pain, back pain, vaginal bleeding, discharge, or pain, constipation, hematuria, frequency, hesitancy, hematemesis, melena,or  Hematochezia.  She reports 5-6 episodes of diarrhea daily that began 2 days ago.  She reports "countless" episodes of NBNB emesis that began yesterday.  She reports that she has had no appetite for the last 2 days and has been unable to keep down any food or liquids since this morning.   LMP 01/17-01/19.  She reports a menstrual cycle of similar length her last month. She reports that she gave birth on 07/10/17. She is not breast feeding.  Last intercourse with her significant other in early to mid- December.  She reports that her usual menstrual cycles are 6-7 days, which is consistent with her cycle during the month of November.   No treatment prior to arrival.   The history is provided by the patient. No language interpreter was used.    Past Medical History:  Diagnosis Date  . Miscarriage     Patient Active Problem List   Diagnosis Date Noted  . Post-dates pregnancy 07/10/2017  . Electrolyte disturbance 05/03/2017  . Anemia of pregnancy 05/03/2017  . GBS bacteriuria 01/24/2017  . Supervision of other normal pregnancy, antepartum 01/24/2017  . Hyperemesis affecting pregnancy, antepartum 12/25/2016    Past Surgical History:  Procedure Laterality Date  . DILATATION & CURETTAGE/HYSTEROSCOPY WITH MYOSURE      OB History      Gravida Para Term Preterm AB Living   5 2 2   1 2    SAB TAB Ectopic Multiple Live Births   1       2       Home Medications    Prior to Admission medications   Medication Sig Start Date End Date Taking? Authorizing Provider  acetaminophen (TYLENOL) 500 MG tablet Take 500-1,000 mg by mouth every 6 (six) hours as needed for mild pain, fever or headache.    Yes [provider]  ibuprofen (ADVIL,MOTRIN) 600 MG tablet Take 1 tablet (600 mg total) by mouth every 6 (six) hours. Patient not taking: Reported on 10/31/2017 07/12/17   Renne Musca, MD  Prenatal Vit-Fe Fumarate-FA (PRENATAL MULTIVITAMIN) TABS tablet Take 1 tablet by mouth daily at 12 noon. Patient not taking: Reported on 10/31/2017 12/30/16   Constant, Peggy, MD    Family History Family History  Problem Relation Age of Onset  . Asthma Brother     Social History Social History   Tobacco Use  . Smoking status: Former Smoker    Packs/day: 0.00  . Smokeless tobacco: Never Used  Substance Use Topics  . Alcohol use: No  . Drug use: No     Allergies   Patient has no known allergies.   Review of Systems Review of Systems  Constitutional: Negative for activity change, chills and fever.  Respiratory: Positive for shortness of breath.   Cardiovascular: Negative for chest pain.  Gastrointestinal: Positive for abdominal pain, diarrhea, nausea and vomiting.  Genitourinary: Negative for dysuria, pelvic pain and vaginal bleeding.  Musculoskeletal: Positive for myalgias. Negative for back pain, neck pain and neck stiffness.  Skin: Negative for rash.  Allergic/Immunologic: Negative for immunocompromised state.  Neurological: Negative for headaches.  Psychiatric/Behavioral: Negative for confusion.   Physical Exam Updated Vital Signs BP 129/80   Pulse 88   Temp 97.9 F (36.6 C) (Oral)   Resp 20   Ht 5\' 7"  (1.702 m)   Wt 77.1 kg (170 lb)   SpO2 100%   Breastfeeding? No   BMI 26.63 kg/m   Physical Exam   Constitutional:  Non-toxic appearance. She does not appear ill. No distress.  HENT:  Head: Normocephalic.  Eyes: Conjunctivae are normal.  Neck: Neck supple.  Cardiovascular: Normal rate, regular rhythm, normal heart sounds and intact distal pulses. Exam reveals no gallop and no friction rub.  No murmur heard. Pulmonary/Chest: Effort normal. No respiratory distress. She exhibits no tenderness.  Coarse expiratory breath sounds bilaterally. No over rhonchi, wheezes, or rales.   Abdominal: Soft. Bowel sounds are normal. She exhibits no distension and no mass. There is tenderness. There is no rebound and no guarding. No hernia.  Generalized abdominal wall tenderness to palpation with some focal tenderness over the suprapubic area.  No peritoneal signs.  No CVA tenderness bilaterally.  No tenderness over McBurney's point.  Negative Murphy sign.  Neurological: She is alert.  Skin: Skin is warm. No rash noted.  Psychiatric: Her behavior is normal.  Nursing note and vitals reviewed.  ED Treatments / Results  Labs (all labs ordered are listed, but only abnormal results are displayed) Labs Reviewed  COMPREHENSIVE METABOLIC PANEL - Abnormal; Notable for the following components:      Result Value   Potassium 3.4 (*)    CO2 15 (*)    Glucose, Bld 109 (*)    Total Protein 8.4 (*)    All other components within normal limits  HCG, QUANTITATIVE, PREGNANCY - Abnormal; Notable for the following components:   hCG, Beta Chain, Quant, S [id number] (*)    All other components within normal limits  I-STAT BETA HCG BLOOD, ED ([facility], [facility], [facility] ONLY) - Abnormal; Notable for the following components:   I-stat hCG, quantitative >2,000.0 (*)    All other components within normal limits  LIPASE, BLOOD  CBC  URINALYSIS, ROUTINE W REFLEX MICROSCOPIC  INFLUENZA PANEL BY PCR (TYPE A & B)    EKG  EKG Interpretation None       Radiology Dg Chest 2 View  Result Date: 10/31/2017 CLINICAL DATA:  Nausea,  vomiting, diarrhea and generalized abdominal pain today. EXAM: CHEST  2 VIEW COMPARISON:  12/25/2016 FINDINGS: The heart size and mediastinal contours are within normal limits. Both lungs are clear. No pleural effusion or pneumothorax. The visualized skeletal structures are unremarkable. IMPRESSION: Normal chest radiographs. Electronically Signed   By: Amie Portlandavid  Ormond M.D.   On: 10/31/2017 21:23   [place] Ob Comp < 14 Wks  Result Date: 10/31/2017 CLINICAL DATA:  Nausea and vomiting for 2 weeks. Positive pregnancy test. Unknown LMP. Quantitative beta HCG is 115,987. patient refused transvaginal imaging. EXAM: OBSTETRIC <14 WK ULTRASOUND TECHNIQUE: Transabdominal ultrasound was performed for evaluation of the gestation as well as the maternal uterus and adnexal regions. COMPARISON:  None. FINDINGS: Intrauterine gestational sac: A single intrauterine gestational sac is present. Yolk sac:  Yolk sac is present. Embryo:  Fetal pole is identified. Cardiac Activity: Fetal cardiac activity is observed. Heart Rate: 135 bpm CRL:  8.1 mm   6 w 5 d                  [place] EDC: 06/21/2018 Subchorionic hemorrhage:  None visualized. Maternal uterus/adnexae: Uterus is anteverted. Prominent myometrium posteriorly likely representing contraction. No definite myometrial mass lesions. Left ovary is demonstrated and appears normal. Right ovary is not visualized. No free fluid in the pelvis. IMPRESSION: A single intrauterine pregnancy is demonstrated. Estimated gestational age by crown-rump length is 6 weeks 5 days. Electronically Signed   By: Burman Nieves M.D.   On: 10/31/2017 21:50    Procedures Procedures (including critical care time)  Medications Ordered in ED Medications  acetaminophen (TYLENOL) tablet 650 mg (not administered)  ondansetron (ZOFRAN-ODT) disintegrating tablet 4 mg (4 mg Oral Given 10/31/17 1635)  sodium chloride 0.9 % bolus 1,000 mL (1,000 mLs Intravenous New Bag/Given 10/31/17 2024)  ondansetron (ZOFRAN)  injection 4 mg (4 mg Intravenous Given 10/31/17 2038)     Initial Impression / Assessment and Plan / ED Course  I have reviewed the triage vital signs and the nursing notes.  Pertinent labs & imaging results that were available during my care of the patient were reviewed by me and considered in my medical decision making (see chart for details).     27 year old presenting with nausea, vomiting, diarrhea, myalgias, productive cough, and dyspnea. iStat beta hCG >200; quantitative hCG 454,098. LMP 01/17. She is 14 weeks post-partum. Last intercourse in early to mid-December.  K3.4. Bicarb 15, which could be secondary to diarrhea, but is concerning for respiratory source, including pulmonary metastasis of choriocarcinoma.  Influenza panel, pelvic ultrasound and chest x-ray are pending. Nausea improved with IV zofran. PO Tylenol and IVF bolus given.  She was discussed with Dr. Erma Heritage, attending physician.  She has a history of hyperemesis gravidarum with previous pregnancies, but differential diagnosis includes pneumonia, influenza, choriocarcinoma, PE, and gastroenteritis. Doubt ectopic pregancy at this time given since she has no complaints of vaginal bleeding. Patient care transferred to PA Caccavale at the end of my shift. Patient presentation, ED course, and plan of care discussed with review of all pertinent labs and imaging. Please see his/her note for further details regarding further ED course and disposition.   Final Clinical Impressions(s) / ED Diagnoses   Final diagnoses:  None    ED Discharge Orders    None       Normal Recinos A, PA-C 10/31/17 2209    Shaune Pollack, MD 11/01/17 [phone]

## 2017-10-31 NOTE — ED Triage Notes (Signed)
Pt to the ED with c/o nausea, vomiting, diarrhea and generalized abd pain onset this am.  Pt st's can't keep anything down

## 2017-10-31 NOTE — ED Notes (Signed)
Patient shaking while in waiting room, RN alerted, vital signs WNL, patient alert. Warm blanket given and laid patient down on long chairs for comfort. Patient stable at this time.

## 2017-10-31 NOTE — ED Notes (Signed)
Patient transported to X-ray 

## 2017-11-01 MED ORDER — CEPHALEXIN 500 MG PO CAPS: 500.0000 mg | ORAL_CAPSULE | Freq: Two times a day (BID) | ORAL | 0 refills | Status: DC

## 2017-11-01 MED ORDER — METOCLOPRAMIDE HCL 10 MG PO TABS: 10.0000 mg | ORAL_TABLET | Freq: Three times a day (TID) | ORAL | 0 refills | Status: DC | PRN

## 2017-11-01 NOTE — ED Notes (Signed)
Pt given Ginger Ale and ice

## 2017-11-01 NOTE — Discharge Instructions (Signed)
Take antibiotics as prescribed.  Take the entire course of antibiotics, even if your symptoms improve. Use Reglan as needed for nausea or vomiting. It is very important that you stay well-hydrated with water. Follow up with your OB/GYN for further management of your pregnancy and assistance with nausea and vomiting. Return to the emergency room if you develop persistent vomiting despite medication, or any new or worsening symptoms.

## 2017-11-02 ENCOUNTER — Other Ambulatory Visit: Payer: Self-pay

## 2017-11-02 ENCOUNTER — Inpatient Hospital Stay (HOSPITAL_COMMUNITY)
Admission: EM | Admit: 2017-11-02 | Discharge: 2017-11-06 | DRG: 833 | Disposition: A | Discharge status: Home / Self Care | Payer: Medicaid Other | Attending: Family Medicine | Admitting: Family Medicine

## 2017-11-02 ENCOUNTER — Encounter (HOSPITAL_COMMUNITY): Payer: Self-pay | Admitting: Student

## 2017-11-02 ENCOUNTER — Emergency Department (HOSPITAL_COMMUNITY): Payer: Medicaid Other

## 2017-11-02 DIAGNOSIS — J101 Influenza due to other identified influenza virus with other respiratory manifestations: Secondary | ICD-10-CM | POA: Diagnosis not present

## 2017-11-02 DIAGNOSIS — O210 Mild hyperemesis gravidarum: Secondary | ICD-10-CM | POA: Diagnosis not present

## 2017-11-02 DIAGNOSIS — Z8701 Personal history of pneumonia (recurrent): Secondary | ICD-10-CM

## 2017-11-02 DIAGNOSIS — O219 Vomiting of pregnancy, unspecified: Secondary | ICD-10-CM | POA: Diagnosis present

## 2017-11-02 DIAGNOSIS — O99511 Diseases of the respiratory system complicating pregnancy, first trimester: Secondary | ICD-10-CM | POA: Diagnosis present

## 2017-11-02 DIAGNOSIS — O9989 Other specified diseases and conditions complicating pregnancy, childbirth and the puerperium: Secondary | ICD-10-CM | POA: Diagnosis present

## 2017-11-02 DIAGNOSIS — Z3A01 Less than 8 weeks gestation of pregnancy: Secondary | ICD-10-CM

## 2017-11-02 DIAGNOSIS — Z87891 Personal history of nicotine dependence: Secondary | ICD-10-CM

## 2017-11-02 DIAGNOSIS — O99281 Endocrine, nutritional and metabolic diseases complicating pregnancy, first trimester: Secondary | ICD-10-CM | POA: Diagnosis present

## 2017-11-02 DIAGNOSIS — E876 Hypokalemia: Secondary | ICD-10-CM | POA: Diagnosis present

## 2017-11-02 DIAGNOSIS — O211 Hyperemesis gravidarum with metabolic disturbance: Principal | ICD-10-CM | POA: Diagnosis present

## 2017-11-02 DIAGNOSIS — E860 Dehydration: Secondary | ICD-10-CM | POA: Diagnosis present

## 2017-11-02 LAB — URINALYSIS, ROUTINE W REFLEX MICROSCOPIC
Bacteria, UA: NONE SEEN
Bilirubin Urine: NEGATIVE
GLUCOSE, UA: NEGATIVE mg/dL
Hgb urine dipstick: NEGATIVE
Ketones, ur: 80 mg/dL — AB
NITRITE: NEGATIVE
PROTEIN: 30 mg/dL — AB
Specific Gravity, Urine: 1.03 (ref 1.005–1.030)
pH: 6 (ref 5.0–8.0)

## 2017-11-02 LAB — URINE CULTURE

## 2017-11-02 LAB — COMPREHENSIVE METABOLIC PANEL
ALK PHOS: 63 U/L (ref 38–126)
ALT: 37 U/L (ref 14–54)
AST: 31 U/L (ref 15–41)
Albumin: 4.1 g/dL (ref 3.5–5.0)
Anion gap: 13 (ref 5–15)
BUN: 14 mg/dL (ref 6–20)
CALCIUM: 9.2 mg/dL (ref 8.9–10.3)
CO2: 21 mmol/L — AB (ref 22–32)
CREATININE: 0.7 mg/dL (ref 0.44–1.00)
Chloride: 102 mmol/L (ref 101–111)
GFR calc non Af Amer: >60 mL/min (ref >60)
Glucose, Bld: 107 mg/dL — ABNORMAL HIGH (ref 65–99)
Potassium: 3.2 mmol/L — ABNORMAL LOW (ref 3.5–5.1)
SODIUM: 136 mmol/L (ref 135–145)
Total Bilirubin: 1.2 mg/dL (ref 0.3–1.2)
Total Protein: 7.8 g/dL (ref 6.5–8.1)

## 2017-11-02 LAB — CBC WITH DIFFERENTIAL/PLATELET
BASOS PCT: 0 %
Basophils Absolute: 0 10*3/uL (ref 0.0–0.1)
EOS ABS: 0 10*3/uL (ref 0.0–0.7)
Eosinophils Relative: 0 %
HCT: 35.7 % — ABNORMAL LOW (ref 36.0–46.0)
Hemoglobin: 12.3 g/dL (ref 12.0–15.0)
Lymphocytes Relative: 6 %
Lymphs Abs: 0.6 10*3/uL — ABNORMAL LOW (ref 0.7–4.0)
MCH: 29.5 pg (ref 26.0–34.0)
MCHC: 34.5 g/dL (ref 30.0–36.0)
MCV: 85.6 fL (ref 78.0–100.0)
MONO ABS: 0.4 10*3/uL (ref 0.1–1.0)
MONOS PCT: 4 %
NEUTROS PCT: 90 %
Neutro Abs: 8.6 10*3/uL — ABNORMAL HIGH (ref 1.7–7.7)
PLATELETS: 220 10*3/uL (ref 150–400)
RBC: 4.17 MIL/uL (ref 3.87–5.11)
RDW: 12.7 % (ref 11.5–15.5)
WBC: 9.6 10*3/uL (ref 4.0–10.5)

## 2017-11-02 LAB — INFLUENZA PANEL BY PCR (TYPE A & B)
INFLAPCR: POSITIVE — AB
INFLBPCR: NEGATIVE

## 2017-11-02 LAB — I-STAT BETA HCG BLOOD, ED (MC, WL, AP ONLY): I-stat hCG, quantitative: >2000 m[IU]/mL — ABNORMAL HIGH (ref <5)

## 2017-11-02 LAB — I-STAT CG4 LACTIC ACID, ED: Lactic Acid, Venous: 1.26 mmol/L (ref 0.5–1.9)

## 2017-11-02 MED ORDER — OSELTAMIVIR PHOSPHATE 75 MG PO CAPS
75.0000 mg | ORAL_CAPSULE | Freq: Once | ORAL | Status: AC
  Administered 2017-11-02: 75 mg via ORAL
  Filled 2017-11-02: qty 1

## 2017-11-02 MED ORDER — OSELTAMIVIR PHOSPHATE 75 MG PO CAPS
75.0000 mg | ORAL_CAPSULE | Freq: Two times a day (BID) | ORAL | Status: DC
  Administered 2017-11-03 – 2017-11-06 (×7): 75 mg via ORAL
  Filled 2017-11-02 (×7): qty 1

## 2017-11-02 MED ORDER — PRENATAL PLUS 27-1 MG PO TABS
1.0000 | ORAL_TABLET | Freq: Every day | ORAL | Status: DC
  Filled 2017-11-02 (×4): qty 1

## 2017-11-02 MED ORDER — SODIUM CHLORIDE 0.9 % IV BOLUS (SEPSIS)
1000.0000 mL | Freq: Once | INTRAVENOUS | Status: AC
  Administered 2017-11-02: 1000 mL via INTRAVENOUS

## 2017-11-02 MED ORDER — METOCLOPRAMIDE HCL 5 MG/ML IJ SOLN
10.0000 mg | Freq: Once | INTRAMUSCULAR | Status: AC
  Administered 2017-11-02: 10 mg via INTRAVENOUS
  Filled 2017-11-02: qty 2

## 2017-11-02 MED ORDER — IBUPROFEN 200 MG PO TABS: 600.0000 mg | ORAL_TABLET | Freq: Four times a day (QID) | ORAL | Status: DC

## 2017-11-02 MED ORDER — ACETAMINOPHEN 325 MG PO TABS
650.0000 mg | ORAL_TABLET | Freq: Once | ORAL | Status: AC
  Administered 2017-11-02: 650 mg via ORAL
  Filled 2017-11-02: qty 2

## 2017-11-02 MED ORDER — DIPHENHYDRAMINE HCL 50 MG/ML IJ SOLN
12.5000 mg | Freq: Once | INTRAMUSCULAR | Status: AC
  Administered 2017-11-02: 12.5 mg via INTRAVENOUS
  Filled 2017-11-02: qty 1

## 2017-11-02 MED ORDER — KCL IN DEXTROSE-NACL 10-5-0.45 MEQ/L-%-% IV SOLN
INTRAVENOUS | Status: DC
  Administered 2017-11-02 – 2017-11-05 (×5): via INTRAVENOUS
  Filled 2017-11-02 (×6): qty 1000

## 2017-11-02 MED ORDER — PROMETHAZINE HCL 25 MG/ML IJ SOLN
12.5000 mg | Freq: Once | INTRAMUSCULAR | Status: AC
  Administered 2017-11-02: 12.5 mg via INTRAVENOUS
  Filled 2017-11-02: qty 1

## 2017-11-02 MED ORDER — ACETAMINOPHEN 500 MG PO TABS: 500.0000 mg | ORAL_TABLET | Freq: Four times a day (QID) | ORAL | Status: DC | PRN

## 2017-11-02 MED ORDER — ONDANSETRON HCL 4 MG/2ML IJ SOLN
4.0000 mg | Freq: Four times a day (QID) | INTRAMUSCULAR | Status: DC | PRN
  Administered 2017-11-02 – 2017-11-05 (×8): 4 mg via INTRAVENOUS
  Filled 2017-11-02 (×7): qty 2

## 2017-11-02 MED ORDER — ACETAMINOPHEN 325 MG PO TABS
650.0000 mg | ORAL_TABLET | Freq: Four times a day (QID) | ORAL | Status: DC | PRN
  Administered 2017-11-02 – 2017-11-04 (×4): 650 mg via ORAL
  Filled 2017-11-02 (×4): qty 2

## 2017-11-02 MED ORDER — SODIUM CHLORIDE 0.9 % IV BOLUS (SEPSIS)
1000.0000 mL | Freq: Once | INTRAVENOUS | Status: AC
Start: 2017-11-02 — End: 2017-11-02
  Administered 2017-11-02: 1000 mL via INTRAVENOUS

## 2017-11-02 MED ORDER — ACETAMINOPHEN 650 MG RE SUPP: 650.0000 mg | Freq: Four times a day (QID) | RECTAL | Status: DC | PRN

## 2017-11-02 MED ORDER — METOCLOPRAMIDE HCL 10 MG PO TABS
10.0000 mg | ORAL_TABLET | Freq: Three times a day (TID) | ORAL | Status: DC | PRN
  Administered 2017-11-03 – 2017-11-06 (×5): 10 mg via ORAL
  Filled 2017-11-02 (×5): qty 1

## 2017-11-02 MED ORDER — ONDANSETRON HCL 4 MG PO TABS
4.0000 mg | ORAL_TABLET | Freq: Four times a day (QID) | ORAL | Status: DC | PRN
  Filled 2017-11-02: qty 1

## 2017-11-02 NOTE — ED Notes (Signed)
RN attempted x 2 for IV with no success 

## 2017-11-02 NOTE — ED Provider Notes (Signed)
[facility] EMERGENCY DEPARTMENT Provider Note   CSN: [id number] Arrival date & time: 11/02/17  1117     History   Chief Complaint Chief Complaint  Patient presents with  . flu like symptoms  . Abdominal Pain  . Emesis  . Cough    HPI Natalie Riggs is a 27 y.o. female.  HPI Natalie Riggs is a 27 y.o. female presents to ED with complaint of nausea, vomiting, cough, chest pain, fever, chills. Pt is [redacted]wks pregnant, was seen for hyperemesis gravidum 2 days ago in ED, with [place] confirming IUP. She was discharged with rreglan. States cough and chest pain is new, similar to prior pneumonia.  Patient has not taken any medications prior to coming in other than her Reglan.  She states it is not working for her nausea and vomiting.  She believes that the Keflex that she was prescribed for possible UTI is making her sicker.  I reviewed urine culture, it came back with contaminated species.  Patient does report diffuse abdominal pain that is worse with vomiting.  She denies diarrhea.  No urinary symptoms.  Past Medical History:  Diagnosis Date  . Miscarriage     Patient Active Problem List   Diagnosis Date Noted  . Post-dates pregnancy 07/10/2017  . Electrolyte disturbance 05/03/2017  . Anemia of pregnancy 05/03/2017  . GBS bacteriuria 01/24/2017  . Supervision of other normal pregnancy, antepartum 01/24/2017  . Hyperemesis affecting pregnancy, antepartum 12/25/2016    Past Surgical History:  Procedure Laterality Date  . DILATATION & CURETTAGE/HYSTEROSCOPY WITH MYOSURE      OB History    Gravida Para Term Preterm AB Living   5 2 2   1 2    SAB TAB Ectopic Multiple Live Births   1       2       Home Medications    Prior to Admission medications   Medication Sig Start Date End Date Taking? Authorizing Provider  acetaminophen (TYLENOL) 500 MG tablet Take 500-1,000 mg by mouth every 6 (six) hours as needed for mild pain, fever or headache.     [provider]  cephALEXin (KEFLEX) 500 MG capsule Take 1 capsule (500 mg total) by mouth 2 (two) times daily for 5 days. 11/01/17 11/06/17  Caccavale, Sophia, PA-C  ibuprofen (ADVIL,MOTRIN) 600 MG tablet Take 1 tablet (600 mg total) by mouth every 6 (six) hours. Patient not taking: Reported on 10/31/2017 07/12/17   Renne Musca, MD  metoCLOPramide (REGLAN) 10 MG tablet Take 1 tablet (10 mg total) by mouth every 8 (eight) hours as needed for nausea. 11/01/17   Caccavale, Sophia, PA-C  Prenatal Vit-Fe Fumarate-FA (PRENATAL MULTIVITAMIN) TABS tablet Take 1 tablet by mouth daily at 12 noon. Patient not taking: Reported on 10/31/2017 12/30/16   Constant, Peggy, MD    Family History Family History  Problem Relation Age of Onset  . Asthma Brother     Social History Social History   Tobacco Use  . Smoking status: Former Smoker    Packs/day: 0.00  . Smokeless tobacco: Never Used  Substance Use Topics  . Alcohol use: No  . Drug use: No     Allergies   Patient has no known allergies.   Review of Systems Review of Systems  Constitutional: Positive for chills, fatigue and fever.  Respiratory: Positive for cough, chest tightness and shortness of breath.   Cardiovascular: Positive for chest pain. Negative for palpitations and leg swelling.  Gastrointestinal: Positive  for abdominal pain, nausea and vomiting. Negative for diarrhea.  Genitourinary: Positive for pelvic pain. Negative for dysuria, flank pain, vaginal bleeding, vaginal discharge and vaginal pain.  Musculoskeletal: Negative for arthralgias, myalgias, neck pain and neck stiffness.  Skin: Negative for rash.  Neurological: Positive for weakness. Negative for dizziness and headaches.  All other systems reviewed and are negative.    Physical Exam Updated Vital Signs BP (!) 142/83 (BP Location: Right Arm)   Pulse 97   Temp (S) 100.1 F (37.8 C)   Resp 20   SpO2 100%   Physical Exam  Constitutional: She is oriented to  person, place, and time. She appears well-developed and well-nourished.  tearful  HENT:  Head: Normocephalic.  Eyes: Conjunctivae are normal.  Neck: Neck supple.  Cardiovascular: Normal rate, regular rhythm and normal heart sounds.  Pulmonary/Chest: Effort normal. No respiratory distress. She has no wheezes. She has no rales.  Coughing, rhonchi bilaterally  Abdominal: Soft. Bowel sounds are normal. She exhibits no distension. There is tenderness. There is no rebound.  Suprapubic tenderness  Musculoskeletal: She exhibits no edema.  Neurological: She is alert and oriented to person, place, and time.  Skin: Skin is warm and dry.  Psychiatric: She has a normal mood and affect. Her behavior is normal.  Nursing note and vitals reviewed.    ED Treatments / Results  Labs (all labs ordered are listed, but only abnormal results are displayed) Labs Reviewed  COMPREHENSIVE METABOLIC PANEL - Abnormal; Notable for the following components:      Result Value   Potassium 3.2 (*)    CO2 21 (*)    Glucose, Bld 107 (*)    All other components within normal limits  CBC WITH DIFFERENTIAL/PLATELET - Abnormal; Notable for the following components:   HCT 35.7 (*)    Neutro Abs 8.6 (*)    Lymphs Abs 0.6 (*)    All other components within normal limits  I-STAT BETA HCG BLOOD, ED ([facility], [facility], [facility] ONLY) - Abnormal; Notable for the following components:   I-stat hCG, quantitative >2,000.0 (*)    All other components within normal limits  URINALYSIS, ROUTINE W REFLEX MICROSCOPIC  I-STAT CG4 LACTIC ACID, ED  I-STAT CG4 LACTIC ACID, ED    EKG  EKG Interpretation None       Radiology Dg Chest 2 View  Result Date: 10/31/2017 CLINICAL DATA:  Nausea, vomiting, diarrhea and generalized abdominal pain today. EXAM: CHEST  2 VIEW COMPARISON:  12/25/2016 FINDINGS: The heart size and mediastinal contours are within normal limits. Both lungs are clear. No pleural effusion or pneumothorax. The visualized skeletal  structures are unremarkable. IMPRESSION: Normal chest radiographs. Electronically Signed   By: Amie Portland M.D.   On: 10/31/2017 21:23   US Ob Comp < 14 Wks  Result Date: 10/31/2017 CLINICAL DATA:  Nausea and vomiting for 2 weeks. Positive pregnancy test. Unknown LMP. Quantitative beta HCG is 115,987. patient refused transvaginal imaging. EXAM: OBSTETRIC <14 WK ULTRASOUND TECHNIQUE: Transabdominal ultrasound was performed for evaluation of the gestation as well as the maternal uterus and adnexal regions. COMPARISON:  None. FINDINGS: Intrauterine gestational sac: A single intrauterine gestational sac is present. Yolk sac:  Yolk sac is present. Embryo:  Fetal pole is identified. Cardiac Activity: Fetal cardiac activity is observed. Heart Rate: 135 bpm CRL:   8.1 mm   6 w 5 d                  [place] EDC: 06/21/2018 Subchorionic hemorrhage:  None visualized. Maternal uterus/adnexae: Uterus is anteverted. Prominent myometrium posteriorly likely representing contraction. No definite myometrial mass lesions. Left ovary is demonstrated and appears normal. Right ovary is not visualized. No free fluid in the pelvis. IMPRESSION: A single intrauterine pregnancy is demonstrated. Estimated gestational age by crown-rump length is 6 weeks 5 days. Electronically Signed   By: Burman NievesWilliam  Stevens M.D.   On: 10/31/2017 21:50    Procedures Procedures (including critical care time)  Medications Ordered in ED Medications - No data to display   Initial Impression / Assessment and Plan / ED Course  I have reviewed the triage vital signs and the nursing notes.  Pertinent labs & imaging results that were available during my care of the patient were reviewed by me and considered in my medical decision making (see chart for details).     Patient is G5 P2, [redacted] weeks gestation, here with persistent nausea and vomiting, history of the same.  Patient is also reporting fever, chills, body aches, cough, congestion, shortness of breath.   Was seen 2 days ago for nausea and vomiting, confirmed IUP.  During exam, patient is coughing, rhonchi in all lung fields.  Temperature 100.1.  She is vomiting.  Will give IV fluids, check labs, get a chest x-ray, get influenza panel.  Will hydrate.  Nausea temporarily improved, patient continues to have headache.  Tylenol ordered.   Patient apparently continues to have vomiting.  Ordered Phenergan for additional nausea relief.  Patient reassessed, her vital signs have been stable.  I have discussed with her possibility of going home, however she is concerned because she still states that she is vomiting and unable to keep anything down.  I discussed patient with OB/GYN, spoke with Dr. Erin FullingHarraway-Smith, who advised since patient is positive for influenza A, to keep her at this hospital.  They will consult only if needed.  I will call hospitalist.  7:28 PM Spoke with medicine, will admit.   Vitals:   11/02/17 1600 11/02/17 1700 11/02/17 1730 11/02/17 1800  BP: 133/69 132/73 99/71 136/69  Pulse: 89 83 88 87  Resp:    20  Temp:      TempSrc:      SpO2: 98% 97% 98% 97%     Final Clinical Impressions(s) / ED Diagnoses   Final diagnoses:  Influenza A  Hyperemesis gravidarum    ED Discharge Orders    None       Iona CoachKirichenko, Jibreel Fedewa, PA-C 11/02/17 2026    Nira Connardama, Pedro Eduardo, MD 11/02/17 2047

## 2017-11-02 NOTE — ED Notes (Signed)
Family med Capped- Paged to [facility]

## 2017-11-02 NOTE — ED Triage Notes (Signed)
Pt presents with vomiting x3 days, unable to keep food/water down, aches all over, denies fever, last diarrhea was 2 days ago.  Pt states she was here in the ED 2-3 days ago.

## 2017-11-02 NOTE — H&P (Signed)
[facility]                                                                                    Patient Demographics  Natalie Riggs, is a 27 y.o. female  CSN: [id number]  MRN: [id number]  DOB - 09/12/1991  Admit Date - 11/02/2017  Outpatient Primary MD for the patient is Patient, No Pcp Per   With History of -  Past Medical History:  Diagnosis Date  . Miscarriage       Past Surgical History:  Procedure Laterality Date  . DILATATION & CURETTAGE/HYSTEROSCOPY WITH MYOSURE      in for   Chief Complaint  Patient presents with  . flu like symptoms  . Abdominal Pain  . Emesis  . Cough     HPI  Natalie Riggs  is a 27 y.o. female, gravida 5 para 2 , 7-week pregnant presenting with 3 days history of fever, chills, body aches, cough and shortness of breath in addition to intractable nausea and vomiting.  Patient was diagnosed with the flu and was started on Tamiflu in the emergency room however she had to be admitted because of dehydration and intractable nausea and vomiting.  Management discussed with Dr. Katrinka Blazing from OB/GYN   Review of Systems    In addition to the HPI above,  Fever and chills noted  Headache, No changes with Vision or hearing, No problems swallowing food or Liquids,  Chest pain, Cough & Shortness of Breath, No Abdominal pain,  Bowel movements are regular, No Blood in stool or Urine, No dysuria, No new skin rashes or bruises, No new weakness, tingling, numbness in any extremity, No recent weight gain or loss, No polyuria, polydypsia or polyphagia, No significant Mental Stressors.  A full 10 point Review of Systems was done, except as stated above, all other Review of Systems were negative.   Social History Social History   Tobacco Use  . Smoking status: Former Smoker    Packs/day: 0.00  . Smokeless tobacco: Never Used  Substance Use Topics  . Alcohol use: No     Family History Family History  Problem Relation Age of Onset   . Asthma Brother      Prior to Admission medications   Medication Sig Start Date End Date Taking? Authorizing Provider  acetaminophen (TYLENOL) 500 MG tablet Take 500-1,000 mg by mouth every 6 (six) hours as needed for mild pain, fever or headache.     [provider]  cephALEXin (KEFLEX) 500 MG capsule Take 1 capsule (500 mg total) by mouth 2 (two) times daily for 5 days. 11/01/17 11/06/17  Caccavale, Sophia, PA-C  ibuprofen (ADVIL,MOTRIN) 600 MG tablet Take 1 tablet (600 mg total) by mouth every 6 (six) hours. Patient not taking: Reported on 10/31/2017 07/12/17   Renne Musca, MD  metoCLOPramide (REGLAN) 10 MG tablet Take 1 tablet (10 mg total) by mouth every 8 (eight) hours as needed for nausea. 11/01/17   Caccavale, Sophia, PA-C  Prenatal Vit-Fe Fumarate-FA (PRENATAL MULTIVITAMIN) TABS tablet Take 1 tablet by mouth daily at 12 noon. Patient not taking: Reported on 10/31/2017 12/30/16   Constant, Gigi Gin, MD  No Known Allergies  Physical Exam  Vitals  Blood pressure 136/69, pulse 87, temperature (S) 100.1 F (37.8 C), resp. rate 20, SpO2 97 %.   1. General acutely ill, looks tired  2. Normal affect and insight, Not Suicidal or Homicidal, Awake Alert, Oriented X 3.  3. No F.N deficits, grossly, patient moving all extremities.  4. Ears and Eyes appear Normal, Conjunctivae clear, PERRLA.  Dry oral Mucosa.  5. Supple Neck, No JVD, No cervical lymphadenopathy appriciated, No Carotid Bruits.  6. Symmetrical Chest wall movement, Good air movement bilaterally, CTAB.  7. RRR, No Gallops, Rubs or Murmurs, No Parasternal Heave.  8. Positive Bowel Sounds, Abdomen Soft, Non tender, No organomegaly appriciated,No rebound -guarding or rigidity.  9.  No Cyanosis, Normal Skin Turgor, No Skin Rash or Bruise.  10. Good muscle tone,  joints appear normal , no effusions, Normal ROM.    Data Review  CBC Recent Labs  Lab 10/31/17 1625 11/02/17 1158  WBC 9.2 9.6  HGB 13.6  12.3  HCT 38.3 35.7*  PLT 226 220  MCV 84.9 85.6  MCH 30.2 29.5  MCHC 35.5 34.5  RDW 12.4 12.7  LYMPHSABS  --  0.6*  MONOABS  --  0.4  EOSABS  --  0.0  BASOSABS  --  0.0   ------------------------------------------------------------------------------------------------------------------  Chemistries  Recent Labs  Lab 10/31/17 1625 11/02/17 1158  NA 135 136  K 3.4* 3.2*  CL 107 102  CO2 15* 21*  GLUCOSE 109* 107*  BUN 10 14  CREATININE 0.64 0.70  CALCIUM 9.5 9.2  AST 24 31  ALT 19 37  ALKPHOS 73 63  BILITOT 0.8 1.2   ------------------------------------------------------------------------------------------------------------------ estimated creatinine clearance is 114.1 mL/min (by C-G formula based on SCr of 0.7 mg/dL). ------------------------------------------------------------------------------------------------------------------ No results for input(s): TSH, T4TOTAL, T3FREE, THYROIDAB in the last 72 hours.  Invalid input(s): FREET3   Coagulation profile No results for input(s): INR, PROTIME in the last 168 hours. ------------------------------------------------------------------------------------------------------------------- No results for input(s): DDIMER in the last 72 hours. -------------------------------------------------------------------------------------------------------------------  Cardiac Enzymes No results for input(s): CKMB, TROPONINI, MYOGLOBIN in the last 168 hours.  Invalid input(s): CK ------------------------------------------------------------------------------------------------------------------ Invalid input(s): POCBNP   ---------------------------------------------------------------------------------------------------------------  Urinalysis    Component Value Date/Time   COLORURINE AMBER (A) 11/02/2017 1650   APPEARANCEUR HAZY (A) 11/02/2017 1650   APPEARANCEUR Clear 12/24/2013 2225   LABSPEC 1.030 11/02/2017 1650   LABSPEC  1.016 12/24/2013 2225   PHURINE 6.0 11/02/2017 1650   GLUCOSEU NEGATIVE 11/02/2017 1650   GLUCOSEU Negative 12/24/2013 2225   HGBUR NEGATIVE 11/02/2017 1650   BILIRUBINUR NEGATIVE 11/02/2017 1650   BILIRUBINUR Negative 12/24/2013 2225   KETONESUR 80 (A) 11/02/2017 1650   PROTEINUR 30 (A) 11/02/2017 1650   NITRITE NEGATIVE 11/02/2017 1650   LEUKOCYTESUR SMALL (A) 11/02/2017 1650   LEUKOCYTESUR Negative 12/24/2013 2225    ----------------------------------------------------------------------------------------------------------------   Imaging results:   Dg Chest 2 View  Result Date: 11/02/2017 CLINICAL DATA:  Pt c/o cough, SOB, and fever x 11 days. No hx of heart or lung problems. Pt is a former smoker. EXAM: CHEST  2 VIEW COMPARISON:  10/31/2017 FINDINGS: The heart size and mediastinal contours are within normal limits. Both lungs are clear. The visualized skeletal structures are unremarkable. IMPRESSION: No active cardiopulmonary disease. Electronically Signed   By: Norva Pavlov M.D.   On: 11/02/2017 14:33   Dg Chest 2 View  Result Date: 10/31/2017 CLINICAL DATA:  Nausea, vomiting, diarrhea and generalized abdominal pain today. EXAM: CHEST  2 VIEW  COMPARISON:  12/25/2016 FINDINGS: The heart size and mediastinal contours are within normal limits. Both lungs are clear. No pleural effusion or pneumothorax. The visualized skeletal structures are unremarkable. IMPRESSION: Normal chest radiographs. Electronically Signed   By: Amie Portlandavid  Ormond M.D.   On: 10/31/2017 21:23   [place] Ob Comp < 14 Wks  Result Date: 10/31/2017 CLINICAL DATA:  Nausea and vomiting for 2 weeks. Positive pregnancy test. Unknown LMP. Quantitative beta HCG is 115,987. patient refused transvaginal imaging. EXAM: OBSTETRIC <14 WK ULTRASOUND TECHNIQUE: Transabdominal ultrasound was performed for evaluation of the gestation as well as the maternal uterus and adnexal regions. COMPARISON:  None. FINDINGS: Intrauterine gestational  sac: A single intrauterine gestational sac is present. Yolk sac:  Yolk sac is present. Embryo:  Fetal pole is identified. Cardiac Activity: Fetal cardiac activity is observed. Heart Rate: 135 bpm CRL:   8.1 mm   6 w 5 d                  US EDC: 06/21/2018 Subchorionic hemorrhage:  None visualized. Maternal uterus/adnexae: Uterus is anteverted. Prominent myometrium posteriorly likely representing contraction. No definite myometrial mass lesions. Left ovary is demonstrated and appears normal. Right ovary is not visualized. No free fluid in the pelvis. IMPRESSION: A single intrauterine pregnancy is demonstrated. Estimated gestational age by crown-rump length is 6 weeks 5 days. Electronically Signed   By: Burman NievesWilliam  Stevens M.D.   On: 10/31/2017 21:50      Assessment & Plan  1.  Intractable nausea and vomiting with dehydration 2.  Flu 3.  Pregnancy  Plan  Admit for IV hydration Zofran as needed Tamiflu Management discussed with Dr. Katrinka BlazingSmith   DVT Prophylaxis SCDs  AM Labs Ordered, also please review Full Orders    Code Status full  Disposition Plan: Home Time spent in minutes : 38 minutes  Condition GUARDED   @SIGNATURE @

## 2017-11-02 NOTE — ED Notes (Signed)
Attempted report x1. 

## 2017-11-02 NOTE — ED Triage Notes (Signed)
Pt tearful, crying, body aches all over-- fever at triage  Pt recently dx with pregnancy-- 6.5 weeks

## 2017-11-02 NOTE — ED Notes (Signed)
IV team at bedside 

## 2017-11-03 ENCOUNTER — Telehealth: Payer: Self-pay

## 2017-11-03 DIAGNOSIS — O211 Hyperemesis gravidarum with metabolic disturbance: Secondary | ICD-10-CM | POA: Diagnosis present

## 2017-11-03 DIAGNOSIS — O210 Mild hyperemesis gravidarum: Secondary | ICD-10-CM | POA: Diagnosis not present

## 2017-11-03 DIAGNOSIS — O9989 Other specified diseases and conditions complicating pregnancy, childbirth and the puerperium: Secondary | ICD-10-CM | POA: Diagnosis present

## 2017-11-03 DIAGNOSIS — E876 Hypokalemia: Secondary | ICD-10-CM | POA: Diagnosis present

## 2017-11-03 DIAGNOSIS — E860 Dehydration: Secondary | ICD-10-CM | POA: Diagnosis present

## 2017-11-03 DIAGNOSIS — O219 Vomiting of pregnancy, unspecified: Secondary | ICD-10-CM | POA: Diagnosis not present

## 2017-11-03 DIAGNOSIS — J101 Influenza due to other identified influenza virus with other respiratory manifestations: Secondary | ICD-10-CM | POA: Diagnosis not present

## 2017-11-03 DIAGNOSIS — Z8701 Personal history of pneumonia (recurrent): Secondary | ICD-10-CM | POA: Diagnosis not present

## 2017-11-03 DIAGNOSIS — Z3A01 Less than 8 weeks gestation of pregnancy: Secondary | ICD-10-CM | POA: Diagnosis not present

## 2017-11-03 DIAGNOSIS — O99511 Diseases of the respiratory system complicating pregnancy, first trimester: Secondary | ICD-10-CM | POA: Diagnosis present

## 2017-11-03 DIAGNOSIS — Z87891 Personal history of nicotine dependence: Secondary | ICD-10-CM | POA: Diagnosis not present

## 2017-11-03 DIAGNOSIS — O99281 Endocrine, nutritional and metabolic diseases complicating pregnancy, first trimester: Secondary | ICD-10-CM | POA: Diagnosis present

## 2017-11-03 LAB — BASIC METABOLIC PANEL
ANION GAP: 10 (ref 5–15)
BUN: 8 mg/dL (ref 6–20)
CHLORIDE: 105 mmol/L (ref 101–111)
CO2: 18 mmol/L — AB (ref 22–32)
Calcium: 7.9 mg/dL — ABNORMAL LOW (ref 8.9–10.3)
Creatinine, Ser: 0.64 mg/dL (ref 0.44–1.00)
GFR calc Af Amer: >60 mL/min (ref >60)
GFR calc non Af Amer: >60 mL/min (ref >60)
Glucose, Bld: 97 mg/dL (ref 65–99)
POTASSIUM: 3.1 mmol/L — AB (ref 3.5–5.1)
Sodium: 133 mmol/L — ABNORMAL LOW (ref 135–145)

## 2017-11-03 MED ORDER — METOCLOPRAMIDE HCL 5 MG/ML IJ SOLN
5.0000 mg | Freq: Once | INTRAMUSCULAR | Status: AC
  Administered 2017-11-03: 5 mg via INTRAVENOUS
  Filled 2017-11-03: qty 2

## 2017-11-03 NOTE — Plan of Care (Signed)
  Progressing Education: Knowledge of General Education information will improve 11/03/2017 1316 - Progressing by Melany GuernseyMcLean, Finian Helvey M, RN Health Behavior/Discharge Planning: Ability to manage health-related needs will improve 11/03/2017 1316 - Progressing by Melany GuernseyMcLean, Naaman Curro M, RN Clinical Measurements: Ability to maintain clinical measurements within normal limits will improve 11/03/2017 1316 - Progressing by Melany GuernseyMcLean, Cortlan Dolin M, RN Will remain free from infection 11/03/2017 1316 - Progressing by Melany GuernseyMcLean, Milagros Middendorf M, RN Diagnostic test results will improve 11/03/2017 1316 - Progressing by Melany GuernseyMcLean, Padraic Marinos M, RN Respiratory complications will improve 11/03/2017 1316 - Progressing by Melany GuernseyMcLean, Jessicia Napolitano M, RN Cardiovascular complication will be avoided 11/03/2017 1316 - Progressing by Melany GuernseyMcLean, Azusena Erlandson M, RN Activity: Risk for activity intolerance will decrease 11/03/2017 1316 - Progressing by Melany GuernseyMcLean, Veleria Barnhardt M, RN Coping: Level of anxiety will decrease 11/03/2017 1316 - Progressing by Melany GuernseyMcLean, Marveline Profeta M, RN Elimination: Will not experience complications related to bowel motility 11/03/2017 1316 - Progressing by Melany GuernseyMcLean, Sherryann Frese M, RN Will not experience complications related to urinary retention 11/03/2017 1316 - Progressing by Melany GuernseyMcLean, Sherise Geerdes M, RN Pain Managment: General experience of comfort will improve 11/03/2017 1316 - Progressing by Melany GuernseyMcLean, Welles Walthall M, RN Safety: Ability to remain free from injury will improve 11/03/2017 1316 - Progressing by Melany GuernseyMcLean, Tex Conroy M, RN Skin Integrity: Risk for impaired skin integrity will decrease 11/03/2017 1316 - Progressing by Melany GuernseyMcLean, Anabela Crayton M, RN

## 2017-11-03 NOTE — Progress Notes (Signed)
PROGRESS NOTE    Natalie Riggs  XBJ:[id number] DOB: April 07, 1991 DOA: 11/02/2017 PCP: Patient, No Pcp Per    Brief Narrative:   27 y.o. female, gravida 5 para 2 , 7-week pregnant presenting with 3 days history of fever, chills, body aches, cough and shortness of breath in addition to intractable nausea and vomiting.  Patient was diagnosed with the flu and was started on Tamiflu in the emergency room however she had to be admitted because of dehydration and intractable nausea and vomiting     Assessment & Plan:   Active Problems:   Nausea and vomiting in pregnancy - Will continue fluids and supportive therapy - clear liquid diet - advance diet as tolerated.  Influenza - continue tamiflu   DVT prophylaxis: SCD's Code Status: Full Family Communication: none at bedside. Disposition Plan: pending improvement in condition   Consultants:   none   Procedures: none   Antimicrobials: tamiflu   Subjective: Pt has no new complaints no acute issues overnight.  Objective: Vitals:   11/02/17 2140 11/03/17 0200 11/03/17 0600 11/03/17 1035  BP: 129/60 127/71 108/81 127/70  Pulse: 68 75 (!) 58 71  Resp: 18 18 18 18   Temp: 99.5 F (37.5 C) 99.4 F (37.4 C) 99.3 F (37.4 C) 99.2 F (37.3 C)  TempSrc: Oral Oral Oral Axillary  SpO2: 99% 100% 100% 100%  Weight: 78.2 kg (172 lb 6.4 oz)     Height: 5\' 7"  (1.702 m)       Intake/Output Summary (Last 24 hours) at 11/03/2017 1641 Last data filed at 11/03/2017 1308 Gross per 24 hour  Intake 3832.5 ml  Output -  Net 3832.5 ml   Filed Weights   11/02/17 2140  Weight: 78.2 kg (172 lb 6.4 oz)    Examination:  General exam: Appears calm and comfortable, in nad. Respiratory system: Clear to auscultation. Respiratory effort normal. Equal chest rise. Cardiovascular system: S1 & S2 heard, RRR. No JVD, murmurs, rubs, gallops or clicks. No pedal edema. Gastrointestinal system: Abdomen is nondistended, soft and nontender. No  organomegaly or masses felt. Normal bowel sounds heard. Central nervous system: Alert and oriented. No focal neurological deficits. Extremities: Symmetric 5 x 5 power. Skin: No rashes, lesions or ulcers, on limited exam. Psychiatry:. Mood & affect appropriate.     Data Reviewed: I have personally reviewed following labs and imaging studies  CBC: Recent Labs  Lab 10/31/17 1625 11/02/17 1158  WBC 9.2 9.6  NEUTROABS  --  8.6*  HGB 13.6 12.3  HCT 38.3 35.7*  MCV 84.9 85.6  PLT 226 220   Basic Metabolic Panel: Recent Labs  Lab 10/31/17 1625 11/02/17 1158 11/03/17 0532  NA 135 136 133*  K 3.4* 3.2* 3.1*  CL 107 102 105  CO2 15* 21* 18*  GLUCOSE 109* 107* 97  BUN 10 14 8   CREATININE 0.64 0.70 0.64  CALCIUM 9.5 9.2 7.9*   GFR: Estimated Creatinine Clearance: 114.7 mL/min (by C-G formula based on SCr of 0.64 mg/dL). Liver Function Tests: Recent Labs  Lab 10/31/17 1625 11/02/17 1158  AST 24 31  ALT 19 37  ALKPHOS 73 63  BILITOT 0.8 1.2  PROT 8.4* 7.8  ALBUMIN 4.4 4.1   Recent Labs  Lab 10/31/17 1625  LIPASE 26   No results for input(s): AMMONIA in the last 168 hours. Coagulation Profile: No results for input(s): INR, PROTIME in the last 168 hours. Cardiac Enzymes: No results for input(s): CKTOTAL, CKMB, CKMBINDEX, TROPONINI in the last 168 hours.  BNP (last 3 results) No results for input(s): PROBNP in the last 8760 hours. HbA1C: No results for input(s): HGBA1C in the last 72 hours. CBG: No results for input(s): GLUCAP in the last 168 hours. Lipid Profile: No results for input(s): CHOL, HDL, LDLCALC, TRIG, CHOLHDL, LDLDIRECT in the last 72 hours. Thyroid Function Tests: No results for input(s): TSH, T4TOTAL, FREET4, T3FREE, THYROIDAB in the last 72 hours. Anemia Panel: No results for input(s): VITAMINB12, FOLATE, FERRITIN, TIBC, IRON, RETICCTPCT in the last 72 hours. Sepsis Labs: Recent Labs  Lab 11/02/17 1213  LATICACIDVEN 1.26    Recent Results  (from the past 240 hour(s))  Urine culture     Status: Abnormal   Collection Time: 10/31/17 11:35 PM  Result Value Ref Range Status   Specimen Description URINE, CLEAN CATCH  Final   Special Requests NONE  Final   Culture (A)  Final    MULTIPLE SPECIES PRESENT, SUGGEST RECOLLECTION NO GROUP B STREP (S.AGALACTIAE) ISOLATED    Report Status 11/02/2017 FINAL  Final         Radiology Studies: Dg Chest 2 View  Result Date: 11/02/2017 CLINICAL DATA:  Pt c/o cough, SOB, and fever x 11 days. No hx of heart or lung problems. Pt is a former smoker. EXAM: CHEST  2 VIEW COMPARISON:  10/31/2017 FINDINGS: The heart size and mediastinal contours are within normal limits. Both lungs are clear. The visualized skeletal structures are unremarkable. IMPRESSION: No active cardiopulmonary disease. Electronically Signed   By: Norva Pavlov M.D.   On: 11/02/2017 14:33    Scheduled Meds: . oseltamivir  75 mg Oral BID  . prenatal vitamin w/FE, FA  1 tablet Oral Q1200   Continuous Infusions: . dextrose 5 % and 0.45 % NaCl with KCl 10 mEq/L 75 mL/hr at 11/03/17 1309     LOS: 0 days    Time spent: > 35 minutes  Penny Pia, MD [facility] Pager [phone]  If 7PM-7AM, please contact night-coverage [email or web address] Password [facility] 11/03/2017, 4:41 PM

## 2017-11-03 NOTE — Care Management Note (Signed)
Case Management Note  Patient Details  Name: Natalie Riggs MRN: [id number] Date of Birth: 01/04/1991  Subjective/Objective:     Pt admitted with nausea/ vomiting with the flu. She is [redacted] week pregnant. She is from home with her spouse.               Action/Plan: Plan is for patient to return home when medically stable. CM following for d/c needs, physician orders.   Expected Discharge Date:                  Expected Discharge Plan:  Home/Self Care  In-House Referral:     Discharge planning Services     Post Acute Care Choice:    Choice offered to:     DME Arranged:    DME Agency:     HH Arranged:    HH Agency:     Status of Service:  In process, will continue to follow  If discussed at Long Length of Stay Meetings, dates discussed:    Additional Comments:  Kermit BaloKelli F Pasha Broad, RN 11/03/2017, 10:16 AM

## 2017-11-03 NOTE — Progress Notes (Signed)
Patient states that she is spotting and is [redacted] weeks pregnant.  RN did not see any spotting.  RN gave patient mesh panties and maxi pad to wear.  RN notified provider on call   RN will continue to monitor patient

## 2017-11-03 NOTE — Telephone Encounter (Signed)
Post ED Visit - Positive Culture Follow-up  Culture report reviewed by antimicrobial stewardship pharmacist:  []  Enzo BiNathan Batchelder, Pharm.D. []  Celedonio MiyamotoJeremy Frens, Pharm.D., BCPS AQ-ID [x]  Garvin FilaMike Maccia, Pharm.D., BCPS []  Georgina PillionElizabeth Martin, Pharm.D., BCPS []  Windsor HeightsMinh Pham, VermontPharm.D., BCPS, AAHIVP []  Estella HuskMichelle Turner, Pharm.D., BCPS, AAHIVP []  Lysle Pearlachel Rumbarger, PharmD, BCPS []  Blake DivineShannon Parkey, PharmD []  Pollyann SamplesAndy Johnston, PharmD, BCPS  Positive urine culture Treated with Cephalexin, organism sensitive to the same and no further patient follow-up is required at this time.  Jerry CarasCullom, Johntae Broxterman Burnett 11/03/2017, 9:53 AM

## 2017-11-04 MED ORDER — PROMETHAZINE HCL 25 MG/ML IJ SOLN
12.5000 mg | Freq: Four times a day (QID) | INTRAMUSCULAR | Status: AC | PRN
  Administered 2017-11-04 – 2017-11-05 (×2): 12.5 mg via INTRAVENOUS
  Filled 2017-11-04 (×2): qty 1

## 2017-11-04 NOTE — Progress Notes (Addendum)
Pt had family emergency, left unit and went to ED at 1300. Face mask on, pt educated on reducing infection transmission, given unit phone number and instructed to call when ready to return to unit  Pt returned to unit @ 1638, brought up by ED staff

## 2017-11-04 NOTE — Progress Notes (Signed)
Pt has been vomiting  on and off during this shift in spite of Prn zofran and Reglan given most of the shift. MD on call notified and Phenergan given as ordered. Will monitor if it vomiting iimprove.

## 2017-11-04 NOTE — Progress Notes (Signed)
PROGRESS NOTE    Natalie Riggs  ZOX:[id number] DOB: [birth year] DOA: 11/02/2017 PCP: Patient, No Pcp Per    Brief Narrative:   27 y.o. female, gravida 5 para 2 , 7-week pregnant presenting with 3 days history of fever, chills, body aches, cough and shortness of breath in addition to intractable nausea and vomiting.  Patient was diagnosed with the flu and was started on Tamiflu in the emergency room however she had to be admitted because of dehydration and intractable nausea and vomiting.  Still complaining of poor oral intake and inability to keep pills down.  Assessment & Plan:   Active Problems:   Nausea and vomiting in pregnancy - Will continue fluids and supportive therapy, patient still reporting inability to eat - continue clear liquid diet - advance diet as tolerated.  Influenza - continue tamiflu  DVT prophylaxis: SCD's Code Status: Full Family Communication: none at bedside. Disposition Plan: pending improvement in condition   Consultants:   none   Procedures: none   Antimicrobials: tamiflu   Subjective: No new complaints reported.  Objective: Vitals:   11/03/17 2200 11/04/17 0200 11/04/17 0600 11/04/17 1100  BP: 127/74 (!) 113/59 (!) 149/77 121/70  Pulse: 70 61 68 60  Resp: 18 18 18 17   Temp: 99 F (37.2 C) 99 F (37.2 C) 99.9 F (37.7 C) 98.6 F (37 C)  TempSrc: Axillary Axillary Axillary Oral  SpO2: 100% 98% 99% 100%  Weight:      Height:        Intake/Output Summary (Last 24 hours) at 11/04/2017 1239 Last data filed at 11/04/2017 0550 Gross per 24 hour  Intake 2317.5 ml  Output 100 ml  Net 2217.5 ml   Filed Weights   11/02/17 2140  Weight: 78.2 kg (172 lb 6.4 oz)    Examination:exam unchanged when compared to 11/03/17  General exam: Appears calm and comfortable, in nad. Respiratory system: Clear to auscultation. Respiratory effort normal. Equal chest rise. Cardiovascular system: S1 & S2 heard, RRR. No JVD, murmurs, rubs, gallops or  clicks. No pedal edema. Gastrointestinal system: Abdomen is nondistended, soft and nontender. No organomegaly or masses felt. Normal bowel sounds heard. Central nervous system: Alert and oriented. No focal neurological deficits. Extremities: Symmetric 5 x 5 power. Skin: No rashes, lesions or ulcers, on limited exam. Psychiatry:. Mood & affect appropriate.     Data Reviewed: I have personally reviewed following labs and imaging studies  CBC: Recent Labs  Lab 10/31/17 1625 11/02/17 1158  WBC 9.2 9.6  NEUTROABS  --  8.6*  HGB 13.6 12.3  HCT 38.3 35.7*  MCV 84.9 85.6  PLT 226 220   Basic Metabolic Panel: Recent Labs  Lab 10/31/17 1625 11/02/17 1158 11/03/17 0532  NA 135 136 133*  K 3.4* 3.2* 3.1*  CL 107 102 105  CO2 15* 21* 18*  GLUCOSE 109* 107* 97  BUN 10 14 8   CREATININE 0.64 0.70 0.64  CALCIUM 9.5 9.2 7.9*   GFR: Estimated Creatinine Clearance: 114.7 mL/min (by C-G formula based on SCr of 0.64 mg/dL). Liver Function Tests: Recent Labs  Lab 10/31/17 1625 11/02/17 1158  AST 24 31  ALT 19 37  ALKPHOS 73 63  BILITOT 0.8 1.2  PROT 8.4* 7.8  ALBUMIN 4.4 4.1   Recent Labs  Lab 10/31/17 1625  LIPASE 26   No results for input(s): AMMONIA in the last 168 hours. Coagulation Profile: No results for input(s): INR, PROTIME in the last 168 hours. Cardiac Enzymes: No results for  input(s): CKTOTAL, CKMB, CKMBINDEX, TROPONINI in the last 168 hours. BNP (last 3 results) No results for input(s): PROBNP in the last 8760 hours. HbA1C: No results for input(s): HGBA1C in the last 72 hours. CBG: No results for input(s): GLUCAP in the last 168 hours. Lipid Profile: No results for input(s): CHOL, HDL, LDLCALC, TRIG, CHOLHDL, LDLDIRECT in the last 72 hours. Thyroid Function Tests: No results for input(s): TSH, T4TOTAL, FREET4, T3FREE, THYROIDAB in the last 72 hours. Anemia Panel: No results for input(s): VITAMINB12, FOLATE, FERRITIN, TIBC, IRON, RETICCTPCT in the last 72  hours. Sepsis Labs: Recent Labs  Lab 11/02/17 1213  LATICACIDVEN 1.26    Recent Results (from the past 240 hour(s))  Urine culture     Status: Abnormal   Collection Time: 10/31/17 11:35 PM  Result Value Ref Range Status   Specimen Description URINE, CLEAN CATCH  Final   Special Requests NONE  Final   Culture (A)  Final    MULTIPLE SPECIES PRESENT, SUGGEST RECOLLECTION NO GROUP B STREP (S.AGALACTIAE) ISOLATED    Report Status 11/02/2017 FINAL  Final         Radiology Studies: Dg Chest 2 View  Result Date: 11/02/2017 CLINICAL DATA:  Pt c/o cough, SOB, and fever x 11 days. No hx of heart or lung problems. Pt is a former smoker. EXAM: CHEST  2 VIEW COMPARISON:  10/31/2017 FINDINGS: The heart size and mediastinal contours are within normal limits. Both lungs are clear. The visualized skeletal structures are unremarkable. IMPRESSION: No active cardiopulmonary disease. Electronically Signed   By: Norva PavlovElizabeth  Brown M.D.   On: 11/02/2017 14:33    Scheduled Meds: . oseltamivir  75 mg Oral BID  . prenatal vitamin w/FE, FA  1 tablet Oral Q1200   Continuous Infusions: . dextrose 5 % and 0.45 % NaCl with KCl 10 mEq/L 75 mL/hr at 11/04/17 0318     LOS: 1 day    Time spent: > 35 minutes  Penny Piarlando Saharah Sherrow, MD [facility] Pager [phone]  If 7PM-7AM, please contact night-coverage [email or web address] Password [email or web address] 11/04/2017, 12:39 PM

## 2017-11-05 LAB — COMPREHENSIVE METABOLIC PANEL
ALT: 43 U/L (ref 14–54)
AST: 30 U/L (ref 15–41)
Albumin: 3.1 g/dL — ABNORMAL LOW (ref 3.5–5.0)
Alkaline Phosphatase: 72 U/L (ref 38–126)
Anion gap: 13 (ref 5–15)
BUN: 5 mg/dL — AB (ref 6–20)
CHLORIDE: 100 mmol/L — AB (ref 101–111)
CO2: 19 mmol/L — AB (ref 22–32)
CREATININE: 0.61 mg/dL (ref 0.44–1.00)
Calcium: 8.2 mg/dL — ABNORMAL LOW (ref 8.9–10.3)
GFR calc Af Amer: >60 mL/min (ref >60)
GFR calc non Af Amer: >60 mL/min (ref >60)
Glucose, Bld: 100 mg/dL — ABNORMAL HIGH (ref 65–99)
Potassium: 2.7 mmol/L — CL (ref 3.5–5.1)
SODIUM: 132 mmol/L — AB (ref 135–145)
Total Bilirubin: 1.3 mg/dL — ABNORMAL HIGH (ref 0.3–1.2)
Total Protein: 6.6 g/dL (ref 6.5–8.1)

## 2017-11-05 LAB — LIPASE, BLOOD: LIPASE: 29 U/L (ref 11–51)

## 2017-11-05 MED ORDER — METOCLOPRAMIDE HCL 5 MG/ML IJ SOLN
5.0000 mg | Freq: Four times a day (QID) | INTRAMUSCULAR | Status: DC
  Administered 2017-11-05 (×2): 5 mg via INTRAVENOUS
  Filled 2017-11-05 (×2): qty 2

## 2017-11-05 MED ORDER — POTASSIUM CHLORIDE 10 MEQ/100ML IV SOLN
10.0000 meq | INTRAVENOUS | Status: DC
Start: 2017-11-05 — End: 2017-11-05
  Administered 2017-11-05 (×2): 10 meq via INTRAVENOUS
  Filled 2017-11-05 (×4): qty 100

## 2017-11-05 MED ORDER — POTASSIUM CHLORIDE CRYS ER 20 MEQ PO TBCR
40.0000 meq | EXTENDED_RELEASE_TABLET | Freq: Once | ORAL | Status: AC
  Administered 2017-11-05: 40 meq via ORAL
  Filled 2017-11-05: qty 2

## 2017-11-05 NOTE — Progress Notes (Signed)
Md notified of critical lab K+ 2.7

## 2017-11-05 NOTE — Progress Notes (Signed)
Pt refusing IV K+. MD gave orders for PO K+. Will continue to monitor

## 2017-11-05 NOTE — Progress Notes (Addendum)
PROGRESS NOTE    MARGET OUTTEN  ZOX:[id number] DOB: 1990/12/16 DOA: 11/02/2017 PCP: Patient, No Pcp Per    Brief Narrative:   27 y.o. female, gravida 5 para 2 , 7-week pregnant presenting with 3 days history of fever, chills, body aches, cough and shortness of breath in addition to intractable nausea and vomiting.  Patient was diagnosed with the flu and was started on Tamiflu in the emergency room however she had to be admitted because of dehydration and intractable nausea and vomiting.  Complaining of continued nausea and emesis  Assessment & Plan:   Active Problems:   Nausea and vomiting in pregnancy - Her current symptoms I do not thing are related to influenza as she is more alert and has less coughing on exam. I think this is related to hyperemesis gravidum - discussed with gyn on call who agrees with scheduled IV reglan. Should this not improve on this regimen then we can dry the steroid taper. - also discussed that phenergan was another option.  Influenza - continue tamiflu as before - improving.  Addendum: Hypokalemia - Will replace with IV K and reassess  DVT prophylaxis: SCD's Code Status: Full Family Communication: none at bedside. Disposition Plan: continue to monitor as patient is not keeping food or water down.   Consultants:   none   Procedures: none   Antimicrobials: tamiflu   Subjective: Still complaining of poor oral intake.  Objective: Vitals:   11/04/17 1100 11/04/17 1803 11/04/17 2208 11/05/17 0228  BP: 121/70 124/71 121/70 136/71  Pulse: 60 71 69 (!) 59  Resp: 17 19 18 18   Temp: 98.6 F (37 C) 99.1 F (37.3 C) 98.5 F (36.9 C) 98.5 F (36.9 C)  TempSrc: Oral Oral Oral Oral  SpO2: 100% 100% 99% 100%  Weight:      Height:       No intake or output data in the 24 hours ending 11/05/17 1111 Filed Weights   11/02/17 2140  Weight: 78.2 kg (172 lb 6.4 oz)    Examination:exam unchanged when compared to 11/04/17  General exam:  Appears calm and comfortable, in nad. Respiratory system: Clear to auscultation. Respiratory effort normal. Equal chest rise. Cardiovascular system: S1 & S2 heard, RRR. No JVD, murmurs, rubs, gallops or clicks. No pedal edema. Gastrointestinal system: Abdomen is nondistended, soft and nontender. No organomegaly or masses felt. Normal bowel sounds heard. Central nervous system: Alert and oriented. No focal neurological deficits. Extremities: Symmetric 5 x 5 power. Skin: No rashes, lesions or ulcers, on limited exam. Psychiatry:. Mood & affect appropriate.     Data Reviewed: I have personally reviewed following labs and imaging studies  CBC: Recent Labs  Lab 10/31/17 1625 11/02/17 1158  WBC 9.2 9.6  NEUTROABS  --  8.6*  HGB 13.6 12.3  HCT 38.3 35.7*  MCV 84.9 85.6  PLT 226 220   Basic Metabolic Panel: Recent Labs  Lab 10/31/17 1625 11/02/17 1158 11/03/17 0532  NA 135 136 133*  K 3.4* 3.2* 3.1*  CL 107 102 105  CO2 15* 21* 18*  GLUCOSE 109* 107* 97  BUN 10 14 8   CREATININE 0.64 0.70 0.64  CALCIUM 9.5 9.2 7.9*   GFR: Estimated Creatinine Clearance: 114.7 mL/min (by C-G formula based on SCr of 0.64 mg/dL). Liver Function Tests: Recent Labs  Lab 10/31/17 1625 11/02/17 1158  AST 24 31  ALT 19 37  ALKPHOS 73 63  BILITOT 0.8 1.2  PROT 8.4* 7.8  ALBUMIN 4.4 4.1  Recent Labs  Lab 10/31/17 1625  LIPASE 26   No results for input(s): AMMONIA in the last 168 hours. Coagulation Profile: No results for input(s): INR, PROTIME in the last 168 hours. Cardiac Enzymes: No results for input(s): CKTOTAL, CKMB, CKMBINDEX, TROPONINI in the last 168 hours. BNP (last 3 results) No results for input(s): PROBNP in the last 8760 hours. HbA1C: No results for input(s): HGBA1C in the last 72 hours. CBG: No results for input(s): GLUCAP in the last 168 hours. Lipid Profile: No results for input(s): CHOL, HDL, LDLCALC, TRIG, CHOLHDL, LDLDIRECT in the last 72 hours. Thyroid  Function Tests: No results for input(s): TSH, T4TOTAL, FREET4, T3FREE, THYROIDAB in the last 72 hours. Anemia Panel: No results for input(s): VITAMINB12, FOLATE, FERRITIN, TIBC, IRON, RETICCTPCT in the last 72 hours. Sepsis Labs: Recent Labs  Lab 11/02/17 1213  LATICACIDVEN 1.26    Recent Results (from the past 240 hour(s))  Urine culture     Status: Abnormal   Collection Time: 10/31/17 11:35 PM  Result Value Ref Range Status   Specimen Description URINE, CLEAN CATCH  Final   Special Requests NONE  Final   Culture (A)  Final    MULTIPLE SPECIES PRESENT, SUGGEST RECOLLECTION NO GROUP B STREP (S.AGALACTIAE) ISOLATED    Report Status 11/02/2017 FINAL  Final         Radiology Studies: No results found.  Scheduled Meds: . metoCLOPramide (REGLAN) injection  5 mg Intravenous Q6H  . oseltamivir  75 mg Oral BID  . prenatal vitamin w/FE, FA  1 tablet Oral Q1200   Continuous Infusions: . dextrose 5 % and 0.45 % NaCl with KCl 10 mEq/L 75 mL/hr at 11/05/17 1054     LOS: 2 days    Time spent: > 35 minutes  Penny Piarlando Vergie Zahm, MD [facility] Pager [phone]  If 7PM-7AM, please contact night-coverage [email or web address] Password [email or web address] 11/05/2017, 11:11 AM

## 2017-11-05 NOTE — Progress Notes (Signed)
Patient tolerating full liquids

## 2017-11-06 LAB — CBC
HEMATOCRIT: 34.3 % — AB (ref 36.0–46.0)
Hemoglobin: 12 g/dL (ref 12.0–15.0)
MCH: 29.3 pg (ref 26.0–34.0)
MCHC: 35 g/dL (ref 30.0–36.0)
MCV: 83.7 fL (ref 78.0–100.0)
PLATELETS: 171 10*3/uL (ref 150–400)
RBC: 4.1 MIL/uL (ref 3.87–5.11)
RDW: 12.7 % (ref 11.5–15.5)
WBC: 4.2 10*3/uL (ref 4.0–10.5)

## 2017-11-06 LAB — BASIC METABOLIC PANEL
Anion gap: 9 (ref 5–15)
BUN: <5 mg/dL — ABNORMAL LOW (ref 6–20)
CO2: 21 mmol/L — ABNORMAL LOW (ref 22–32)
CREATININE: 0.67 mg/dL (ref 0.44–1.00)
Calcium: 8.5 mg/dL — ABNORMAL LOW (ref 8.9–10.3)
Chloride: 105 mmol/L (ref 101–111)
GFR calc Af Amer: >60 mL/min (ref >60)
GLUCOSE: 110 mg/dL — AB (ref 65–99)
POTASSIUM: 2.7 mmol/L — AB (ref 3.5–5.1)
SODIUM: 135 mmol/L (ref 135–145)

## 2017-11-06 MED ORDER — POTASSIUM CHLORIDE CRYS ER 20 MEQ PO TBCR
40.0000 meq | EXTENDED_RELEASE_TABLET | Freq: Two times a day (BID) | ORAL | Status: DC
  Administered 2017-11-06 (×2): 40 meq via ORAL
  Filled 2017-11-06 (×2): qty 2

## 2017-11-06 NOTE — Discharge Summary (Signed)
Physician Discharge Summary  Natalie Riggs ZOX:[id number] DOB: September 18, 1991 DOA: 11/02/2017  PCP: Patient, No Pcp Per  Admit date: 11/02/2017 Discharge date: 11/06/2017  Time spent: > 35 minutes  Recommendations for Outpatient Follow-up:  1. Continue routine prenatal care. Patient did have hyperemesis gravidum while in house 2. Pt refused script for oseltamivir 3. Reassess K levels on f/u    Discharge Diagnoses:  Active Problems:   Nausea and vomiting in pregnancy   Discharge Condition: stable  Diet recommendation: as tolerated  Filed Weights   11/02/17 2140  Weight: 78.2 kg (172 lb 6.4 oz)    History of present illness:  27 y.o.female,gravida 5 para 2,7-week pregnant presenting with 3 days history of fever, chills, body aches, cough and shortness of breath in addition to intractable nausea and vomiting. Patient was diagnosed with the flu and was started on Tamiflu in the emergency room however she had to be admitted because of dehydration and intractable nausea and vomiting  Pt treated in house for flu and hyperemesis gravidum  Hospital Course:  Active Problems:   Nausea and vomiting in pregnancy - Her current symptoms I do not thing are related to influenza as she is more alert and has less coughing on exam. I think this is related to hyperemesis gravidum - improved with scheduled reglan IV. Patient able to eat and requesting discharge today.  Influenza - improved and patient currently does not want any more tamiflu. Offered script on d/c but she refused.   Hypokalemia - administered K dur 40 meq yesterday pt did not tolerate IV replacement. Improved intake as such I suspect number should continue to improve. - recommend rechecking on post hospital follow up  Procedures:  None  Consultations:  Discussed over phone with GYN specialist  Discharge Exam: Vitals:   11/06/17 0616 11/06/17 1025  BP: 120/72 114/68  Pulse: 73 71  Resp: 18 18  Temp: 98.4 F  (36.9 C) (!) 97.5 F (36.4 C)  SpO2: 99% 100%    General: Pt in nad, alert and awake Cardiovascular: no cyanosis Respiratory: no increased wob, no wheezes  Discharge Instructions   Discharge Instructions    Diet - low sodium heart healthy   Complete by:  As directed    Discharge instructions   Complete by:  As directed    Please be sure to follow up with your obgyn after hospital discharge.   Increase activity slowly   Complete by:  As directed      Allergies as of 11/06/2017   No Known Allergies     Medication List    STOP taking these medications   cephALEXin 500 MG capsule Commonly known as:  KEFLEX     TAKE these medications   acetaminophen 500 MG tablet Commonly known as:  TYLENOL Take 500-1,000 mg by mouth every 6 (six) hours as needed for mild pain, fever or headache.   metoCLOPramide 10 MG tablet Commonly known as:  REGLAN Take 1 tablet (10 mg total) by mouth every 8 (eight) hours as needed for nausea.      No Known Allergies    The results of significant diagnostics from this hospitalization (including imaging, microbiology, ancillary and laboratory) are listed below for reference.    Significant Diagnostic Studies: Dg Chest 2 View  Result Date: 11/02/2017 CLINICAL DATA:  Pt c/o cough, SOB, and fever x 11 days. No hx of heart or lung problems. Pt is a former smoker. EXAM: CHEST  2 VIEW COMPARISON:  10/31/2017 FINDINGS: The  heart size and mediastinal contours are within normal limits. Both lungs are clear. The visualized skeletal structures are unremarkable. IMPRESSION: No active cardiopulmonary disease. Electronically Signed   By: Norva Pavlov M.D.   On: 11/02/2017 14:33   Dg Chest 2 View  Result Date: 10/31/2017 CLINICAL DATA:  Nausea, vomiting, diarrhea and generalized abdominal pain today. EXAM: CHEST  2 VIEW COMPARISON:  12/25/2016 FINDINGS: The heart size and mediastinal contours are within normal limits. Both lungs are clear. No pleural  effusion or pneumothorax. The visualized skeletal structures are unremarkable. IMPRESSION: Normal chest radiographs. Electronically Signed   By: Amie Portland M.D.   On: 10/31/2017 21:23   US Ob Comp < 14 Wks  Result Date: 10/31/2017 CLINICAL DATA:  Nausea and vomiting for 2 weeks. Positive pregnancy test. Unknown LMP. Quantitative beta HCG is 115,987. patient refused transvaginal imaging. EXAM: OBSTETRIC <14 WK ULTRASOUND TECHNIQUE: Transabdominal ultrasound was performed for evaluation of the gestation as well as the maternal uterus and adnexal regions. COMPARISON:  None. FINDINGS: Intrauterine gestational sac: A single intrauterine gestational sac is present. Yolk sac:  Yolk sac is present. Embryo:  Fetal pole is identified. Cardiac Activity: Fetal cardiac activity is observed. Heart Rate: 135 bpm CRL:   8.1 mm   6 w 5 d                  [place] EDC: 06/21/2018 Subchorionic hemorrhage:  None visualized. Maternal uterus/adnexae: Uterus is anteverted. Prominent myometrium posteriorly likely representing contraction. No definite myometrial mass lesions. Left ovary is demonstrated and appears normal. Right ovary is not visualized. No free fluid in the pelvis. IMPRESSION: A single intrauterine pregnancy is demonstrated. Estimated gestational age by crown-rump length is 6 weeks 5 days. Electronically Signed   By: Burman Nieves M.D.   On: 10/31/2017 21:50    Microbiology: Recent Results (from the past 240 hour(s))  Urine culture     Status: Abnormal   Collection Time: 10/31/17 11:35 PM  Result Value Ref Range Status   Specimen Description URINE, CLEAN CATCH  Final   Special Requests NONE  Final   Culture (A)  Final    MULTIPLE SPECIES PRESENT, SUGGEST RECOLLECTION NO GROUP B STREP (S.AGALACTIAE) ISOLATED    Report Status 11/02/2017 FINAL  Final     Labs: Basic Metabolic Panel: Recent Labs  Lab 10/31/17 1625 11/02/17 1158 11/03/17 0532 11/05/17 1125 11/06/17 0001  NA 135 136 133* 132* 135  K  3.4* 3.2* 3.1* 2.7* 2.7*  CL 107 102 105 100* 105  CO2 15* 21* 18* 19* 21*  GLUCOSE 109* 107* 97 100* 110*  BUN 10 14 8  5* <5*  CREATININE 0.64 0.70 0.64 0.61 0.67  CALCIUM 9.5 9.2 7.9* 8.2* 8.5*   Liver Function Tests: Recent Labs  Lab 10/31/17 1625 11/02/17 1158 11/05/17 1125  AST 24 31 30   ALT 19 37 43  ALKPHOS 73 63 72  BILITOT 0.8 1.2 1.3*  PROT 8.4* 7.8 6.6  ALBUMIN 4.4 4.1 3.1*   Recent Labs  Lab 10/31/17 1625 11/05/17 1125  LIPASE 26 29   No results for input(s): AMMONIA in the last 168 hours. CBC: Recent Labs  Lab 10/31/17 1625 11/02/17 1158 11/06/17 0001  WBC 9.2 9.6 4.2  NEUTROABS  --  8.6*  --   HGB 13.6 12.3 12.0  HCT 38.3 35.7* 34.3*  MCV 84.9 85.6 83.7  PLT 226 220 171   Cardiac Enzymes: No results for input(s): CKTOTAL, CKMB, CKMBINDEX, TROPONINI in the last 168  hours. BNP: BNP (last 3 results) No results for input(s): BNP in the last 8760 hours.  ProBNP (last 3 results) No results for input(s): PROBNP in the last 8760 hours.  CBG: No results for input(s): GLUCAP in the last 168 hours.     Signed:  Penny Pia MD.  [facility] 11/06/2017, 11:18 AM

## 2017-11-06 NOTE — Care Management Note (Signed)
Case Management Note  Patient Details  Name: Natalie Riggs MRN: [id number] Date of Birth: 02-11-1991  Subjective/Objective:                    Action/Plan: Pt discharged home with self care. Pt has Medicaid and has a PCP assigned to her. She had transportation home. No further needs per CM.  Expected Discharge Date:  11/06/17               Expected Discharge Plan:  Home/Self Care  In-House Referral:     Discharge planning Services     Post Acute Care Choice:    Choice offered to:     DME Arranged:    DME Agency:     HH Arranged:    HH Agency:     Status of Service:  Completed, signed off  If discussed at [employer] Length [employer] Stay Meetings, dates discussed:    Additional Comments:  Kermit BaloKelli F Nile Dorning, RN 11/06/2017, 1:15 PM

## 2017-11-06 NOTE — Progress Notes (Signed)
Nurse went over discharge with patient. Patient Verbalized understanding of this charge. All questions and concern addressed. Patient discharging home, left Ambulatory.

## 2017-11-06 NOTE — Progress Notes (Addendum)
Potassium of 2.7. Dr. Bruna PotterBlount notified with order of tablet potassium.

## 2017-11-06 NOTE — Care Management Note (Signed)
Case Management Note  Patient Details  Name: Raynelle BringJade M Matsuo MRN: [id number] Date of Birth: 02/27/1991  Subjective/Objective:                    Action/Plan: Pt continues with nausea and vomiting. Plan continues to be home when medically ready. CM following.   Expected Discharge Date:                  Expected Discharge Plan:  Home/Self Care  In-House Referral:     Discharge planning Services     Post Acute Care Choice:    Choice offered to:     DME Arranged:    DME Agency:     HH Arranged:    HH Agency:     Status of Service:  In process, will continue to follow  If discussed at Long Length of Stay Meetings, dates discussed:    Additional Comments:  Kermit BaloKelli F Caci Orren, RN 11/06/2017, 11:01 AM

## 2017-12-19 ENCOUNTER — Encounter (HOSPITAL_COMMUNITY): Payer: Self-pay

## 2017-12-19 ENCOUNTER — Emergency Department (HOSPITAL_COMMUNITY)
Admission: EM | Admit: 2017-12-19 | Discharge: 2017-12-20 | Disposition: A | Discharge status: Home / Self Care | Payer: Medicaid Other | Attending: Emergency Medicine | Admitting: Emergency Medicine

## 2017-12-19 ENCOUNTER — Other Ambulatory Visit: Payer: Self-pay

## 2017-12-19 DIAGNOSIS — O219 Vomiting of pregnancy, unspecified: Secondary | ICD-10-CM | POA: Diagnosis not present

## 2017-12-19 DIAGNOSIS — Z5321 Procedure and treatment not carried out due to patient leaving prior to being seen by health care provider: Secondary | ICD-10-CM | POA: Diagnosis not present

## 2017-12-19 DIAGNOSIS — R112 Nausea with vomiting, unspecified: Secondary | ICD-10-CM | POA: Diagnosis not present

## 2017-12-19 DIAGNOSIS — Z3A12 12 weeks gestation of pregnancy: Secondary | ICD-10-CM | POA: Diagnosis not present

## 2017-12-19 LAB — CBC
HCT: 36.4 % (ref 36.0–46.0)
Hemoglobin: 12.9 g/dL (ref 12.0–15.0)
MCH: 30.5 pg (ref 26.0–34.0)
MCHC: 35.4 g/dL (ref 30.0–36.0)
MCV: 86.1 fL (ref 78.0–100.0)
Platelets: 294 10*3/uL (ref 150–400)
RBC: 4.23 MIL/uL (ref 3.87–5.11)
RDW: 13.3 % (ref 11.5–15.5)
WBC: 17.7 10*3/uL — ABNORMAL HIGH (ref 4.0–10.5)

## 2017-12-19 LAB — URINALYSIS, ROUTINE W REFLEX MICROSCOPIC
Glucose, UA: NEGATIVE mg/dL
Hgb urine dipstick: NEGATIVE
Ketones, ur: 80 mg/dL — AB
Nitrite: NEGATIVE
Protein, ur: 100 mg/dL — AB
Specific Gravity, Urine: 1.03 (ref 1.005–1.030)
pH: 6 (ref 5.0–8.0)

## 2017-12-19 LAB — COMPREHENSIVE METABOLIC PANEL
ALT: 15 U/L (ref 14–54)
AST: 18 U/L (ref 15–41)
Albumin: 4.1 g/dL (ref 3.5–5.0)
Alkaline Phosphatase: 65 U/L (ref 38–126)
Anion gap: 12 (ref 5–15)
BUN: 16 mg/dL (ref 6–20)
CO2: 22 mmol/L (ref 22–32)
Calcium: 9.8 mg/dL (ref 8.9–10.3)
Chloride: 102 mmol/L (ref 101–111)
Creatinine, Ser: 0.81 mg/dL (ref 0.44–1.00)
GFR calc Af Amer: >60 mL/min (ref >60)
GFR calc non Af Amer: >60 mL/min (ref >60)
Glucose, Bld: 118 mg/dL — ABNORMAL HIGH (ref 65–99)
Potassium: 3 mmol/L — ABNORMAL LOW (ref 3.5–5.1)
Sodium: 136 mmol/L (ref 135–145)
Total Bilirubin: 1.3 mg/dL — ABNORMAL HIGH (ref 0.3–1.2)
Total Protein: 9 g/dL — ABNORMAL HIGH (ref 6.5–8.1)

## 2017-12-19 LAB — I-STAT BETA HCG BLOOD, ED (MC, WL, AP ONLY): I-stat hCG, quantitative: >2000 m[IU]/mL — ABNORMAL HIGH (ref <5)

## 2017-12-19 LAB — LIPASE, BLOOD: Lipase: 30 U/L (ref 11–51)

## 2017-12-19 NOTE — ED Triage Notes (Signed)
Pt presents to ED with complaints of N/V in pregnancy. Pt states she is ~ [redacted] weeks pregnant but has not received any prenatal care because she plans to abort. Pt reports she has lost about 10 lbs. PT endorses abdominal pain

## 2017-12-19 NOTE — ED Notes (Signed)
No reply for vitals x2 

## 2017-12-20 ENCOUNTER — Other Ambulatory Visit: Payer: Self-pay

## 2017-12-20 ENCOUNTER — Encounter (HOSPITAL_COMMUNITY): Payer: Self-pay

## 2017-12-20 ENCOUNTER — Inpatient Hospital Stay (EMERGENCY_DEPARTMENT_HOSPITAL)
Admission: AD | Admit: 2017-12-20 | Discharge: 2017-12-20 | Disposition: A | Discharge status: Home / Self Care | Payer: Medicaid Other | Source: Ambulatory Visit | Attending: Obstetrics and Gynecology | Admitting: Obstetrics and Gynecology

## 2017-12-20 DIAGNOSIS — E860 Dehydration: Secondary | ICD-10-CM

## 2017-12-20 DIAGNOSIS — O219 Vomiting of pregnancy, unspecified: Secondary | ICD-10-CM | POA: Diagnosis not present

## 2017-12-20 DIAGNOSIS — Z3A12 12 weeks gestation of pregnancy: Secondary | ICD-10-CM | POA: Diagnosis not present

## 2017-12-20 LAB — URINALYSIS, ROUTINE W REFLEX MICROSCOPIC
GLUCOSE, UA: NEGATIVE mg/dL
HGB URINE DIPSTICK: NEGATIVE
KETONES UR: 80 mg/dL — AB
NITRITE: NEGATIVE
PROTEIN: 30 mg/dL — AB
Specific Gravity, Urine: 1.031 — ABNORMAL HIGH (ref 1.005–1.030)
pH: 6 (ref 5.0–8.0)

## 2017-12-20 LAB — CBC
HCT: 38.1 % (ref 36.0–46.0)
Hemoglobin: 13.7 g/dL (ref 12.0–15.0)
MCH: 30.6 pg (ref 26.0–34.0)
MCHC: 36 g/dL (ref 30.0–36.0)
MCV: 85 fL (ref 78.0–100.0)
PLATELETS: 290 10*3/uL (ref 150–400)
RBC: 4.48 MIL/uL (ref 3.87–5.11)
RDW: 13.3 % (ref 11.5–15.5)
WBC: 17.2 10*3/uL — AB (ref 4.0–10.5)

## 2017-12-20 LAB — COMPREHENSIVE METABOLIC PANEL
ALBUMIN: 4.5 g/dL (ref 3.5–5.0)
ALT: 20 U/L (ref 14–54)
ANION GAP: 12 (ref 5–15)
AST: 29 U/L (ref 15–41)
Alkaline Phosphatase: 70 U/L (ref 38–126)
BUN: 27 mg/dL — ABNORMAL HIGH (ref 6–20)
CHLORIDE: 103 mmol/L (ref 101–111)
CO2: 19 mmol/L — ABNORMAL LOW (ref 22–32)
Calcium: 9.8 mg/dL (ref 8.9–10.3)
Creatinine, Ser: 0.73 mg/dL (ref 0.44–1.00)
GFR calc Af Amer: >60 mL/min (ref >60)
GFR calc non Af Amer: >60 mL/min (ref >60)
GLUCOSE: 99 mg/dL (ref 65–99)
POTASSIUM: 3.7 mmol/L (ref 3.5–5.1)
SODIUM: 134 mmol/L — AB (ref 135–145)
TOTAL PROTEIN: 9.2 g/dL — AB (ref 6.5–8.1)
Total Bilirubin: 2.4 mg/dL — ABNORMAL HIGH (ref 0.3–1.2)

## 2017-12-20 MED ORDER — PROMETHAZINE HCL 25 MG PO TABS: 12.5000 mg | ORAL_TABLET | Freq: Four times a day (QID) | ORAL | 0 refills | Status: DC | PRN

## 2017-12-20 MED ORDER — LACTATED RINGERS IV BOLUS (SEPSIS)
1000.0000 mL | Freq: Once | INTRAVENOUS | Status: AC
  Administered 2017-12-20: 1000 mL via INTRAVENOUS

## 2017-12-20 MED ORDER — PROMETHAZINE HCL 25 MG/ML IJ SOLN
25.0000 mg | Freq: Once | INTRAMUSCULAR | Status: AC
  Administered 2017-12-20: 25 mg via INTRAVENOUS
  Filled 2017-12-20: qty 1

## 2017-12-20 NOTE — Discharge Instructions (Signed)
Morning Sickness °Morning sickness is when you feel sick to your stomach (nauseous) during pregnancy. This nauseous feeling may or may not come with vomiting. It often occurs in the morning but can be a problem any time of day. Morning sickness is most common during the first trimester, but it may continue throughout pregnancy. While morning sickness is unpleasant, it is usually harmless unless you develop severe and continual vomiting (hyperemesis gravidarum). This condition requires more intense treatment. °What are the causes? °The cause of morning sickness is not completely known but seems to be related to normal hormonal changes that occur in pregnancy. °What increases the risk? °You are at greater risk if you: °· Experienced nausea or vomiting before your pregnancy. °· Had morning sickness during a previous pregnancy. °· Are pregnant with more than one baby, such as twins. ° °How is this treated? °Do not use any medicines (prescription, over-the-counter, or herbal) for morning sickness without first talking to your health care provider. Your health care provider may prescribe or recommend: °· Vitamin B6 supplements. °· Anti-nausea medicines. °· The herbal medicine ginger. ° °Follow these instructions at home: °· Only take over-the-counter or prescription medicines as directed by your health care provider. °· Taking multivitamins before getting pregnant can prevent or decrease the severity of morning sickness in most women. °· Eat a piece of dry toast or unsalted crackers before getting out of bed in the morning. °· Eat five or six small meals a day. °· Eat dry and bland foods (rice, baked potato). Foods high in carbohydrates are often helpful. °· Do not drink liquids with your meals. Drink liquids between meals. °· Avoid greasy, fatty, and spicy foods. °· Get someone to cook for you if the smell of any food causes nausea and vomiting. °· If you feel nauseous after taking prenatal vitamins, take the vitamins at  night or with a snack. °· Snack on protein foods (nuts, yogurt, cheese) between meals if you are hungry. °· Eat unsweetened gelatins for desserts. °· Wearing an acupressure wristband (worn for sea sickness) may be helpful. °· Acupuncture may be helpful. °· Do not smoke. °· Get a humidifier to keep the air in your house free of odors. °· Get plenty of fresh air. °Contact a health care provider if: °· Your home remedies are not working, and you need medicine. °· You feel dizzy or lightheaded. °· You are losing weight. °Get help right away if: °· You have persistent and uncontrolled nausea and vomiting. °· You pass out (faint). °This information is not intended to replace advice given to you by your health care provider. Make sure you discuss any questions you have with your health care provider. °Document Released: 11/10/2006 Document Revised: 02/25/2016 Document Reviewed: 03/06/2013 °Elsevier Interactive Patient Education © 2017 [employer] ° °

## 2017-12-20 NOTE — MAU Note (Addendum)
G5P3 @ early pregnancy @12 .[redacted] wksga  [facility]. [facility] but did not wait around bc "taking too long" and went home. Called ambulance for n/v this morning. Said SO with her other children. Denies bleeding.   LMP sept 20 Doppler 161  1100: Provider at bs assessing   1220: pt resting quietly with eyes closed. IV bolus done.   1315: provider tolerating PO drink.   1328: woke pt up to inform her to call for her ride to get here.   1334: d/c instructions given with pt understanding. Pt left unit via ambulatory.

## 2017-12-20 NOTE — MAU Provider Note (Signed)
History     CSN: [id number]  Arrival date and time: 12/20/17 1016   First Provider Initiated Contact with Patient 12/20/17 1100      Chief Complaint  Patient presents with  . Emesis  . Nausea   G5P2012 @12 .6 wks here with N/V x3 days. She cannot tolerate anything po. No diarrhea. No sick contacts. No fevers. She was admitted last month for Influenza and Hyperemesis and was given Rx for Reglan but never picked it up. She hasn't tried any antinausea meds.    OB History    Gravida Para Term Preterm AB Living   5 2 2   1 2    SAB TAB Ectopic Multiple Live Births   1       2      Past Medical History:  Diagnosis Date  . Miscarriage     Past Surgical History:  Procedure Laterality Date  . DILATATION & CURETTAGE/HYSTEROSCOPY WITH MYOSURE      Family History  Problem Relation Age of Onset  . Asthma Brother     Social History   Tobacco Use  . Smoking status: Former Smoker    Packs/day: 0.00  . Smokeless tobacco: Never Used  Substance Use Topics  . Alcohol use: No  . Drug use: No    Allergies: No Known Allergies  No medications prior to admission.    Review of Systems  Constitutional: Negative for fever.  Gastrointestinal: Positive for nausea and vomiting. Negative for abdominal pain, constipation and diarrhea.  Genitourinary: Positive for enuresis. Negative for vaginal bleeding.   Physical Exam   Blood pressure 138/72, pulse 86, temperature (!) 97.5 F (36.4 C), temperature source Oral, resp. rate 17, height 5\' 7"  (1.702 m), weight 158 lb 14.4 oz (72.1 kg), last menstrual period 09/21/2017, SpO2 100 %.  Physical Exam  Constitutional: She is oriented to person, place, and time. She appears well-developed and well-nourished. She has a sickly appearance. No distress.  HENT:  Head: Normocephalic and atraumatic.  Neck: Normal range of motion.  Cardiovascular: Normal rate.  Respiratory: Effort normal. No respiratory distress.  GI: Soft. She exhibits no  distension and no mass. There is tenderness (diffuse, all quadrants). There is no rebound and no guarding.  Musculoskeletal: Normal range of motion.  Neurological: She is alert and oriented to person, place, and time.  Skin: Skin is warm and dry.  Psychiatric: She has a normal mood and affect.   Results for orders placed or performed during the hospital encounter of 12/20/17 (from the past 24 hour(s))  CBC     Status: Abnormal   Collection Time: 12/20/17 11:15 AM  Result Value Ref Range   WBC 17.2 (H) 4.0 - 10.5 K/uL   RBC 4.48 3.87 - 5.11 MIL/uL   Hemoglobin 13.7 12.0 - 15.0 g/dL   HCT 78.2 95.6 - 21.3 %   MCV 85.0 78.0 - 100.0 fL   MCH 30.6 26.0 - 34.0 pg   MCHC 36.0 30.0 - 36.0 g/dL   RDW 08.6 57.8 - 46.9 %   Platelets 290 150 - 400 K/uL  Comprehensive metabolic panel     Status: Abnormal   Collection Time: 12/20/17 11:15 AM  Result Value Ref Range   Sodium 134 (L) 135 - 145 mmol/L   Potassium 3.7 3.5 - 5.1 mmol/L   Chloride 103 101 - 111 mmol/L   CO2 19 (L) 22 - 32 mmol/L   Glucose, Bld 99 65 - 99 mg/dL   BUN 27 (H) 6 -  20 mg/dL   Creatinine, Ser 1.610.73 0.44 - 1.00 mg/dL   Calcium 9.8 8.9 - 09.610.3 mg/dL   Total Protein 9.2 (H) 6.5 - 8.1 g/dL   Albumin 4.5 3.5 - 5.0 g/dL   AST 29 15 - 41 U/L   ALT 20 14 - 54 U/L   Alkaline Phosphatase 70 38 - 126 U/L   Total Bilirubin 2.4 (H) 0.3 - 1.2 mg/dL   GFR calc non Af Amer >60 >60 mL/min   GFR calc Af Amer >60 >60 mL/min   Anion gap 12 5 - 15  Urinalysis, Routine w reflex microscopic     Status: Abnormal   Collection Time: 12/20/17  1:45 PM  Result Value Ref Range   Color, Urine AMBER (A) YELLOW   APPearance HAZY (A) CLEAR   Specific Gravity, Urine 1.031 (H) 1.005 - 1.030   pH 6.0 5.0 - 8.0   Glucose, UA NEGATIVE NEGATIVE mg/dL   Hgb urine dipstick NEGATIVE NEGATIVE   Bilirubin Urine SMALL (A) NEGATIVE   Ketones, ur 80 (A) NEGATIVE mg/dL   Protein, ur 30 (A) NEGATIVE mg/dL   Nitrite NEGATIVE NEGATIVE   Leukocytes, UA  MODERATE (A) NEGATIVE   RBC / HPF 0-5 0 - 5 RBC/hpf   WBC, UA 6-30 0 - 5 WBC/hpf   Bacteria, UA RARE (A) NONE SEEN   Squamous Epithelial / LPF 6-30 (A) NONE SEEN   Mucus PRESENT    Hyaline Casts, UA PRESENT     MAU Course  Procedures LR Phenergan  MDM Labs ordered and reviewed. Tolerating po fluids. No emesis. Stable for discharge home.  Assessment and Plan   1. [redacted] weeks gestation of pregnancy   2. Nausea/vomiting in pregnancy    Discharge home Follow up in [facility] to start care Rx Phenergan  Allergies as of 12/20/2017   No Known Allergies     Medication List    STOP taking these medications   metoCLOPramide 10 MG tablet Commonly known as:  REGLAN     TAKE these medications   acetaminophen 500 MG tablet Commonly known as:  TYLENOL Take 500-1,000 mg by mouth every 6 (six) hours as needed for mild pain, fever or headache.   promethazine 25 MG tablet Commonly known as:  PHENERGAN Take 0.5-1 tablets (12.5-25 mg total) by mouth every 6 (six) hours as needed for nausea or vomiting.       Donette LarryMelanie Roshanda Balazs, CNM 12/20/2017, 2:42 PM

## 2018-01-30 ENCOUNTER — Encounter (HOSPITAL_COMMUNITY): Payer: Self-pay | Admitting: *Deleted

## 2018-01-30 ENCOUNTER — Inpatient Hospital Stay (HOSPITAL_COMMUNITY)
Admission: AD | Admit: 2018-01-30 | Discharge: 2018-01-30 | Disposition: A | Discharge status: Home / Self Care | Payer: Medicaid Other | Source: Ambulatory Visit | Attending: Obstetrics and Gynecology | Admitting: Obstetrics and Gynecology

## 2018-01-30 DIAGNOSIS — O210 Mild hyperemesis gravidarum: Secondary | ICD-10-CM | POA: Insufficient documentation

## 2018-01-30 DIAGNOSIS — Z87891 Personal history of nicotine dependence: Secondary | ICD-10-CM | POA: Diagnosis not present

## 2018-01-30 DIAGNOSIS — Z3A18 18 weeks gestation of pregnancy: Secondary | ICD-10-CM | POA: Diagnosis not present

## 2018-01-30 LAB — URINALYSIS, ROUTINE W REFLEX MICROSCOPIC
BILIRUBIN URINE: NEGATIVE
Glucose, UA: NEGATIVE mg/dL
HGB URINE DIPSTICK: NEGATIVE
KETONES UR: 80 mg/dL — AB
NITRITE: NEGATIVE
PH: 6 (ref 5.0–8.0)
Protein, ur: 100 mg/dL — AB
SPECIFIC GRAVITY, URINE: 1.031 — AB (ref 1.005–1.030)

## 2018-01-30 MED ORDER — M.V.I. ADULT IV INJ
Freq: Once | INTRAVENOUS | Status: AC
  Administered 2018-01-30: 10:00:00 via INTRAVENOUS
  Filled 2018-01-30: qty 1000

## 2018-01-30 MED ORDER — ONDANSETRON 8 MG PO TBDP: 8.0000 mg | ORAL_TABLET | Freq: Three times a day (TID) | ORAL | 3 refills | Status: DC | PRN

## 2018-01-30 MED ORDER — DEXTROSE 5 % IN LACTATED RINGERS IV BOLUS: 1000.0000 mL | Freq: Once | INTRAVENOUS | Status: DC

## 2018-01-30 MED ORDER — PROMETHAZINE HCL 25 MG PO TABS: 12.5000 mg | ORAL_TABLET | Freq: Four times a day (QID) | ORAL | 3 refills | Status: DC | PRN

## 2018-01-30 MED ORDER — SODIUM CHLORIDE 0.9 % IV SOLN
8.0000 mg | Freq: Once | INTRAVENOUS | Status: AC
  Administered 2018-01-30: 8 mg via INTRAVENOUS
  Filled 2018-01-30: qty 4

## 2018-01-30 MED ORDER — PROMETHAZINE HCL 25 MG/ML IJ SOLN
25.0000 mg | Freq: Once | INTRAVENOUS | Status: AC
  Administered 2018-01-30: 25 mg via INTRAVENOUS
  Filled 2018-01-30: qty 1

## 2018-01-30 NOTE — Discharge Instructions (Signed)
Eating Plan for Hyperemesis Gravidarum °Hyperemesis gravidarum is a severe form of morning sickness. Because this condition causes severe nausea and vomiting, it can lead to dehydration, malnutrition, and weight loss. One way to lessen the symptoms of nausea and vomiting is to follow the eating plan for hyperemesis gravidarum. It is often used along with prescribed medicines to control your symptoms. °What can I do to relieve my symptoms? °Listen to your body. Everyone is different and has different preferences. Find what works best for you. Take any of the following actions that are helpful to you: °· Eat and drink slowly. °· Eat 5-6 small meals daily instead of 3 large meals. °· Eat crackers before you get out of bed in the morning. °· Try having a snack in the middle of the night. °· Starchy foods are usually tolerated well. Examples include cereal, toast, bread, potatoes, pasta, rice, and pretzels. °· Ginger may help with nausea. Add ¼ tsp ground ginger to hot tea or choose ginger tea. °· Try drinking 100% fruit juice or an electrolyte drink. An electrolyte drink contains sodium, potassium, and chloride. °· Continue to take your prenatal vitamins as told by your health care provider. If you are having trouble taking your prenatal vitamins, talk with your health care provider about different options. °· Include at least 1 serving of protein with your meals and snacks. Protein options include meats or poultry, beans, nuts, eggs, and yogurt. Try eating a protein-rich snack before bed. Examples of these snacks include cheese and crackers or half of a peanut butter or turkey sandwich. °· Consider eliminating foods that trigger your symptoms. These may include spicy foods, coffee, high-fat foods, very sweet foods, and acidic foods. °· Try meals that have more protein combined with bland, salty, lower-fat, and dry foods, such as nuts, seeds, pretzels, crackers, and cereal. °· Talk with your healthcare provider about  starting a supplement of vitamin B6. °· Have fluids that are cold, clear, and carbonated or sour. Examples include lemonade, ginger ale, lemon-lime soda, ice water, and sparkling water. °· Try lemon or mint tea. °· Try brushing your teeth or using a mouth rinse after meals. ° °What should I avoid to reduce my symptoms? °Avoiding some of the following things may help reduce your symptoms. °· Foods with strong smells. Try eating meals in well-ventilated areas that are free of odors. °· Drinking water or other beverages with meals. Try not to drink anything during the 30 minutes before and after your meals. °· Drinking more than 1 cup of fluid at a time. Sometimes using a straw helps. °· Fried or high-fat foods, such as butter and cream sauces. °· Spicy foods. °· Skipping meals as best as you can. Nausea can be more intense on an empty stomach. If you cannot tolerate food at that time, do not force it. Try sucking on ice chips or other frozen items, and make up for missed calories later. °· Lying down within 2 hours after eating. °· Environmental triggers. These may include smoky rooms, closed spaces, rooms with strong smells, warm or humid places, overly loud and noisy rooms, and rooms with motion or flickering lights. °· Quick and sudden changes in your movement. ° °This information is not intended to replace advice given to you by your health care provider. Make sure you discuss any questions you have with your health care provider. °Document Released: 07/17/2007 Document Revised: 05/18/2016 Document Reviewed: 04/19/2016 °Elsevier Interactive Patient Education © 2018 [employer]. ° ° °Hyperemesis Gravidarum °  Hyperemesis gravidarum is a severe form of nausea and vomiting that happens during pregnancy. Hyperemesis is worse than morning sickness. It may cause you to have nausea or vomiting all day for many days. It may keep you from eating and drinking enough food and liquids. Hyperemesis usually occurs during the  first half (the first 20 weeks) of pregnancy. It often goes away once a woman is in her second half of pregnancy. However, sometimes hyperemesis continues through an entire pregnancy. °What are the causes? °The cause of this condition is not known. It may be related to changes in chemicals (hormones) in the body during pregnancy, such as the high level of pregnancy hormone (human chorionic gonadotropin) or the increase in the female sex hormone (estrogen). °What are the signs or symptoms? °Symptoms of this condition include: °· Severe nausea and vomiting. °· Nausea that does not go away. °· Vomiting that does not allow you to keep any food down. °· Weight loss. °· Body fluid loss (dehydration). °· Having no desire to eat, or not liking food that you have previously enjoyed. ° °How is this diagnosed? °This condition may be diagnosed based on: °· A physical exam. °· Your medical history. °· Your symptoms. °· Blood tests. °· Urine tests. ° °How is this treated? °This condition may be managed with medicine. If medicines to do not help relieve nausea and vomiting, you may need to receive fluids through an IV tube at the hospital. °Follow these instructions at home: °· Take over-the-counter and prescription medicines only as told by your health care provider. °· Avoid iron pills and multivitamins that contain iron for the first 3-4 months of pregnancy. If you take prescription iron pills, do not stop taking them unless your health care provider approves. °· Take the following actions to help prevent nausea and vomiting: °? In the morning, before getting out of bed, try eating a couple of dry crackers or a piece of toast. °? Avoid foods and smells that upset your stomach. Fatty and spicy foods may make nausea worse. °? Eat 5-6 small meals a day. °? Do not drink fluids while eating meals. Drink between meals. °? Eat or suck on things that have ginger in them. Ginger can help relieve nausea. °? Avoid food preparation. The  smell of food can spoil your appetite or trigger nausea. °· Follow instructions from your health care provider about eating or drinking restrictions. °· For snacks, eat high-protein foods, such as cheese. °· Keep all follow-up and pre-birth (prenatal) visits as told by your health care provider. This is important. °Contact a health care provider if: °· You have pain in your abdomen. °· You have a severe headache. °· You have vision problems. °· You are losing weight. °Get help right away if: °· You cannot drink fluids without vomiting. °· You vomit blood. °· You have constant nausea and vomiting. °· You are very weak. °· You are very thirsty. °· You feel dizzy. °· You faint. °· You have a fever or other symptoms that last for more than 2-3 days. °· You have a fever and your symptoms suddenly get worse. °Summary °· Hyperemesis gravidarum is a severe form of nausea and vomiting that happens during pregnancy. °· Making some changes to your eating habits may help relieve nausea and vomiting. °· This condition may be managed with medicine. °· If medicines to do not help relieve nausea and vomiting, you may need to receive fluids through an IV tube at the hospital. °This information   is not intended to replace advice given to you by your health care provider. Make sure you discuss any questions you have with your health care provider. °Document Released: 09/19/2005 Document Revised: 05/18/2016 Document Reviewed: 05/18/2016 °Elsevier Interactive Patient Education © 2017 Elsevier Inc. ° °

## 2018-01-30 NOTE — MAU Note (Signed)
PT  HAS ARRIVED VIA EMS-    FOR VOMITING -  STARTED  YESTERDAY.    NO MEDS  AFTER MN.   YESTERDAY  SHE TOOK PHENERGAN AND  REGLAN.     PNC WITH  CLINIC- BUT HAS NOT BEEN - WAITING ON MEDICAID     LAST    DRANK -   10 MIN AGO.

## 2018-01-30 NOTE — MAU Provider Note (Addendum)
History     CSN: [id number]  Arrival date and time: 01/30/18 4098   First Provider Initiated Contact with Patient 01/30/18 [phone]      Chief Complaint  Patient presents with  . Emesis   HPI  Ms.  Natalie Riggs is a 27 y.o. year old G75P2012 female at [redacted]w[redacted]d weeks gestation who presents to MAU via EMS reporting vomiting that started yesterday. She called to EMS to her house earlier She took Phenergan and Reglan. She plans to get [facility] at [facility], but "waiting on Medicaid". She last drank H2O 10 mins ago. She has currently vomited that H2O up x 2 episodes.   Past Medical History:  Diagnosis Date  . Miscarriage     Past Surgical History:  Procedure Laterality Date  . DILATATION & CURETTAGE/HYSTEROSCOPY WITH MYOSURE      Family History  Problem Relation Age of Onset  . Asthma Brother     Social History   Tobacco Use  . Smoking status: Former Smoker    Packs/day: 0.00  . Smokeless tobacco: Never Used  Substance Use Topics  . Alcohol use: No  . Drug use: No    Allergies: No Known Allergies  Medications Prior to Admission  Medication Sig Dispense Refill Last Dose  . acetaminophen (TYLENOL) 500 MG tablet Take 500-1,000 mg by mouth every 6 (six) hours as needed for mild pain, fever or headache.    Past Week at Unknown time  . promethazine (PHENERGAN) 25 MG tablet Take 0.5-1 tablets (12.5-25 mg total) by mouth every 6 (six) hours as needed for nausea or vomiting. 30 tablet 0 01/29/2018 at Unknown time    Review of Systems  Constitutional: Negative.   HENT: Negative.   Eyes: Negative.   Respiratory: Negative.   Cardiovascular: Negative.   Gastrointestinal: Positive for abdominal pain, nausea and vomiting.  Endocrine: Negative.   Genitourinary: Negative.   Musculoskeletal: Negative.   Skin: Negative.   Allergic/Immunologic: Negative.   Neurological: Negative.   Hematological: Negative.   Psychiatric/Behavioral: Negative.    Physical Exam   Blood pressure 132/74,  pulse 89, temperature 97.8 F (36.6 C), temperature source Oral, resp. rate (!) 22, height  (1.702 m), weight 165 lb (74.8 kg), last menstrual period 09/21/2017.  Physical Exam  Nursing note and vitals reviewed. Constitutional: She is oriented to person, place, and time. She appears well-developed and well-nourished.  HENT:  Head: Normocephalic and atraumatic.  Eyes: Pupils are equal, round, and reactive to light.  Neck: Normal range of motion.  Cardiovascular: Normal rate, regular rhythm and normal heart sounds.  Respiratory: Effort normal and breath sounds normal.  GI: Soft. Bowel sounds are normal.  Genitourinary:  Genitourinary Comments: Pelvic deferred  Musculoskeletal: Normal range of motion.  Neurological: She is alert and oriented to person, place, and time.  Skin: Skin is warm and dry.  Psychiatric: She has a normal mood and affect. Her behavior is normal. Judgment and thought content normal.    MAU Course  Procedures  MDM CCUA IVFs: Phenergan 25 mg LR 1000 ml @ 999 ml/hr; then MVI in LR 1000 ml @ 500 ml/hr   Results for orders placed or performed during the hospital encounter of 01/30/18 (from the past 24 hour(s))  Urinalysis, Routine w reflex microscopic     Status: Abnormal   Collection Time: 01/30/18  5:20 AM  Result Value Ref Range   Color, Urine AMBER (A) YELLOW   APPearance CLOUDY (A) CLEAR   Specific Gravity, Urine 1.031 (H)  1.005 - 1.030   pH 6.0 5.0 - 8.0   Glucose, UA NEGATIVE NEGATIVE mg/dL   Hgb urine dipstick NEGATIVE NEGATIVE   Bilirubin Urine NEGATIVE NEGATIVE   Ketones, ur 80 (A) NEGATIVE mg/dL   Protein, ur 161 (A) NEGATIVE mg/dL   Nitrite NEGATIVE NEGATIVE   Leukocytes, UA LARGE (A) NEGATIVE   RBC / HPF 0-5 0 - 5 RBC/hpf   WBC, UA 21-50 0 - 5 WBC/hpf   Bacteria, UA RARE (A) NONE SEEN   Squamous Epithelial / LPF 21-50 0 - 5   Mucus PRESENT     Report given to and care assumed by Ivonne Andrew, CNM @ [phone]  Raelyn Mora, MSN,  CNM 01/30/2018, 5:28 AM   Pt still vomiting after phenergan. Zofran ordered.  No further vomiting. Tolerating Po fluids.   MDM Hyperemesis controlled w/ Zofran.  Assessment and Plan   1. Hyperemesis affecting pregnancy, antepartum    D/C home in stable condition. Zofran and phenergan in schedule Advance diet slowly Follow-up Information    [facility] for [facility]. Call.   Specialty:  Obstetrics and Gynecology Why:  to schedule New OB appointment. You do not need to wait until your Pregnancy Medicaid is active. Contact information: [address] [place] [address] [phone]       [facility] [place] MATERNITY ADMISSIONS Follow up.   Why:  as needed in emergencies Contact information: [address] [phone] mc [place] [address] [phone]         Allergies as of 01/30/2018   No Known Allergies     Medication List    TAKE these medications   acetaminophen 500 MG tablet Commonly known as:  TYLENOL Take 500-1,000 mg by mouth every 6 (six) hours as needed for mild pain, fever or headache.   ondansetron 8 MG disintegrating tablet Commonly known as:  ZOFRAN ODT Take 1 tablet (8 mg total) by mouth every 8 (eight) hours as needed for nausea or vomiting.   promethazine 25 MG tablet Commonly known as:  PHENERGAN Take 0.5-1 tablets (12.5-25 mg total) by mouth every 6 (six) hours as needed for nausea or vomiting.      Katrinka Blazing, [place], CNM 01/30/2018 3:22 PM

## 2018-02-18 IMAGING — US US OB COMP LESS 14 WK
1 series · 14 of 28 positions shown · non-contrast
Comparison: 08/18/2016 pelvic ultrasound

CLINICAL DATA: Vaginal bleeding and cramping.

EXAM:
OBSTETRIC <14 WK US AND TRANSVAGINAL OB US
TECHNIQUE: Both transabdominal and transvaginal ultrasound examinations were
performed for complete evaluation of the gestation as well as the
maternal uterus, adnexal regions, and pelvic cul-de-sac.
Transvaginal technique was performed to assess early pregnancy.

[Series 1: us ob comp less 14 wk · 0.23mm/px · 14 of 107 slices shown]
[im 4/107]
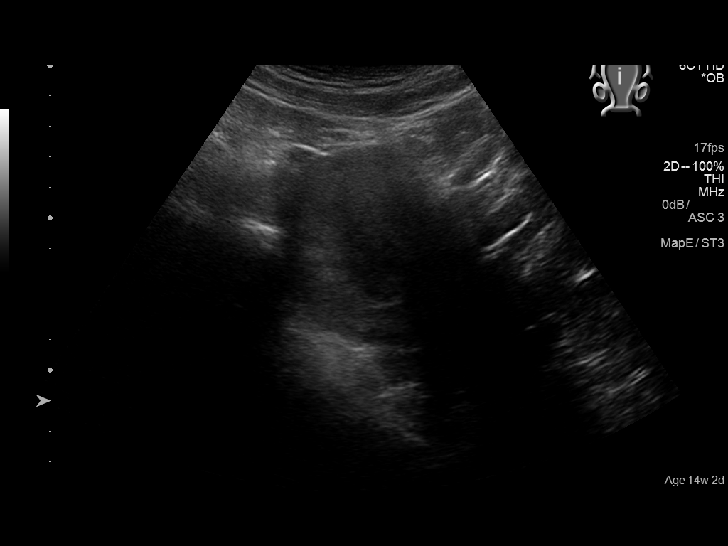
[im 12/107]
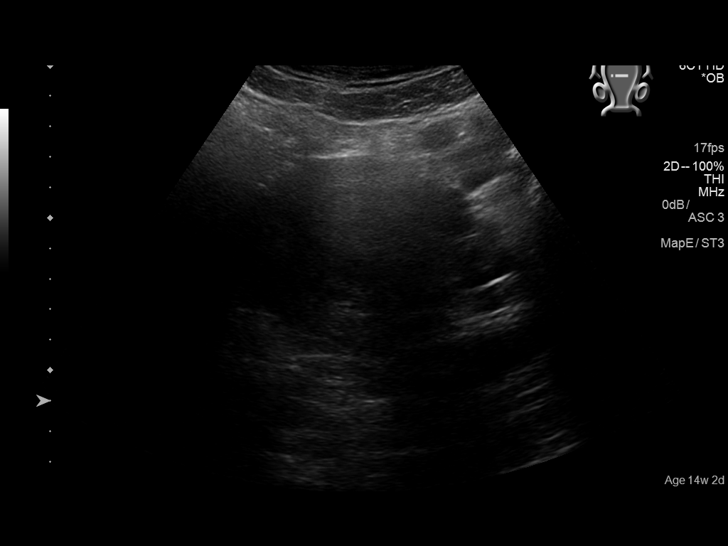
[im 20/107]
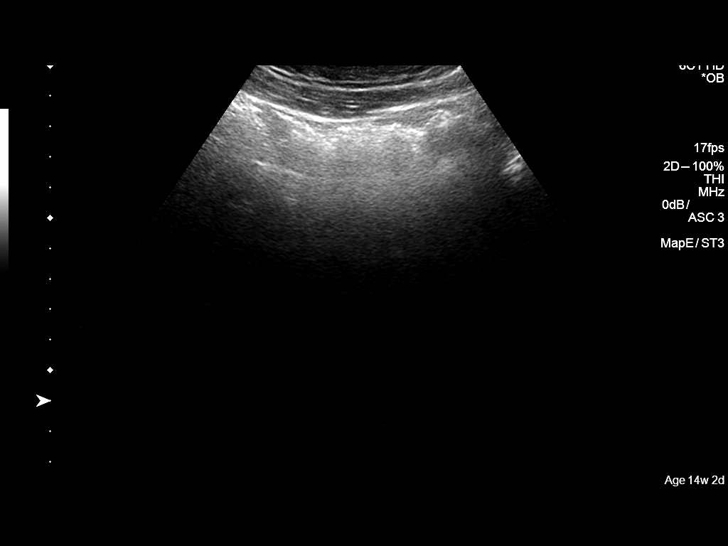
[im 28/107]
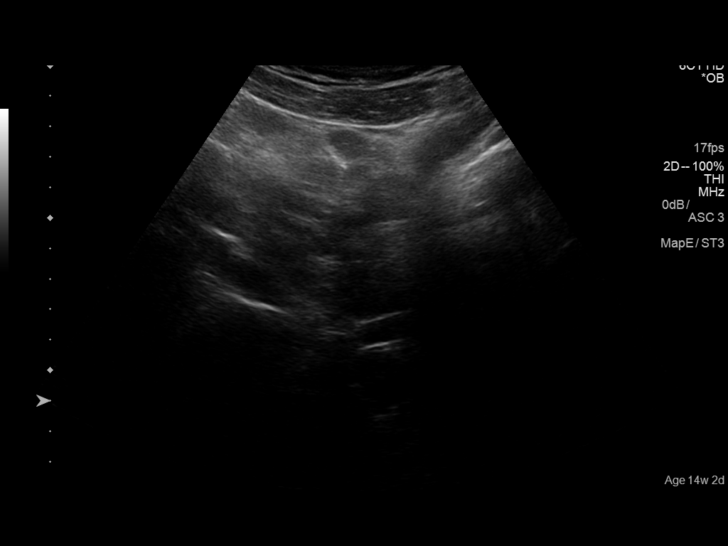
[im 36/107]
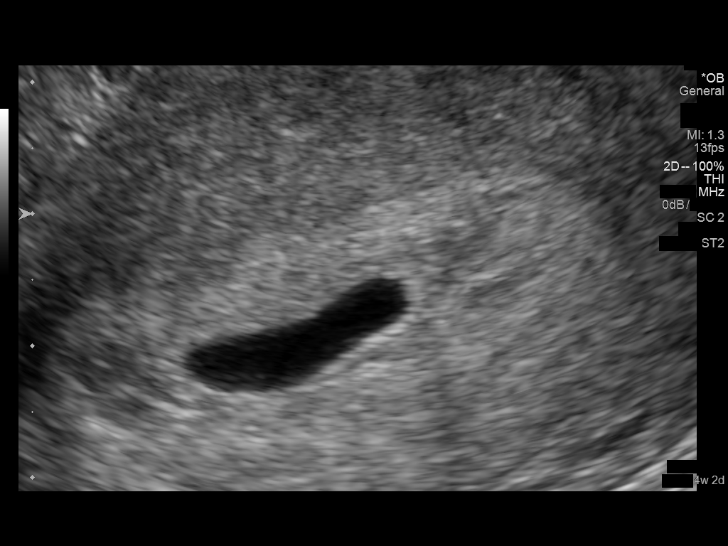
[im 44/107]
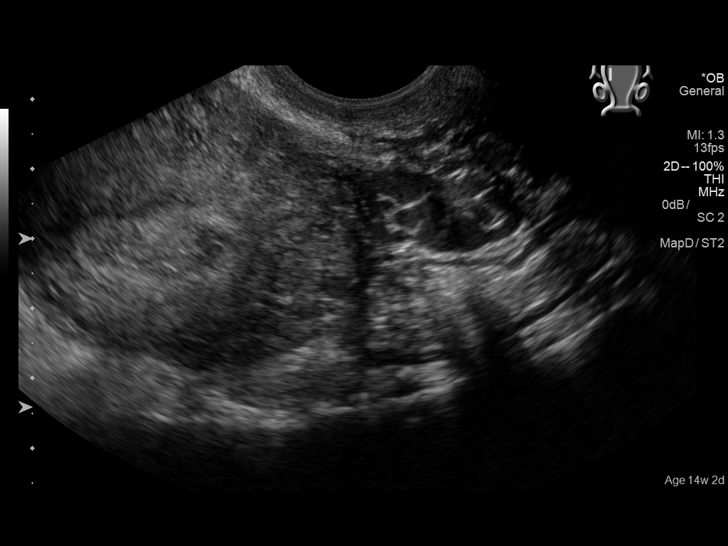
[im 52/107]
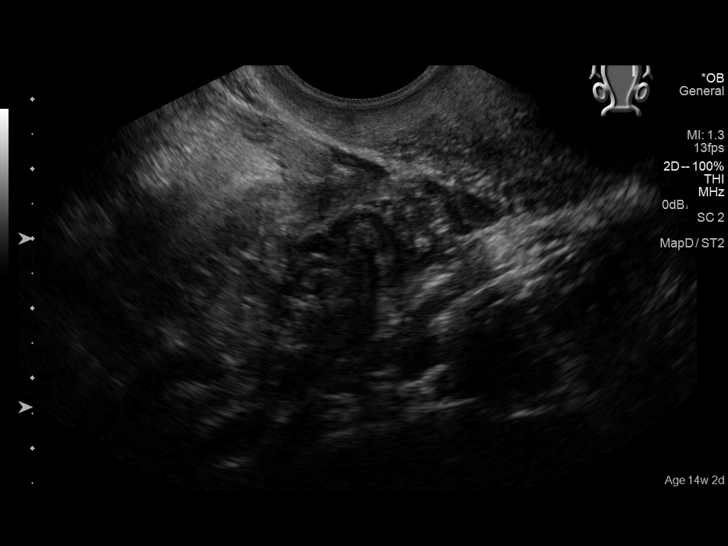
[im 59/107]
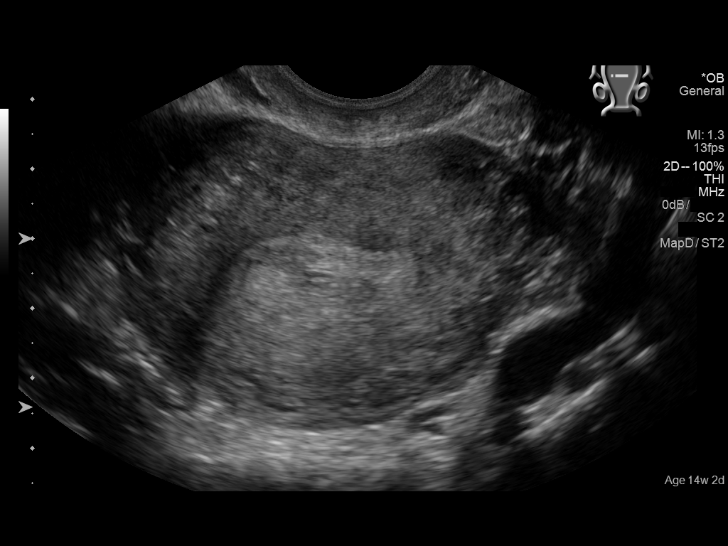
[im 67/107]
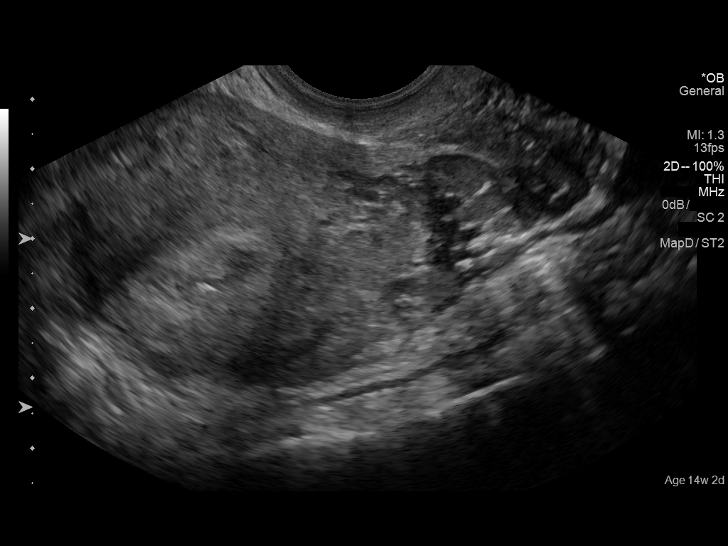
[im 75/107]
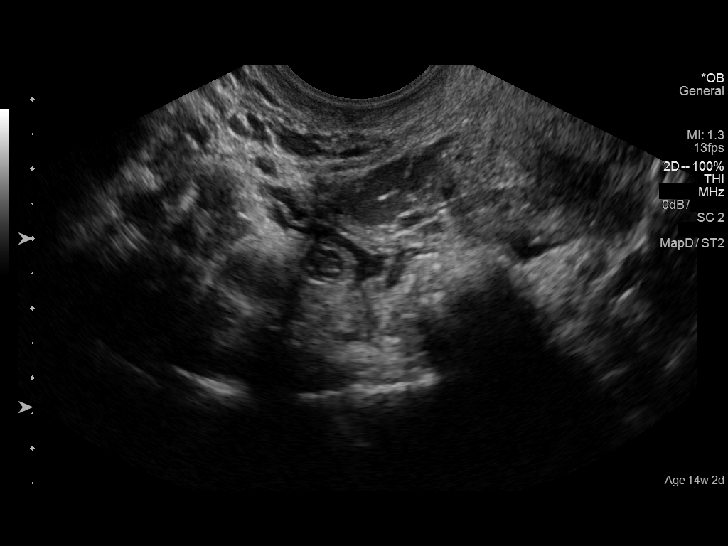
[im 83/107]
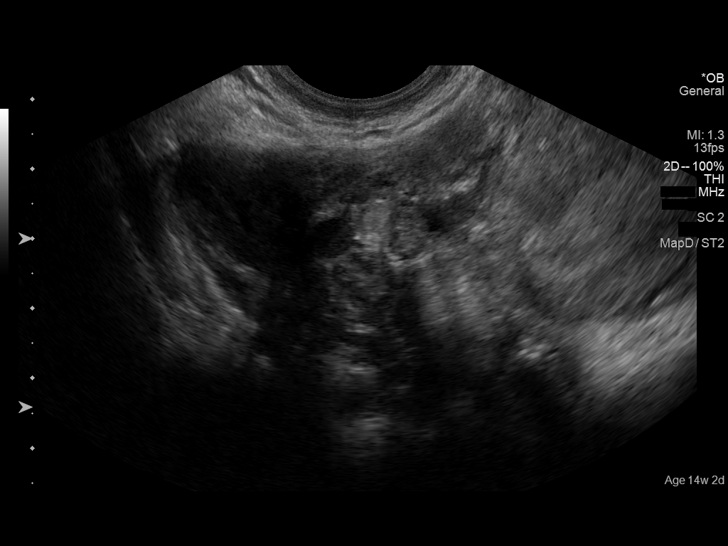
[im 91/107]
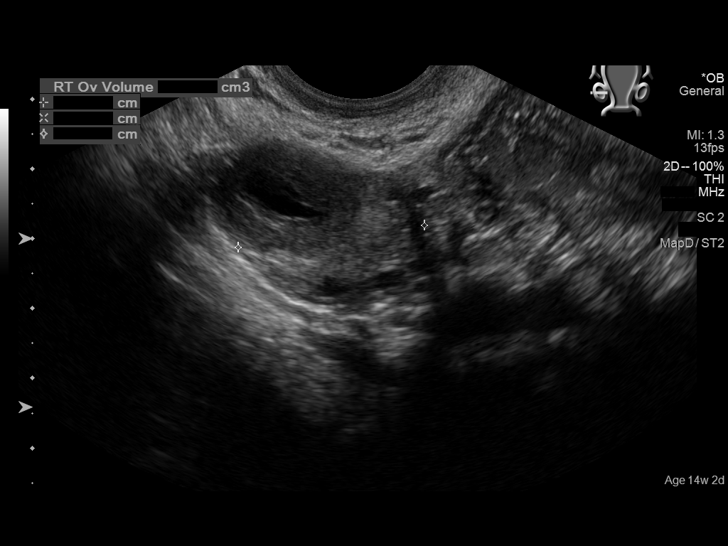
[im 99/107]
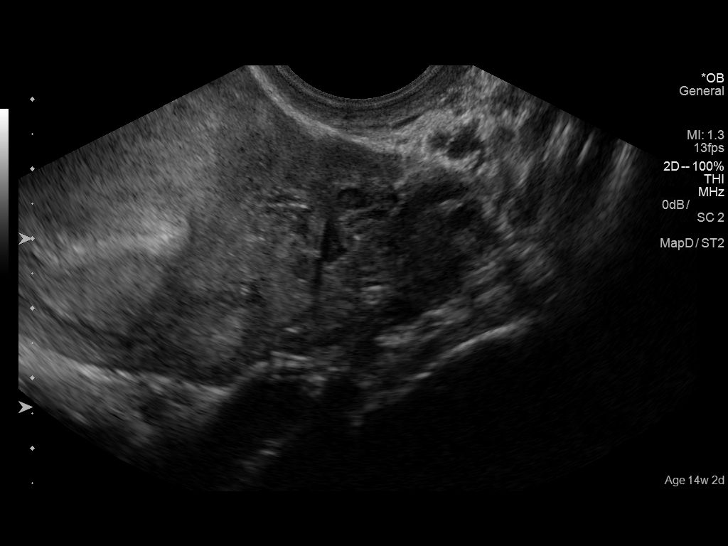
[im 107/107]
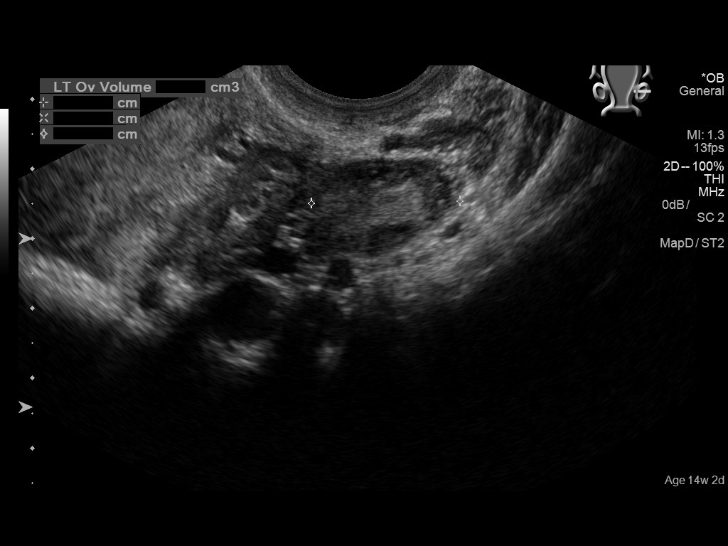

[14 of 28 positions shown; findings below may reference images not displayed]

FINDINGS: Intrauterine gestational sac: Single.

Yolk sac:  Visualized.

Embryo:  Not visualized

MSD: 11.3  mm   5 w   6  d

Subchorionic hemorrhage:  None visualized.

Maternal uterus/adnexae: No adnexal mass. Ovaries measure 3.5 x
x 2.7 cm on the right and 2.9 x 1.6 x 2.1 cm on the left. No free
fluid.
IMPRESSION: Early intrauterine gestation with a yolk sac present but no fetal
pole yet identified.

## 2018-03-22 ENCOUNTER — Encounter: Payer: Self-pay | Admitting: Student

## 2018-03-22 ENCOUNTER — Ambulatory Visit (INDEPENDENT_AMBULATORY_CARE_PROVIDER_SITE_OTHER): Payer: Medicaid Other | Admitting: Student

## 2018-03-22 ENCOUNTER — Other Ambulatory Visit: Payer: Self-pay

## 2018-03-22 VITALS — BP 117/60 | HR 91 | Wt 177.0 lb

## 2018-03-22 DIAGNOSIS — Z3480 Encounter for supervision of other normal pregnancy, unspecified trimester: Secondary | ICD-10-CM

## 2018-03-22 DIAGNOSIS — Z3482 Encounter for supervision of other normal pregnancy, second trimester: Secondary | ICD-10-CM

## 2018-03-22 NOTE — Patient Instructions (Signed)

## 2018-03-24 LAB — CULTURE, OB URINE

## 2018-03-24 LAB — URINE CULTURE, OB REFLEX: Organism ID, Bacteria: NO GROWTH

## 2018-03-24 NOTE — Progress Notes (Signed)
  Subjective:    Natalie Riggs is being seen today for her first obstetrical visit.  This is not a planned pregnancy. She is at 2335w2d gestation. Her obstetrical history is significant for late to prenatal care; grand multiparity. . Relationship with FOB: significant other, living together. Patient does intend to breast feed. Pregnancy history fully reviewed.  Patient reports no complaints.  Review of Systems:   Review of Systems  Constitutional: Negative.   HENT: Negative.   Respiratory: Negative.   Cardiovascular: Negative.   Gastrointestinal: Negative.   Genitourinary: Negative.   Musculoskeletal: Negative.   Neurological: Negative.   Hematological: Negative.     Objective:     BP 117/60   Pulse 91   Wt 177 lb (80.3 kg)   LMP 09/21/2017   BMI 27.72 kg/m  Physical Exam  Constitutional: She appears well-developed.  HENT:  Head: Normocephalic.  Neck: Normal range of motion.  Cardiovascular: Normal rate.  Respiratory: Effort normal.  GI: Soft.  Musculoskeletal: Normal range of motion.  Neurological: She is alert.  Skin: Skin is warm.    Exam    Assessment:    Pregnancy: [id number] Patient Active Problem List   Diagnosis Date Noted  . Nausea and vomiting in pregnancy 11/02/2017  . Post-dates pregnancy 07/10/2017  . Electrolyte disturbance 05/03/2017  . Anemia of pregnancy 05/03/2017  . Supervision of other normal pregnancy, antepartum 01/24/2017  . Hyperemesis affecting pregnancy, antepartum 12/25/2016       Plan:     Initial labs drawn. Prenatal vitamins. Problem list reviewed and updated. AFP3 discussed: wil order at next visit. Role of ultrasound in pregnancy discussed; fetal survey: ordered. Amniocentesis discussed: not indicated.  50% of 40 min visit spent on counseling and coordination of care.  -Patient cannot stay for lab work; plans to return in two weeks. Will do NOB labs, genetic testing and 2 hour gtt at that time.  -Pap smear at next visit  (28 week visit) or PP; cannot find documentation requested last year for [facility].  -All questions answered; patient planning to keep regular prenatal visits.  Natalie GaribaldiKathryn Lorraine Cameron [facility] CenterKooistra 03/24/2018

## 2018-03-26 ENCOUNTER — Encounter (HOSPITAL_COMMUNITY): Payer: Self-pay

## 2018-03-28 ENCOUNTER — Other Ambulatory Visit (HOSPITAL_COMMUNITY): Payer: Medicaid Other

## 2018-03-29 ENCOUNTER — Other Ambulatory Visit: Payer: Self-pay | Admitting: *Deleted

## 2018-03-29 DIAGNOSIS — Z3480 Encounter for supervision of other normal pregnancy, unspecified trimester: Secondary | ICD-10-CM

## 2018-04-02 ENCOUNTER — Other Ambulatory Visit: Payer: Medicaid Other

## 2018-04-02 ENCOUNTER — Other Ambulatory Visit: Payer: Self-pay | Admitting: Student

## 2018-04-02 ENCOUNTER — Ambulatory Visit (HOSPITAL_COMMUNITY)
Admission: RE | Admit: 2018-04-02 | Discharge: 2018-04-02 | Disposition: A | Discharge status: Home / Self Care | Payer: Medicaid Other | Source: Ambulatory Visit | Attending: Student | Admitting: Student

## 2018-04-02 DIAGNOSIS — Z3483 Encounter for supervision of other normal pregnancy, third trimester: Secondary | ICD-10-CM

## 2018-04-02 DIAGNOSIS — O0933 Supervision of pregnancy with insufficient antenatal care, third trimester: Secondary | ICD-10-CM | POA: Diagnosis not present

## 2018-04-02 DIAGNOSIS — Z3689 Encounter for other specified antenatal screening: Secondary | ICD-10-CM | POA: Diagnosis present

## 2018-04-02 DIAGNOSIS — Z3A28 28 weeks gestation of pregnancy: Secondary | ICD-10-CM

## 2018-04-02 DIAGNOSIS — Z363 Encounter for antenatal screening for malformations: Secondary | ICD-10-CM | POA: Insufficient documentation

## 2018-04-02 DIAGNOSIS — Z3480 Encounter for supervision of other normal pregnancy, unspecified trimester: Secondary | ICD-10-CM

## 2018-04-06 ENCOUNTER — Encounter: Payer: Medicaid Other | Admitting: Obstetrics and Gynecology

## 2018-04-24 ENCOUNTER — Ambulatory Visit (INDEPENDENT_AMBULATORY_CARE_PROVIDER_SITE_OTHER): Payer: Medicaid Other | Admitting: Student

## 2018-04-24 ENCOUNTER — Other Ambulatory Visit: Payer: Medicaid Other

## 2018-04-24 VITALS — BP 131/77 | HR 79 | Wt 181.0 lb

## 2018-04-24 DIAGNOSIS — Z3480 Encounter for supervision of other normal pregnancy, unspecified trimester: Secondary | ICD-10-CM

## 2018-04-24 DIAGNOSIS — O0933 Supervision of pregnancy with insufficient antenatal care, third trimester: Secondary | ICD-10-CM

## 2018-04-24 NOTE — Progress Notes (Signed)
Subjective:  Natalie Riggs is a 27 y.o. [phone] at 9982w5d being seen today for ongoing prenatal care.  She is currently monitored for the following issues for this low-risk pregnancy and has Supervision of other normal pregnancy, antepartum and Nausea and vomiting in pregnancy on their problem list.  Patient reports no complaints.  Contractions: Not present. Vag. Bleeding: None.  Movement: Present. Denies leaking of fluid.   The following portions of the patient's history were reviewed and updated as appropriate: allergies, current medications, past family history, past medical history, past social history, past surgical history and problem list. Problem list updated.  Objective:   Vitals:   04/24/18 1019  BP: 131/77  Pulse: 79  Weight: 181 lb (82.1 kg)    Fetal Status: Fetal Heart Rate (bpm): 152 Fundal Height: 30 cm Movement: Present     General:  Alert, oriented and cooperative. Patient is in no acute distress.  Skin: Skin is warm and dry. No rash noted.   Cardiovascular: Normal heart rate noted  Respiratory: Normal respiratory effort, no problems with respiration noted  Abdomen: Soft, gravid, appropriate for gestational age. Pain/Pressure: Absent     Pelvic: Vag. Bleeding: None     Cervical Exam Not Performed        Extremities: Normal range of motion.  Edema: None  Mental Status: Normal mood and affect. Normal behavior. Normal judgment and thought content.   Urinalysis:      Assessment and Plan:  Pregnancy: [id number] at 5382w5d  1. Supervision of other normal pregnancy, antepartum -Patient is doing well overall -Pediatrician is Phineas Realharles Drew -Patient deciding on a BTL versus Depo for contraception -Follow up US MFM  Preterm labor symptoms and general obstetric precautions including but not limited to vaginal bleeding, contractions, leaking of fluid and fetal movement were reviewed in detail with the patient. Please refer to After Visit Summary for other counseling  recommendations.  Return in about 2 weeks (around 05/08/2018) for Routine OB.   Candie MileMitchell, Noella Kipnis R, [place]

## 2018-04-24 NOTE — Patient Instructions (Signed)
Research childbirth classes and hospital preregistration at [email or web address]  Fetal Movement Counts Patient Name: ________________________________________________ Patient Due Date: ____________________ What is a fetal movement count? A fetal movement count is the number of times that you feel your baby move during a certain amount of time. This may also be called a fetal kick count. A fetal movement count is recommended for every pregnant woman. You may be asked to start counting fetal movements as early as week 28 of your pregnancy. Pay attention to when your baby is most active. You may notice your baby's sleep and wake cycles. You may also notice things that make your baby move more. You should do a fetal movement count:  When your baby is normally most active.  At the same time each day.  A good time to count movements is while you are resting, after having something to eat and drink. How do I count fetal movements? 1. Find a quiet, comfortable area. Sit, or lie down on your side. 2. Write down the date, the start time and stop time, and the number of movements that you felt between those two times. Take this information with you to your health care visits. 3. For 2 hours, count kicks, flutters, swishes, rolls, and jabs. You should feel at least 10 movements during 2 hours. 4. You may stop counting after you have felt 10 movements. 5. If you do not feel 10 movements in 2 hours, have something to eat and drink. Then, keep resting and counting for 1 hour. If you feel at least 4 movements during that hour, you may stop counting. Contact a health care provider if:  You feel fewer than 4 movements in 2 hours.  Your baby is not moving like he or she usually does. Date: ____________ Start time: ____________ Stop time: ____________ Movements: ____________ Date: ____________ Start time: ____________ Stop time: ____________ Movements: ____________ Date: ____________ Start time: ____________  Stop time: ____________ Movements: ____________ Date: ____________ Start time: ____________ Stop time: ____________ Movements: ____________ Date: ____________ Start time: ____________ Stop time: ____________ Movements: ____________ Date: ____________ Start time: ____________ Stop time: ____________ Movements: ____________ Date: ____________ Start time: ____________ Stop time: ____________ Movements: ____________ Date: ____________ Start time: ____________ Stop time: ____________ Movements: ____________ Date: ____________ Start time: ____________ Stop time: ____________ Movements: ____________ This information is not intended to replace advice given to you by your health care provider. Make sure you discuss any questions you have with your health care provider. Document Released: 10/19/2006 Document Revised: 05/18/2016 Document Reviewed: 10/29/2015 Elsevier Interactive Patient Education  2018 Else[employer]  Braxton Hicks Contractions Contractions of the uterus can occur throughout pregnancy, but they are not always a sign that you are in labor. You may have practice contractions called Braxton Hicks contractions. These false labor contractions are sometimes confused with true labor. What are Braxton Hicks contractions? Braxton Hicks contractions are tightening movements that occur in the muscles of the uterus before labor. Unlike true labor contractions, these contractions do not result in opening (dilation) and thinning of the cervix. Toward the end of pregnancy (32-34 weeks), Braxton Hicks contractions can happen more often and may become stronger. These contractions are sometimes difficult to tell apart from true labor because they can be very uncomfortable. You should not feel embarrassed if you go to the hospital with false labor. Sometimes, the only way to tell if you are in true labor is for your health care provider to look for changes in the cervix. The health care provider will   do a physical  exam and may monitor your contractions. If you are not in true labor, the exam should show that your cervix is not dilating and your water has not broken. If there are other health problems associated with your pregnancy, it is completely safe for you to be sent home with false labor. You may continue to have Braxton Hicks contractions until you go into true labor. How to tell the difference between true labor and false labor True labor  Contractions last 30-70 seconds.  Contractions become very regular.  Discomfort is usually felt in the top of the uterus, and it spreads to the lower abdomen and low back.  Contractions do not go away with walking.  Contractions usually become more intense and increase in frequency.  The cervix dilates and gets thinner. False labor  Contractions are usually shorter and not as strong as true labor contractions.  Contractions are usually irregular.  Contractions are often felt in the front of the lower abdomen and in the groin.  Contractions may go away when you walk around or change positions while lying down.  Contractions get weaker and are shorter-lasting as time goes on.  The cervix usually does not dilate or become thin. Follow these instructions at home:  Take over-the-counter and prescription medicines only as told by your health care provider.  Keep up with your usual exercises and follow other instructions from your health care provider.  Eat and drink lightly if you think you are going into labor.  If Braxton Hicks contractions are making you uncomfortable: ? Change your position from lying down or resting to walking, or change from walking to resting. ? Sit and rest in a tub of warm water. ? Drink enough fluid to keep your urine pale yellow. Dehydration may cause these contractions. ? Do slow and deep breathing several times an hour.  Keep all follow-up prenatal visits as told by your health care provider. This is  important. Contact a health care provider if:  You have a fever.  You have continuous pain in your abdomen. Get help right away if:  Your contractions become stronger, more regular, and closer together.  You have fluid leaking or gushing from your vagina.  You pass blood-tinged mucus (bloody show).  You have bleeding from your vagina.  You have low back pain that you never had before.  You feel your baby's head pushing down and causing pelvic pressure.  Your baby is not moving inside you as much as it used to. Summary  Contractions that occur before labor are called Braxton Hicks contractions, false labor, or practice contractions.  Braxton Hicks contractions are usually shorter, weaker, farther apart, and less regular than true labor contractions. True labor contractions usually become progressively stronger and regular and they become more frequent.  Manage discomfort from Braxton Hicks contractions by changing position, resting in a warm bath, drinking plenty of water, or practicing deep breathing. This information is not intended to replace advice given to you by your health care provider. Make sure you discuss any questions you have with your health care provider. Document Released: 02/02/2017 Document Revised: 02/02/2017 Document Reviewed: 02/02/2017 Elsevier Interactive Patient Education  2018 Elsevier Inc.    

## 2018-04-25 LAB — CBC
Hematocrit: 29.7 % — ABNORMAL LOW (ref 34.0–46.6)
Hemoglobin: 10.2 g/dL — ABNORMAL LOW (ref 11.1–15.9)
MCH: 30.1 pg (ref 26.6–33.0)
MCHC: 34.3 g/dL (ref 31.5–35.7)
MCV: 88 fL (ref 79–97)
Platelets: 223 10*3/uL (ref 150–450)
RBC: 3.39 x10E6/uL — ABNORMAL LOW (ref 3.77–5.28)
RDW: 13.2 % (ref 12.3–15.4)
WBC: 10.1 10*3/uL (ref 3.4–10.8)

## 2018-04-25 LAB — GLUCOSE TOLERANCE, 2 HOURS W/ 1HR
GLUCOSE, FASTING: 77 mg/dL (ref 65–91)
Glucose, 1 hour: 103 mg/dL (ref 65–179)
Glucose, 2 hour: 83 mg/dL (ref 65–152)

## 2018-04-25 LAB — HIV ANTIBODY (ROUTINE TESTING W REFLEX): HIV SCREEN 4TH GENERATION: NONREACTIVE

## 2018-04-25 LAB — RPR: RPR: NONREACTIVE

## 2018-05-01 ENCOUNTER — Ambulatory Visit (HOSPITAL_COMMUNITY): Payer: Medicaid Other

## 2018-05-02 ENCOUNTER — Encounter: Payer: Self-pay | Admitting: *Deleted

## 2018-05-04 ENCOUNTER — Ambulatory Visit (HOSPITAL_COMMUNITY)
Admission: RE | Admit: 2018-05-04 | Discharge: 2018-05-04 | Disposition: A | Discharge status: Home / Self Care | Payer: Medicaid Other | Source: Ambulatory Visit | Attending: Student | Admitting: Student

## 2018-05-04 DIAGNOSIS — O0933 Supervision of pregnancy with insufficient antenatal care, third trimester: Secondary | ICD-10-CM | POA: Diagnosis present

## 2018-05-04 DIAGNOSIS — Z3A33 33 weeks gestation of pregnancy: Secondary | ICD-10-CM | POA: Diagnosis not present

## 2018-05-10 ENCOUNTER — Ambulatory Visit (INDEPENDENT_AMBULATORY_CARE_PROVIDER_SITE_OTHER): Payer: Medicaid Other | Admitting: Student

## 2018-05-10 DIAGNOSIS — Z3480 Encounter for supervision of other normal pregnancy, unspecified trimester: Secondary | ICD-10-CM

## 2018-05-10 MED ORDER — PROMETHAZINE HCL 25 MG RE SUPP: 25.0000 mg | Freq: Four times a day (QID) | RECTAL | 0 refills | Status: DC | PRN

## 2018-05-10 NOTE — Progress Notes (Signed)
   PRENATAL VISIT NOTE  Subjective:  Natalie Riggs is a 27 y.o. [phone] Natalie Riggs at 7929w0d being seen today for ongoing prenatal care.  She is currently monitored for the following issues for this low-risk2 pregnancy and has Supervision of other normal pregnancy, antepartum and Nausea and vomiting in pregnancy on their problem list.  Patient reports vomiting and nausea for a week. She has been taking the "dissolvable" pill in the morning and evening but not every day because it makes her sleepy. . She has lost 8 pounds in two weeks. Her eye is hurting (right eye) all of a sudden.  She has tried flushing it and still no relief. She has   Contractions: Not present. Vag. Bleeding: None.  Movement: Present. Denies leaking of fluid.   The following portions of the patient's history were reviewed and updated as appropriate: allergies, current medications, past family history, past medical history, past social history, past surgical history and problem list. Problem list updated.  Objective:   Vitals:   05/10/18 0956 05/10/18 1020  BP: (!) 142/73 128/81  Pulse: (!) 119   Temp: 98.3 F (36.8 C)   Weight: 174 lb (78.9 kg)     Fetal Status: Fetal Heart Rate (bpm): 158 Fundal Height: 32 cm Movement: Present     General:  Alert, oriented and cooperative. Patient is in no acute distress.  Skin: Skin is warm and dry. No rash noted.   Cardiovascular: Normal heart rate noted  Respiratory: Normal respiratory effort, no problems with respiration noted  Abdomen: Soft, gravid, appropriate for gestational age.  Pain/Pressure: Absent     Pelvic: Cervical exam deferred        Extremities: Normal range of motion.  Edema: None  Mental Status: Normal mood and affect. Normal behavior. Normal judgment and thought content.   Assessment and Plan:  Pregnancy: [id number] at 6529w0d  1. Supervision of other normal pregnancy, antepartum -Patient feels very weak and dehydrated; wants to go to [facility] for IV fluids. I recommended  that she try home fluids and go to urgent care to get her eye looked at. Patient is undecided.  -BP initially elevated; recheck was normal.  Preterm labor symptoms and general obstetric precautions including but not limited to vaginal bleeding, contractions, leaking of fluid and fetal movement were reviewed in detail with the patient. Please refer to After Visit Summary for other counseling recommendations.  Return in about 2 weeks (around 05/24/2018), or [facility].  No future appointments.  Marylene LandKathryn Lorraine Makayleigh Poliquin, CNM

## 2018-05-30 ENCOUNTER — Encounter: Payer: Medicaid Other | Admitting: Certified Nurse Midwife

## 2018-06-01 ENCOUNTER — Other Ambulatory Visit (HOSPITAL_COMMUNITY)
Admission: RE | Admit: 2018-06-01 | Discharge: 2018-06-01 | Disposition: A | Discharge status: Home / Self Care | Payer: Medicaid Other | Source: Ambulatory Visit | Attending: Certified Nurse Midwife | Admitting: Certified Nurse Midwife

## 2018-06-01 ENCOUNTER — Ambulatory Visit (INDEPENDENT_AMBULATORY_CARE_PROVIDER_SITE_OTHER): Payer: Medicaid Other | Admitting: Obstetrics and Gynecology

## 2018-06-01 ENCOUNTER — Encounter: Payer: Self-pay | Admitting: Obstetrics and Gynecology

## 2018-06-01 VITALS — BP 128/63 | HR 104 | Wt 185.2 lb

## 2018-06-01 DIAGNOSIS — Z23 Encounter for immunization: Secondary | ICD-10-CM | POA: Diagnosis present

## 2018-06-01 DIAGNOSIS — Z3480 Encounter for supervision of other normal pregnancy, unspecified trimester: Secondary | ICD-10-CM

## 2018-06-01 DIAGNOSIS — Z3A36 36 weeks gestation of pregnancy: Secondary | ICD-10-CM | POA: Insufficient documentation

## 2018-06-01 DIAGNOSIS — Z3483 Encounter for supervision of other normal pregnancy, third trimester: Secondary | ICD-10-CM | POA: Insufficient documentation

## 2018-06-01 NOTE — Progress Notes (Signed)
   PRENATAL VISIT NOTE  Subjective:  Natalie Riggs is a 27 y.o. [phone] at 955w1d being seen today for ongoing prenatal care.  She is currently monitored for the following issues for this low-risk pregnancy and has Supervision of other normal pregnancy, antepartum and Nausea and vomiting in pregnancy on their problem list.  Patient reports no complaints.  Contractions: Not present. Vag. Bleeding: None.  Movement: Present. Denies leaking of fluid.   The following portions of the patient's history were reviewed and updated as appropriate: allergies, current medications, past family history, past medical history, past social history, past surgical history and problem list. Problem list updated.  Objective:   Vitals:   06/01/18 1437  BP: 128/63  Pulse: (!) 104  Weight: 185 lb 3.2 oz (84 kg)    Fetal Status: Fetal Heart Rate (bpm): 154 Fundal Height: 36 cm Movement: Present     General:  Alert, oriented and cooperative. Patient is in no acute distress.  Skin: Skin is warm and dry. No rash noted.   Cardiovascular: Normal heart rate noted  Respiratory: Normal respiratory effort, no problems with respiration noted  Abdomen: Soft, gravid, appropriate for gestational age.  Pain/Pressure: Present     Pelvic: Cervical exam deferred        Extremities: Normal range of motion.  Edema: None  Mental Status: Normal mood and affect. Normal behavior. Normal judgment and thought content.   Assessment and Plan:  Pregnancy: [id number] at 4755w1d  1. Supervision of other normal pregnancy, antepartum  - Culture, beta strep (group b only) - GC[facility]Chlamydia probe amp ([place]  Preterm labor symptoms and general obstetric precautions including but not limited to vaginal bleeding, contractions, leaking of fluid and fetal movement were reviewed in detail with the patient. Please refer to After Visit Summary for other counseling recommendations.  No follow-ups on file.  No future  appointments.  Venia CarbonJennifer Rasch, NP

## 2018-06-05 LAB — GC/CHLAMYDIA PROBE AMP (~~LOC~~) NOT AT ARMC
CHLAMYDIA, DNA PROBE: NEGATIVE
NEISSERIA GONORRHEA: NEGATIVE

## 2018-06-05 LAB — CULTURE, BETA STREP (GROUP B ONLY): Strep Gp B Culture: NEGATIVE

## 2018-06-08 ENCOUNTER — Ambulatory Visit (INDEPENDENT_AMBULATORY_CARE_PROVIDER_SITE_OTHER): Payer: Medicaid Other | Admitting: Obstetrics and Gynecology

## 2018-06-08 DIAGNOSIS — Z3483 Encounter for supervision of other normal pregnancy, third trimester: Secondary | ICD-10-CM

## 2018-06-08 DIAGNOSIS — Z3480 Encounter for supervision of other normal pregnancy, unspecified trimester: Secondary | ICD-10-CM

## 2018-06-08 DIAGNOSIS — Z3009 Encounter for other general counseling and advice on contraception: Secondary | ICD-10-CM

## 2018-06-08 NOTE — Progress Notes (Signed)
Pt states has lost some of her mucous plug

## 2018-06-08 NOTE — Progress Notes (Signed)
   PRENATAL VISIT NOTE  Subjective:  Natalie Riggs is a 27 y.o. [phone] at [redacted]w[redacted]d being seen today for ongoing prenatal care.  She is currently monitored for the following issues for this low-risk pregnancy and has Supervision of other normal pregnancy, antepartum; Nausea and vomiting in pregnancy; and Unwanted fertility on their problem list.  Patient reports no complaints.  Contractions: Not present. Vag. Bleeding: None.  Movement: Present. Denies leaking of fluid.   The following portions of the patient's history were reviewed and updated as appropriate: allergies, current medications, past family history, past medical history, past social history, past surgical history and problem list. Problem list updated.  Objective:   Vitals:   06/08/18 1517  BP: 123/68  Pulse: 94  Weight: 186 lb 4.8 oz (84.5 kg)    Fetal Status: Fetal Heart Rate (bpm): 155 Fundal Height: 37 cm Movement: Present     General:  Alert, oriented and cooperative. Patient is in no acute distress.  Skin: Skin is warm and dry. No rash noted.   Cardiovascular: Normal heart rate noted  Respiratory: Normal respiratory effort, no problems with respiration noted  Abdomen: Soft, gravid, appropriate for gestational age.  Pain/Pressure: Present     Pelvic: Cervical exam deferred        Extremities: Normal range of motion.  Edema: None  Mental Status: Normal mood and affect. Normal behavior. Normal judgment and thought content.   Assessment and Plan:  Pregnancy: [id number] at [redacted]w[redacted]d  1. Supervision of other normal pregnancy, antepartum  Doing well, GBS negative   2. Unwanted fertility  BTL consent signed   Term labor symptoms and general obstetric precautions including but not limited to vaginal bleeding, contractions, leaking of fluid and fetal movement were reviewed in detail with the patient. Please refer to After Visit Summary for other counseling recommendations.  No follow-ups on file.  Future Appointments    Date Time Provider Department Center  06/15/2018  3:15 PM Currie Paris, NP [facility]  06/22/2018  4:15 PM Arvilla Market, DO [facility]  06/29/2018  3:35 PM Rolm Bookbinder, CNM [facility]    Venia Carbon, NP

## 2018-06-15 ENCOUNTER — Encounter: Payer: Medicaid Other | Admitting: Nurse Practitioner

## 2018-06-20 ENCOUNTER — Encounter (HOSPITAL_COMMUNITY): Payer: Self-pay | Admitting: Emergency Medicine

## 2018-06-20 ENCOUNTER — Inpatient Hospital Stay (HOSPITAL_COMMUNITY)
Admission: EM | Admit: 2018-06-20 | Discharge: 2018-06-22 | DRG: 807 | Disposition: A | Discharge status: Home / Self Care | Payer: Medicaid Other | Attending: Family Medicine | Admitting: Family Medicine

## 2018-06-20 DIAGNOSIS — Z3009 Encounter for other general counseling and advice on contraception: Secondary | ICD-10-CM

## 2018-06-20 DIAGNOSIS — O139 Gestational [pregnancy-induced] hypertension without significant proteinuria, unspecified trimester: Secondary | ICD-10-CM | POA: Diagnosis not present

## 2018-06-20 DIAGNOSIS — Z23 Encounter for immunization: Secondary | ICD-10-CM | POA: Diagnosis not present

## 2018-06-20 DIAGNOSIS — O134 Gestational [pregnancy-induced] hypertension without significant proteinuria, complicating childbirth: Principal | ICD-10-CM | POA: Diagnosis present

## 2018-06-20 DIAGNOSIS — Z87891 Personal history of nicotine dependence: Secondary | ICD-10-CM

## 2018-06-20 DIAGNOSIS — Z3A38 38 weeks gestation of pregnancy: Secondary | ICD-10-CM | POA: Diagnosis not present

## 2018-06-20 DIAGNOSIS — Z3480 Encounter for supervision of other normal pregnancy, unspecified trimester: Secondary | ICD-10-CM

## 2018-06-20 DIAGNOSIS — Z3483 Encounter for supervision of other normal pregnancy, third trimester: Secondary | ICD-10-CM | POA: Diagnosis present

## 2018-06-20 LAB — CBC
HEMATOCRIT: 35 % — AB (ref 36.0–46.0)
HEMOGLOBIN: 11.7 g/dL — AB (ref 12.0–15.0)
MCH: 30 pg (ref 26.0–34.0)
MCHC: 33.4 g/dL (ref 30.0–36.0)
MCV: 89.7 fL (ref 78.0–100.0)
Platelets: 201 10*3/uL (ref 150–400)
RBC: 3.9 MIL/uL (ref 3.87–5.11)
RDW: 12.9 % (ref 11.5–15.5)
WBC: 12.4 10*3/uL — AB (ref 4.0–10.5)

## 2018-06-20 LAB — TYPE AND SCREEN
ABO/RH(D): B POS
ANTIBODY SCREEN: NEGATIVE

## 2018-06-20 LAB — ABO/RH: ABO/RH(D): B POS

## 2018-06-20 MED ORDER — COCONUT OIL OIL: 1.0000 "application " | TOPICAL_OIL | Status: DC | PRN

## 2018-06-20 MED ORDER — DIPHENHYDRAMINE HCL 25 MG PO CAPS: 25.0000 mg | ORAL_CAPSULE | Freq: Four times a day (QID) | ORAL | Status: DC | PRN

## 2018-06-20 MED ORDER — DIBUCAINE 1 % RE OINT: 1.0000 "application " | TOPICAL_OINTMENT | RECTAL | Status: DC | PRN

## 2018-06-20 MED ORDER — ONDANSETRON HCL 4 MG PO TABS: 4.0000 mg | ORAL_TABLET | ORAL | Status: DC | PRN

## 2018-06-20 MED ORDER — MISOPROSTOL 200 MCG PO TABS
1000.0000 ug | ORAL_TABLET | Freq: Once | ORAL | Status: AC
  Administered 2018-06-20: 1000 ug via ORAL

## 2018-06-20 MED ORDER — OXYCODONE-ACETAMINOPHEN 5-325 MG PO TABS: 2.0000 | ORAL_TABLET | ORAL | Status: DC | PRN

## 2018-06-20 MED ORDER — ACETAMINOPHEN 325 MG PO TABS
650.0000 mg | ORAL_TABLET | ORAL | Status: DC | PRN
  Administered 2018-06-20 – 2018-06-21 (×2): 650 mg via ORAL
  Filled 2018-06-20 (×2): qty 2

## 2018-06-20 MED ORDER — PRENATAL MULTIVITAMIN CH
1.0000 | ORAL_TABLET | Freq: Every day | ORAL | Status: DC
  Administered 2018-06-21: 1 via ORAL
  Filled 2018-06-20: qty 1

## 2018-06-20 MED ORDER — INFLUENZA VAC SPLIT QUAD 0.5 ML IM SUSY
0.5000 mL | PREFILLED_SYRINGE | INTRAMUSCULAR | Status: AC
  Administered 2018-06-22: 0.5 mL via INTRAMUSCULAR
  Filled 2018-06-20: qty 0.5

## 2018-06-20 MED ORDER — MEDROXYPROGESTERONE ACETATE 150 MG/ML IM SUSP
150.0000 mg | Freq: Once | INTRAMUSCULAR | Status: AC
  Administered 2018-06-22: 150 mg via INTRAMUSCULAR
  Filled 2018-06-20: qty 1

## 2018-06-20 MED ORDER — WITCH HAZEL-GLYCERIN EX PADS: 1.0000 "application " | MEDICATED_PAD | CUTANEOUS | Status: DC | PRN

## 2018-06-20 MED ORDER — SIMETHICONE 80 MG PO CHEW: 80.0000 mg | CHEWABLE_TABLET | ORAL | Status: DC | PRN

## 2018-06-20 MED ORDER — IBUPROFEN 600 MG PO TABS
600.0000 mg | ORAL_TABLET | Freq: Four times a day (QID) | ORAL | Status: DC
  Administered 2018-06-20 – 2018-06-22 (×8): 600 mg via ORAL
  Filled 2018-06-20 (×7): qty 1

## 2018-06-20 MED ORDER — OXYCODONE-ACETAMINOPHEN 5-325 MG PO TABS: 1.0000 | ORAL_TABLET | ORAL | Status: DC | PRN

## 2018-06-20 MED ORDER — BENZOCAINE-MENTHOL 20-0.5 % EX AERO: 1.0000 "application " | INHALATION_SPRAY | CUTANEOUS | Status: DC | PRN

## 2018-06-20 MED ORDER — DOCUSATE SODIUM 100 MG PO CAPS
100.0000 mg | ORAL_CAPSULE | Freq: Two times a day (BID) | ORAL | Status: DC
  Administered 2018-06-21 (×2): 100 mg via ORAL
  Filled 2018-06-20 (×3): qty 1

## 2018-06-20 MED ORDER — ONDANSETRON HCL 4 MG/2ML IJ SOLN: 4.0000 mg | INTRAMUSCULAR | Status: DC | PRN

## 2018-06-20 MED ORDER — OXYTOCIN 10 UNIT/ML IJ SOLN
10.0000 [IU] | Freq: Once | INTRAMUSCULAR | Status: AC
  Administered 2018-06-20: 10 [IU] via INTRAMUSCULAR

## 2018-06-20 MED ORDER — IBUPROFEN 400 MG PO TABS
600.0000 mg | ORAL_TABLET | Freq: Once | ORAL | Status: AC
  Administered 2018-06-20: 600 mg via ORAL
  Filled 2018-06-20: qty 1

## 2018-06-20 NOTE — ED Provider Notes (Signed)
[facility] EMERGENCY DEPARTMENT Provider Note   CSN: [id number] Arrival date & time: 06/20/18  6578     History   Chief Complaint Chief Complaint  Patient presents with  . preg/Labor    HPI Natalie Riggs is a 27 y.o. female.  Patient gravida 4 para 3 due date September 24.  Patient felt that she started in labor after midnight.  Water is not broke yet.  She is having a strong feeling to push.  [facility]  By the [facility] service.  No difficulty with the pregnancy course.  Her other children were all vaginal deliveries.  No known allergies.     Past Medical History:  Diagnosis Date  . Miscarriage     Patient Active Problem List   Diagnosis Date Noted  . Unwanted fertility 06/08/2018  . Nausea and vomiting in pregnancy 11/02/2017  . Supervision of other normal pregnancy, antepartum 01/24/2017    Past Surgical History:  Procedure Laterality Date  . DILATATION & CURETTAGE/HYSTEROSCOPY WITH MYOSURE       OB History    Gravida  5   Para  3   Term  3   Preterm      AB  1   Living  3     SAB  1   TAB      Ectopic      Multiple      Live Births  3            Home Medications    Prior to Admission medications   Medication Sig Start Date End Date Taking? Authorizing Provider  acetaminophen (TYLENOL) 500 MG tablet Take 500-1,000 mg by mouth every 6 (six) hours as needed for mild pain, fever or headache.     [provider]  ondansetron (ZOFRAN ODT) 8 MG disintegrating tablet Take 1 tablet (8 mg total) by mouth every 8 (eight) hours as needed for nausea or vomiting. 01/30/18   Katrinka Blazing, [place], CNM  Prenatal Vit-Fe Fumarate-FA (PRENATAL MULTIVITAMIN) TABS tablet Take 1 tablet by mouth daily at 12 noon.    [provider]  promethazine (PHENERGAN) 25 MG suppository Place 1 suppository (25 mg total) rectally every 6 (six) hours as needed for nausea or vomiting. Patient not taking: Reported on  06/08/2018 05/10/18   Marylene Land, CNM  promethazine (PHENERGAN) 25 MG tablet Take 0.5-1 tablets (12.5-25 mg total) by mouth every 6 (six) hours as needed for nausea or vomiting. Patient not taking: Reported on 04/24/2018 01/30/18   Dorathy Kinsman, CNM    Family History Family History  Problem Relation Age of Onset  . Asthma Brother     Social History Social History   Tobacco Use  . Smoking status: Former Smoker    Packs/day: 0.00  . Smokeless tobacco: Never Used  Substance Use Topics  . Alcohol use: No  . Drug use: No     Allergies   Patient has no known allergies.   Review of Systems Review of Systems  Constitutional: Negative for fever.  HENT: Negative for congestion.   Respiratory: Negative for shortness of breath.   Cardiovascular: Negative for chest pain and leg swelling.  Gastrointestinal: Positive for abdominal pain. Negative for nausea and vomiting.  Genitourinary: Negative for vaginal bleeding and vaginal discharge.  Musculoskeletal: Positive for back pain.  Skin: Negative for rash.  Neurological: Negative for headaches.  Hematological: Does not bruise/bleed easily.  Psychiatric/Behavioral: Negative for confusion.     Physical  Exam Updated Vital Signs BP (!) 128/96   Pulse (!) 115   Temp 98.8 F (37.1 C)   Resp 15   LMP 09/21/2017   SpO2 98%   Physical Exam  Constitutional: She is oriented to person, place, and time. She appears well-developed and well-nourished.  HENT:  Head: Normocephalic and atraumatic.  Mouth/Throat: Oropharynx is clear and moist.  Eyes: Conjunctivae and EOM are normal.  Cardiovascular: Normal rate and regular rhythm.  Pulmonary/Chest: Effort normal and breath sounds normal.  Abdominal: She exhibits distension.  Abdomen gravid.  Seems to be term.  Genitourinary:  Genitourinary Comments: Amniotic sac intact.  Dilated about 9 cm.  No bloody discharge.  Baby's had palpable.  Musculoskeletal: She exhibits no edema.   Neurological: She is alert and oriented to person, place, and time. No cranial nerve deficit or sensory deficit. She exhibits normal muscle tone. Coordination normal.  Skin: Skin is warm.  Nursing note and vitals reviewed.    ED Treatments / Results  Labs (all labs ordered are listed, but only abnormal results are displayed) Labs Reviewed  CBC - Abnormal; Notable for the following components:      Result Value   WBC 12.4 (*)    Hemoglobin 11.7 (*)    HCT 35.0 (*)    All other components within normal limits  TYPE AND SCREEN    EKG None  Radiology No results found.  Procedures Procedures (including critical care time)  CRITICAL CARE Performed by: Vanetta MuldersScott Kimbely Whiteaker Total critical care time: 30 minutes Critical care time was exclusive of separately billable procedures and treating other patients. Critical care was necessary to treat or prevent imminent or life-threatening deterioration. Critical care was time spent personally by me on the following activities: development of treatment plan with patient and/or surrogate as well as nursing, discussions with consultants, evaluation of patient's response to treatment, examination of patient, obtaining history from patient or surrogate, ordering and performing treatments and interventions, ordering and review of laboratory studies, ordering and review of radiographic studies, pulse oximetry and re-evaluation of patient's condition.   Medications Ordered in ED Medications - No data to display   Initial Impression / Assessment and Plan / ED Course  I have reviewed the triage vital signs and the nursing notes.  Pertinent labs & imaging results that were available during my care of the patient were reviewed by me and considered in my medical decision making (see chart for details).    Patient term labor.  Patient gravida 4 para 3.  Has 3 healthy children at home the most recent one was born a year ago.  All her deliveries have been  vaginal.  [facility]  It sounds as if she was followed by the teaching service there.  Been no problems during her pregnancy.  Upon arrival patient's daughter had not broken.  She been experiencing contractions since shortly after midnight.  Patient was feeling a strong desire to push.  She was about 9 cm dilated.  Sac intact.  Baby's heart beat was around 140's no significant decelerations.  Patient's blood pressures are reasonable systolic [id number].  Diastolics in the low 90s.  No real concerns for preeclampsia.  Rapid response OB nurse arrived confirmed situation.  Contacted women[facility] we felt that she was too close to time for delivery to [facility] to [facility].  In addition it appeared that the baby was doing well and had a good prenatal course and this was mother's fourth delivery.  Sent over a fellow from [facility] to assist with the delivery.  Patient had a vaginal delivery without any significant complications good delivery of the placenta.  Baby was fine.  Good cry pinked up appropriately.  Following delivery of the placenta patient received Pitocin.  [facility]  [facility] will do the transfer.   Final Clinical Impressions(s) / ED Diagnoses   Final diagnoses:  Normal delivery at term    ED Discharge Orders    None       Vanetta Mulders, MD 06/20/18 [phone]

## 2018-06-20 NOTE — ED Notes (Signed)
[facility] Rapid Response called @ 0729-per Asher MuirJamie, RN/Charge-called by Marylene LandAngela

## 2018-06-20 NOTE — ED Notes (Signed)
[facility]

## 2018-06-20 NOTE — ED Triage Notes (Signed)
Pt walked in this morning with c/o labor , pt is due on 9/26 , water has not broke and no bleeding noted

## 2018-06-20 NOTE — ED Notes (Signed)
[id number] OB MD in room  0803 MD broke water  [id number] clear fluid returned  0811 Baby girl delivered 0813 Cord cut  0816 placenta delivered

## 2018-06-20 NOTE — ED Notes (Signed)
OB RN at bedside

## 2018-06-20 NOTE — H&P (Signed)
OBSTETRIC ADMISSION HISTORY AND PHYSICAL  Natalie Riggs is a 27 y.o. female [phone] with IUP at 38w6 presented to [facility] ER for labor and found to have advanced dilation.   Prenatal History/Complications:  Past Medical History: Past Medical History:  Diagnosis Date  . Miscarriage     Past Surgical History: Past Surgical History:  Procedure Laterality Date  . DILATATION & CURETTAGE/HYSTEROSCOPY WITH MYOSURE      Obstetrical History: OB History    Gravida  5   Para  4   Term  4   Preterm      AB  1   Living  4     SAB  1   TAB      Ectopic      Multiple  0   Live Births  4           Social History: Social History   Socioeconomic History  . Marital status: Single    Spouse name: Not on file  . Number of children: Not on file  . Years of education: Not on file  . Highest education level: Not on file  Occupational History  . Not on file  Social Needs  . Financial resource strain: Not on file  . Food insecurity:    Worry: Not on file    Inability: Not on file  . Transportation needs:    Medical: Not on file    Non-medical: Not on file  Tobacco Use  . Smoking status: Former Smoker    Packs/day: 0.00  . Smokeless tobacco: Never Used  Substance and Sexual Activity  . Alcohol use: No  . Drug use: No  . Sexual activity: Yes    Birth control/protection: None  Lifestyle  . Physical activity:    Days per week: Not on file    Minutes per session: Not on file  . Stress: Not on file  Relationships  . Social connections:    Talks on phone: Not on file    Gets together: Not on file    Attends religious service: Not on file    Active member of club or organization: Not on file    Attends meetings of clubs or organizations: Not on file    Relationship status: Not on file  Other Topics Concern  . Not on file  Social History Narrative  . Not on file    Family History: Family History  Problem Relation Age of Onset  . Asthma Brother      Allergies: No Known Allergies  Medications Prior to Admission  Medication Sig Dispense Refill Last Dose  . ondansetron (ZOFRAN ODT) 8 MG disintegrating tablet Take 1 tablet (8 mg total) by mouth every 8 (eight) hours as needed for nausea or vomiting. 20 tablet 3 Past Week at Unknown time  . Prenatal Vit-Fe Fumarate-FA (PRENATAL MULTIVITAMIN) TABS tablet Take 1 tablet by mouth daily at 12 noon.   Past Month at Unknown time  . acetaminophen (TYLENOL) 500 MG tablet Take 500-1,000 mg by mouth every 6 (six) hours as needed for mild pain, fever or headache.    Taking  . promethazine (PHENERGAN) 25 MG suppository Place 1 suppository (25 mg total) rectally every 6 (six) hours as needed for nausea or vomiting. (Patient not taking: Reported on 06/08/2018) 12 each 0 Not Taking  . promethazine (PHENERGAN) 25 MG tablet Take 0.5-1 tablets (12.5-25 mg total) by mouth every 6 (six) hours as needed for nausea or vomiting. (Patient not taking: Reported on 04/24/2018)  30 tablet 3 Not Taking     Review of Systems   All systems reviewed and negative except as stated in HPI  Blood pressure 134/82, pulse (!) 115, temperature 98.8 F (37.1 C), resp. rate 15, last menstrual period 09/21/2017, SpO2 98 %, unknown if currently breastfeeding. General appearance: alert, cooperative and appears stated age Lungs: clear to auscultation bilaterally Heart: regular rate and rhythm Abdomen: soft, non-tender; bowel sounds normal Pelvic: Adequate, 9.5cm/100/+1 Extremities: Homans sign is negative, no sign of DVT DTR's wnl Presentation: cephalic Fetal monitoringBaseline: 140 bpm, Variability: Good {> 6 bpm), Accelerations: Reactive and Decelerations: Absent Uterine activityFrequency: Every 3 minutes Dilation: 10 Effacement (%): 100 Station: Plus 2 Exam by:: Dr. Alvester Morin   Prenatal labs: ABO, Rh: --/--/B POS, B POS Performed at [facility], [address], [place], [place] [address]  [phone]) Antibody: NEG  (09/18 0733) Rubella:   RPR: Non Reactive (07/23 0950)  HBsAg:    HIV: Non Reactive (07/23 0950)  GBS:     Prenatal Transfer Tool  Maternal Diabetes: No Genetic Screening: Normal Maternal Ultrasounds/Referrals: Normal Fetal Ultrasounds or other Referrals:  None Maternal Substance Abuse:  No Significant Maternal Medications:  None Significant Maternal Lab Results: Lab values include: Group B Strep negative  Results for orders placed or performed during the hospital encounter of 06/20/18 (from the past 24 hour(s))  Type and screen [facility]   Collection Time: 06/20/18  7:33 AM  Result Value Ref Range   ABO/RH(D) B POS    Antibody Screen NEG    Sample Expiration      06/23/2018 Performed at [facility], [address], [place], [place] [address]   ABO/Rh   Collection Time: 06/20/18  7:33 AM  Result Value Ref Range   ABO/RH(D)      B POS Performed at [facility], [address], [place], [place] [address]   CBC   Collection Time: 06/20/18  7:43 AM  Result Value Ref Range   WBC 12.4 (H) 4.0 - 10.5 K/uL   RBC 3.90 3.87 - 5.11 MIL/uL   Hemoglobin 11.7 (L) 12.0 - 15.0 g/dL   HCT 62.1 (L) 30.8 - 65.7 %   MCV 89.7 78.0 - 100.0 fL   MCH 30.0 26.0 - 34.0 pg   MCHC 33.4 30.0 - 36.0 g/dL   RDW 84.6 96.2 - 95.2 %   Platelets 201 150 - 400 K/uL    Patient Active Problem List   Diagnosis Date Noted  . Unwanted fertility 06/08/2018  . Nausea and vomiting in pregnancy 11/02/2017  . Supervision of other normal pregnancy, antepartum 01/24/2017    Assessment/Plan:  Natalie Riggs is a 27 y.o. [id number] at [redacted]w[redacted]d here for SOL  #Labor:  Emergent delivery #Pain:  Fentanyl after delivery if desired #FWB: Cat 1 #ID:  GBS neg #MOF: formula #MOC:  Depo vs BTL identified prenatally. BTS consent signed 04/24/18 #Circ:  NA/girl  Federico Flake, MD  06/20/2018, 12:03 PM

## 2018-06-20 NOTE — ED Notes (Signed)
[facility] called @ 0819-per [place], RN-called by [facility]

## 2018-06-20 NOTE — ED Notes (Signed)
Family at bedside. 

## 2018-06-21 ENCOUNTER — Encounter (HOSPITAL_COMMUNITY): Payer: Self-pay | Admitting: Emergency Medicine

## 2018-06-21 DIAGNOSIS — O139 Gestational [pregnancy-induced] hypertension without significant proteinuria, unspecified trimester: Secondary | ICD-10-CM | POA: Diagnosis not present

## 2018-06-21 LAB — COMPREHENSIVE METABOLIC PANEL
ALK PHOS: 139 U/L — AB (ref 38–126)
ALT: 15 U/L (ref 0–44)
ANION GAP: 10 (ref 5–15)
AST: 23 U/L (ref 15–41)
Albumin: 3 g/dL — ABNORMAL LOW (ref 3.5–5.0)
BUN: 8 mg/dL (ref 6–20)
CALCIUM: 8.7 mg/dL — AB (ref 8.9–10.3)
CO2: 18 mmol/L — AB (ref 22–32)
Chloride: 104 mmol/L (ref 98–111)
Creatinine, Ser: 0.63 mg/dL (ref 0.44–1.00)
Glucose, Bld: 124 mg/dL — ABNORMAL HIGH (ref 70–99)
Potassium: 3.4 mmol/L — ABNORMAL LOW (ref 3.5–5.1)
SODIUM: 132 mmol/L — AB (ref 135–145)
TOTAL PROTEIN: 6.4 g/dL — AB (ref 6.5–8.1)
Total Bilirubin: 0.6 mg/dL (ref 0.3–1.2)

## 2018-06-21 LAB — RPR: RPR Ser Ql: NONREACTIVE

## 2018-06-21 MED ORDER — NIFEDIPINE ER OSMOTIC RELEASE 30 MG PO TB24
30.0000 mg | ORAL_TABLET | Freq: Every day | ORAL | Status: DC
  Filled 2018-06-21: qty 1

## 2018-06-21 NOTE — Progress Notes (Signed)
During patient assessment, RN found IV in left hand. IV is not charted in flowsheet. Dressing changed due to dressing loosening during shower. Site dry and intact, IV saline locked.

## 2018-06-21 NOTE — Progress Notes (Addendum)
Upon assessment of patient, BP found to be 94/80. I called Cam HaiKimberly Shaw, CNM to report the BP and inquire about whether the ordered Procardia should be given. Orders given to recheck BP and call back with results.  Called CNM back at 0100 with repeat BP of 111/77 and received orders to hold this dose of Procardia.

## 2018-06-21 NOTE — Progress Notes (Signed)
Post Partum Day #1 Subjective: no complaints, up ad lib and tolerating PO; pt noted to have BPs >130/80 x 2 and was started on Procardia 30 XL last evening; denies s/s pre-e; P/C ratio still pending, but bloodwork neg for pre-e; bottlefeeding and plans DMPA prior to d/c for contraception  Objective: Blood pressure 124/73, pulse 81, temperature 97.8 F (36.6 C), temperature source Oral, resp. rate 17, last menstrual period 09/21/2017, SpO2 98 %, unknown if currently breastfeeding.  Physical Exam:  General: alert, cooperative and no distress Lochia: appropriate Uterine Fundus: firm DVT Evaluation: No evidence of DVT seen on physical exam.  Recent Labs    06/20/18 0743  HGB 11.7*  HCT 35.0*    Assessment/Plan: Plan for discharge tomorrow  Plan to enroll in [employer] HTN Postpartum at d/c   LOS: 1 day   Cam HaiSHAW, Dava Rensch CNM 06/21/2018, 9:50 AM

## 2018-06-22 ENCOUNTER — Encounter: Payer: Medicaid Other | Admitting: Internal Medicine

## 2018-06-22 MED ORDER — IBUPROFEN 600 MG PO TABS: 600.0000 mg | ORAL_TABLET | Freq: Four times a day (QID) | ORAL | 0 refills | Status: DC

## 2018-06-22 MED ORDER — ACETAMINOPHEN 325 MG PO TABS: 650.0000 mg | ORAL_TABLET | ORAL | 0 refills | Status: DC | PRN

## 2018-06-22 NOTE — Discharge Summary (Addendum)
OB Discharge Summary     Patient Name: Natalie Riggs DOB: 27-Nov-1990 MRN: [id number]  Date of admission: 06/20/2018 Delivering MD:     Date of discharge: 06/22/2018  Admitting diagnosis: Normal delivery at term [O80] Intrauterine pregnancy: 1030w6d     Secondary diagnosis:  Active Problems:   Supervision of other normal pregnancy, antepartum   Unwanted fertility   Normal vaginal delivery   Gestational hypertension  Additional problems: none     Discharge diagnosis: Term Pregnancy Delivered                                                                                                Post partum procedures:none  Augmentation: none  Complications: None  Hospital course:  Onset of Labor With Vaginal Delivery     27 y.o. yo [id number] at 4630w6d was admitted in Active Labor on 06/20/2018. Patient had an uncomplicated labor course as follows:  Membrane Rupture Time/Date: 8:03 AM ,06/20/2018   Intrapartum Procedures: Episiotomy:                                           Lacerations:     Patient had a delivery of a Viable infant. 06/20/2018  Information for the patient's newborn:  Natalie Riggs, Girl Natalie Riggs [[id number]][[id number]]  Delivery Method: Vag-Spont    Pateint had an uncomplicated postpartum course.  She is ambulating, tolerating a regular diet, passing flatus, and urinating well. Patient is discharged home in stable condition on 06/22/18.   Physical exam  Vitals:   06/21/18 0522 06/21/18 1033 06/21/18 1455 06/21/18 2310  BP: 124/73 117/75 127/80 123/85  Pulse: 81 70 69 89  Resp: 17   16  Temp: 97.8 F (36.6 C)   98.3 F (36.8 C)  TempSrc: Oral     SpO2: 98%  100% 99%   General: alert, cooperative and no distress Lochia: appropriate Uterine Fundus: firm Incision: N/A DVT Evaluation: No evidence of DVT seen on physical exam. Labs: Lab Results  Component Value Date   WBC 12.4 (H) 06/20/2018   HGB 11.7 (L) 06/20/2018   HCT 35.0 (L) 06/20/2018   MCV 89.7 06/20/2018   PLT 201  06/20/2018   CMP Latest Ref Rng & Units 06/20/2018  Glucose 70 - 99 mg/dL 284(X124(H)  BUN 6 - 20 mg/dL 8  Creatinine 3.240.44 - 4.011.00 mg/dL 0.270.63  Sodium 253135 - 664145 mmol/L 132(L)  Potassium 3.5 - 5.1 mmol/L 3.4(L)  Chloride 98 - 111 mmol/L 104  CO2 22 - 32 mmol/L 18(L)  Calcium 8.9 - 10.3 mg/dL 4.0(H8.7(L)  Total Protein 6.5 - 8.1 g/dL 6.4(L)  Total Bilirubin 0.3 - 1.2 mg/dL 0.6  Alkaline Phos 38 - 126 U/L 139(H)  AST 15 - 41 U/L 23  ALT 0 - 44 U/L 15    Discharge instruction: per After Visit Summary and "Baby and Me Booklet".  After visit meds:  Allergies as of 06/22/2018   No Known Allergies     Medication List    STOP  taking these medications   promethazine 25 MG suppository Commonly known as:  PHENERGAN   promethazine 25 MG tablet Commonly known as:  PHENERGAN     TAKE these medications   acetaminophen 325 MG tablet Commonly known as:  TYLENOL Take 2 tablets (650 mg total) by mouth every 4 (four) hours as needed (for pain scale < 4).   ibuprofen 600 MG tablet Commonly known as:  ADVIL,MOTRIN Take 1 tablet (600 mg total) by mouth every 6 (six) hours.   ondansetron 8 MG disintegrating tablet Commonly known as:  ZOFRAN-ODT Take 1 tablet (8 mg total) by mouth every 8 (eight) hours as needed for nausea or vomiting.   prenatal multivitamin Tabs tablet Take 1 tablet by mouth daily at 12 noon.       Diet: routine diet  Activity: Advance as tolerated. Pelvic rest for 6 weeks.   Outpatient follow up:6 weeks Follow up Appt:No future appointments. Follow up Visit:No follow-ups on file.  Postpartum contraception: Combination OCPs  Newborn Data: Live born female  Birth Weight: 6 lb 13.5 oz (3105 g) APGAR: 8, 9  Newborn Delivery   Birth date/time:  06/20/2018 08:11:00 Delivery type:  Vaginal, Spontaneous     Baby Feeding: Bottle and Breast Disposition:home with mother   06/22/2018 Denzil Hughes, MD  OB FELLOW DISCHARGE ATTESTATION  I have seen and examined this  patient. I agree with above documentation and have made edits as needed.   Ted Mcalpine, MD OB Fellow 12:25 PM

## 2018-06-22 NOTE — Discharge Instructions (Signed)
Vaginal Delivery, Care After °Refer to this sheet in the next few weeks. These instructions provide you with information about caring for yourself after vaginal delivery. Your health care provider may also give you more specific instructions. Your treatment has been planned according to current medical practices, but problems sometimes occur. Call your health care provider if you have any problems or questions. °What can I expect after the procedure? °After vaginal delivery, it is common to have: °· Some bleeding from your vagina. °· Soreness in your abdomen, your vagina, and the area of skin between your vaginal opening and your anus (perineum). °· Pelvic cramps. °· Fatigue. ° °Follow these instructions at home: °Medicines °· Take over-the-counter and prescription medicines only as told by your health care provider. °· If you were prescribed an antibiotic medicine, take it as told by your health care provider. Do not stop taking the antibiotic until it is finished. °Driving ° °· Do not drive or operate heavy machinery while taking prescription pain medicine. °· Do not drive for 24 hours if you received a sedative. °Lifestyle °· Do not drink alcohol. This is especially important if you are breastfeeding or taking medicine to relieve pain. °· Do not use tobacco products, including cigarettes, chewing tobacco, or e-cigarettes. If you need help quitting, ask your health care provider. °Eating and drinking °· Drink at least 8 eight-ounce glasses of water every day unless you are told not to by your health care provider. If you choose to breastfeed your baby, you may need to drink more water than this. °· Eat high-fiber foods every day. These foods may help prevent or relieve constipation. High-fiber foods include: °? Whole grain cereals and breads. °? Brown rice. °? Beans. °? Fresh fruits and vegetables. °Activity °· Return to your normal activities as told by your health care provider. Ask your health care provider  what activities are safe for you. °· Rest as much as possible. Try to rest or take a nap when your baby is sleeping. °· Do not lift anything that is heavier than your baby or 10 lb (4.5 kg) until your health care provider says that it is safe. °· Talk with your health care provider about when you can engage in sexual activity. This may depend on your: °? Risk of infection. °? Rate of healing. °? Comfort and desire to engage in sexual activity. °Vaginal Care °· If you have an episiotomy or a vaginal tear, check the area every day for signs of infection. Check for: °? More redness, swelling, or pain. °? More fluid or blood. °? Warmth. °? Pus or a bad smell. °· Do not use tampons or douches until your health care provider says this is safe. °· Watch for any blood clots that may pass from your vagina. These may look like clumps of dark red, brown, or black discharge. °General instructions °· Keep your perineum clean and dry as told by your health care provider. °· Wear loose, comfortable clothing. °· Wipe from front to back when you use the toilet. °· Ask your health care provider if you can shower or take a bath. If you had an episiotomy or a perineal tear during labor and delivery, your health care provider may tell you not to take baths for a certain length of time. °· Wear a bra that supports your breasts and fits you well. °· If possible, have someone help you with household activities and help care for your baby for at least a few days after   you leave the hospital. °· Keep all follow-up visits for you and your baby as told by your health care provider. This is important. °Contact a health care provider if: °· You have: °? Vaginal discharge that has a bad smell. °? Difficulty urinating. °? Pain when urinating. °? A sudden increase or decrease in the frequency of your bowel movements. °? More redness, swelling, or pain around your episiotomy or vaginal tear. °? More fluid or blood coming from your episiotomy or  vaginal tear. °? Pus or a bad smell coming from your episiotomy or vaginal tear. °? A fever. °? A rash. °? Little or no interest in activities you used to enjoy. °? Questions about caring for yourself or your baby. °· Your episiotomy or vaginal tear feels warm to the touch. °· Your episiotomy or vaginal tear is separating or does not appear to be healing. °· Your breasts are painful, hard, or turn red. °· You feel unusually sad or worried. °· You feel nauseous or you vomit. °· You pass large blood clots from your vagina. If you pass a blood clot from your vagina, save it to show to your health care provider. Do not flush blood clots down the toilet without having your health care provider look at them. °· You urinate more than usual. °· You are dizzy or light-headed. °· You have not breastfed at all and you have not had a menstrual period for 12 weeks after delivery. °· You have stopped breastfeeding and you have not had a menstrual period for 12 weeks after you stopped breastfeeding. °Get help right away if: °· You have: °? Pain that does not go away or does not get better with medicine. °? Chest pain. °? Difficulty breathing. °? Blurred vision or spots in your vision. °? Thoughts about hurting yourself or your baby. °· You develop pain in your abdomen or in one of your legs. °· You develop a severe headache. °· You faint. °· You bleed from your vagina so much that you fill two sanitary pads in one hour. °This information is not intended to replace advice given to you by your health care provider. Make sure you discuss any questions you have with your health care provider. °Document Released: 09/16/2000 Document Revised: 03/02/2016 Document Reviewed: 10/04/2015 °Elsevier Interactive Patient Education © 2018 [employer] ° °

## 2018-06-26 ENCOUNTER — Encounter (HOSPITAL_COMMUNITY): Payer: Self-pay | Admitting: *Deleted

## 2018-09-10 ENCOUNTER — Ambulatory Visit: Payer: Medicaid Other

## 2018-09-11 ENCOUNTER — Encounter: Payer: Self-pay | Admitting: *Deleted

## 2018-09-11 ENCOUNTER — Ambulatory Visit: Payer: Medicaid Other

## 2018-09-11 NOTE — Progress Notes (Signed)
Natalie Riggs came for depo-provera appointment. Per chart review she has not had her postpartum appointment. Informed registrar she needs to be rescheduled for a postpartum appointment and can offer her appointment today if possible. Registrar offered her appointment to after hours office today

## 2018-09-17 ENCOUNTER — Ambulatory Visit (INDEPENDENT_AMBULATORY_CARE_PROVIDER_SITE_OTHER): Payer: Medicaid Other | Admitting: Family Medicine

## 2018-09-17 ENCOUNTER — Encounter: Payer: Self-pay | Admitting: Family Medicine

## 2018-09-17 VITALS — BP 114/78 | HR 85 | Wt 187.0 lb

## 2018-09-17 DIAGNOSIS — Z1389 Encounter for screening for other disorder: Secondary | ICD-10-CM | POA: Diagnosis not present

## 2018-09-17 DIAGNOSIS — Z793 Long term (current) use of hormonal contraceptives: Secondary | ICD-10-CM

## 2018-09-17 DIAGNOSIS — Z3042 Encounter for surveillance of injectable contraceptive: Secondary | ICD-10-CM

## 2018-09-17 MED ORDER — MEDROXYPROGESTERONE ACETATE 150 MG/ML IM SUSP
150.0000 mg | Freq: Once | INTRAMUSCULAR | Status: AC
  Administered 2018-09-17: 150 mg via INTRAMUSCULAR

## 2018-09-17 NOTE — Progress Notes (Signed)
Subjective:     Natalie Riggs is a 27 y.o. female who presents for a postpartum visit. She is 12ws 5 days  postpartum following a spontaneous vaginal delivery. I have fully reviewed the prenatal and intrapartum course. The delivery was at 7223w6d gestation. Outcome: spontaneous vaginal delivery. Anesthesia: none. Postpartum course has been uncomplicated. Baby's course has been uncomplicated. Baby is feeding by bottle -Gerber good Start. Having some spotting today. Has had periods since October  Bowel function is normal. Bladder function is normal. Patient is sexually active. Contraception method is Depo-Provera injections. Postpartum depression screening: negative.  Review of Systems A comprehensive review of systems was negative.   Objective:    BP 114/78   Pulse 85   Wt 187 lb (84.8 kg)   LMP 07/23/2018   Breastfeeding No   BMI 29.29 kg/m   General:  alert, cooperative, appears stated age and no distress  Lungs: clear to auscultation bilaterally and nonlabored breathing  Heart:  regular rate and rhythm, S1, S2 normal, no murmur, click, rub or gallop  Abdomen: soft, non-tender; bowel sounds normal; no masses,  no organomegaly  Neuro/Psych: Alert and oriented x 4. No gross neurological deficits.        Assessment:     Normal postpartum exam. Pap smear not done at today's visit.   Plan:    1. Contraception: Depo-Provera injections 2. Declines pap today  3. Follow up in: 2 weeks for pap or as needed.

## 2018-10-11 ENCOUNTER — Ambulatory Visit: Payer: Medicaid Other | Admitting: Family Medicine

## 2018-12-13 ENCOUNTER — Ambulatory Visit (INDEPENDENT_AMBULATORY_CARE_PROVIDER_SITE_OTHER): Payer: Medicaid Other | Admitting: *Deleted

## 2018-12-13 ENCOUNTER — Other Ambulatory Visit: Payer: Self-pay

## 2018-12-13 VITALS — BP 127/82 | HR 87 | Ht 68.0 in | Wt 196.1 lb

## 2018-12-13 DIAGNOSIS — Z3042 Encounter for surveillance of injectable contraceptive: Secondary | ICD-10-CM | POA: Diagnosis present

## 2018-12-13 MED ORDER — MEDROXYPROGESTERONE ACETATE 150 MG/ML IM SUSP
150.0000 mg | Freq: Once | INTRAMUSCULAR | Status: AC
  Administered 2018-12-13: 150 mg via INTRAMUSCULAR

## 2018-12-13 NOTE — Progress Notes (Signed)
I have reviewed the chart and agree with nursing staff's documentation of this patient's encounter.  Thressa Sheller DNP, CNM  12/13/18  4:43 PM

## 2018-12-13 NOTE — Progress Notes (Signed)
Natalie Riggs here for Depo-Provera  Injection.  Injection administered without complication. Patient will return in 3 months for next injection.  Pt also advised that she will need a pap smear at her next visit.   Osvaldo Human, RN 12/13/2018  2:22 PM

## 2019-02-28 ENCOUNTER — Ambulatory Visit (INDEPENDENT_AMBULATORY_CARE_PROVIDER_SITE_OTHER): Payer: Medicaid Other

## 2019-02-28 ENCOUNTER — Other Ambulatory Visit: Payer: Self-pay

## 2019-02-28 DIAGNOSIS — Z3042 Encounter for surveillance of injectable contraceptive: Secondary | ICD-10-CM

## 2019-02-28 MED ORDER — MEDROXYPROGESTERONE ACETATE 150 MG/ML IM SUSP
150.0000 mg | Freq: Once | INTRAMUSCULAR | Status: AC
  Administered 2019-02-28: 150 mg via INTRAMUSCULAR

## 2019-02-28 NOTE — Progress Notes (Signed)
Natalie Riggs here for Depo-Provera  Injection.  Injection administered without complication. Patient will return in 3 months for next injection.  Ralene Bathe, RN 02/28/2019  2:33 PM

## 2019-02-28 NOTE — Progress Notes (Signed)
Agree with A & P. 

## 2019-03-01 ENCOUNTER — Encounter: Payer: Self-pay | Admitting: *Deleted

## 2019-05-15 ENCOUNTER — Telehealth: Payer: Self-pay | Admitting: Family Medicine

## 2019-05-15 NOTE — Telephone Encounter (Signed)
Spoke with patient about her appointment on 8/13 @ 1:30. Patient instructed to wear a face mask for the entire appointment and no visitors are allowed during the visit. Patient was screened for covid symptoms and denied having any

## 2019-05-16 ENCOUNTER — Other Ambulatory Visit: Payer: Self-pay

## 2019-05-16 ENCOUNTER — Ambulatory Visit (INDEPENDENT_AMBULATORY_CARE_PROVIDER_SITE_OTHER): Payer: Medicaid Other

## 2019-05-16 VITALS — Wt 190.0 lb

## 2019-05-16 DIAGNOSIS — Z3042 Encounter for surveillance of injectable contraceptive: Secondary | ICD-10-CM | POA: Diagnosis present

## 2019-05-16 MED ORDER — MEDROXYPROGESTERONE ACETATE 150 MG/ML IM SUSP
150.0000 mg | Freq: Once | INTRAMUSCULAR | Status: AC
  Administered 2019-05-16: 14:00:00 150 mg via INTRAMUSCULAR

## 2019-05-16 NOTE — Progress Notes (Signed)
Ashok Cordia here for Depo-Provera  Injection.  Injection administered without complication. Patient will return in 3 months for next injection.  [place], Cornelius 05/16/2019  2:04 PM

## 2019-05-17 IMAGING — US US MFM OB FOLLOW-UP
1 series · 14 of 28 positions shown · non-contrast
Comparison: none

[Series 1: us mfm ob follow-up · 84 acquisitions, 14 frames shown]
[im 4/84]
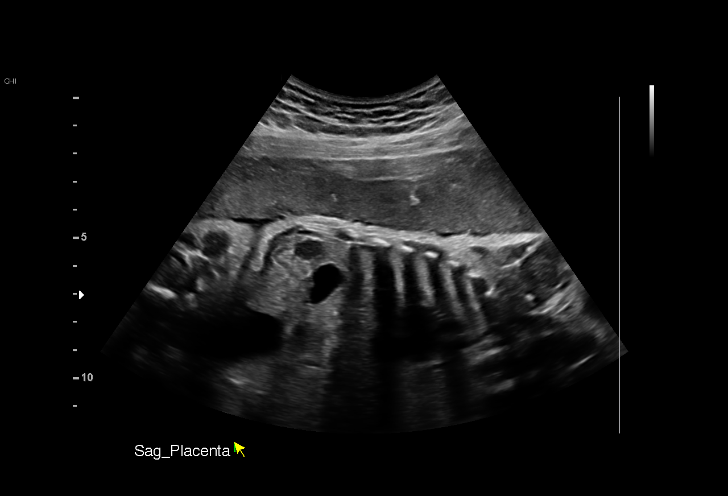
[im 10/84]
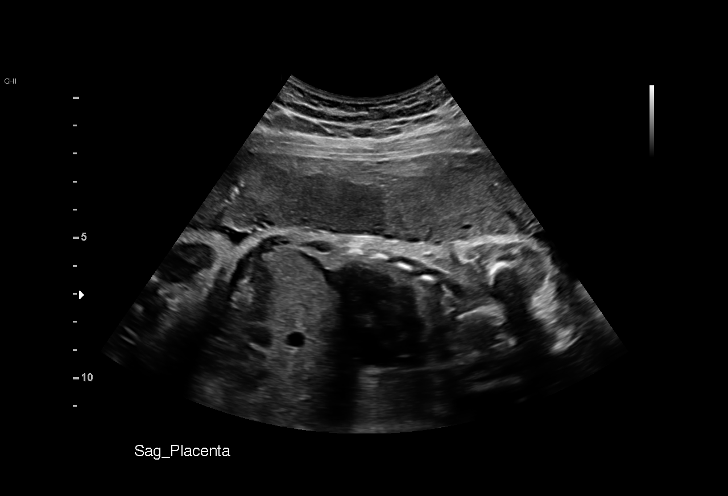
[im 16/84]
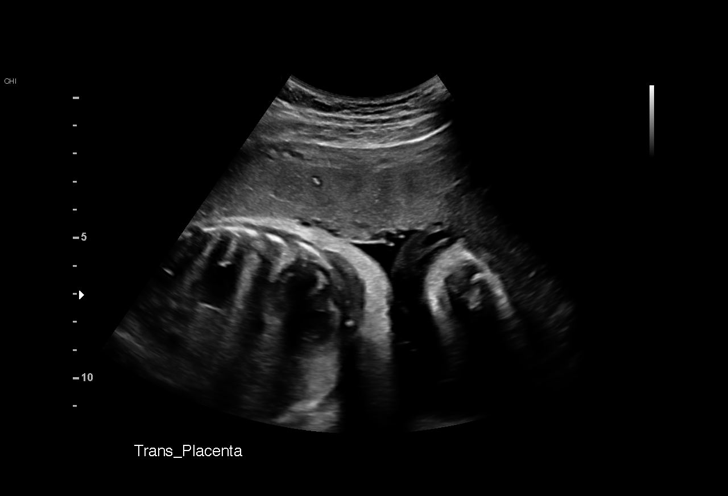
[im 22/84]
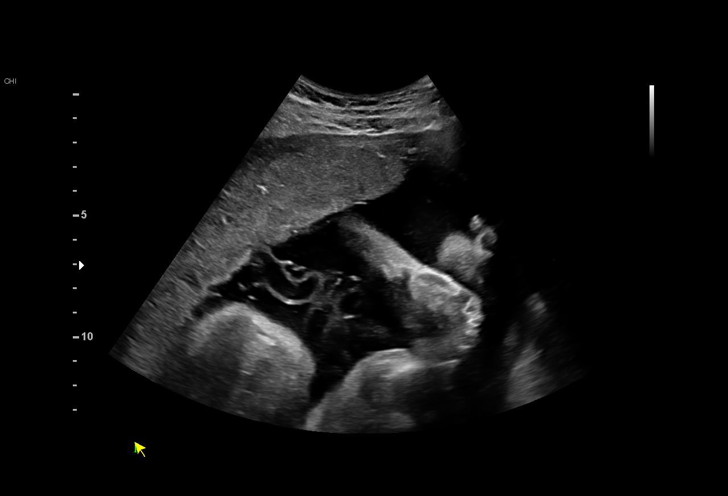
[im 28/84]
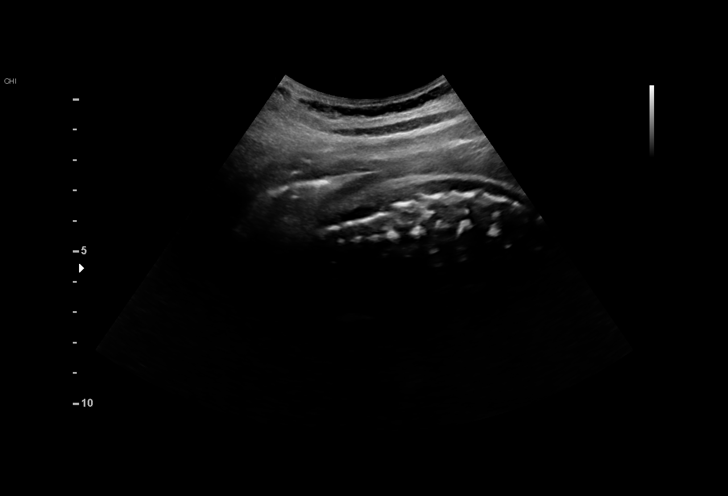
[im 34/84]
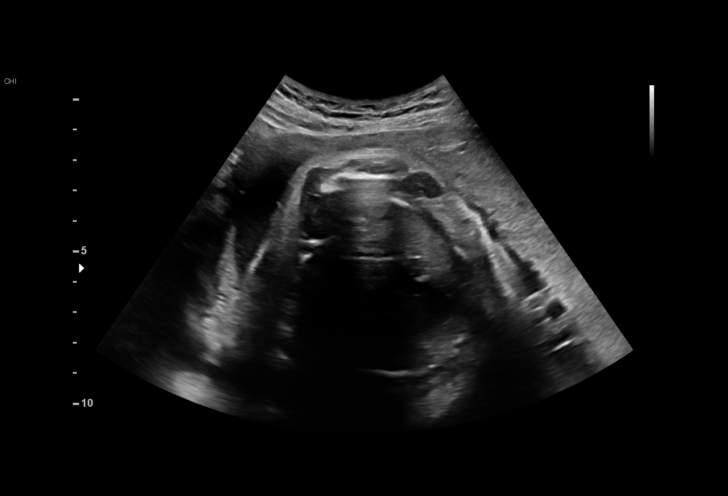
[im 40/84]
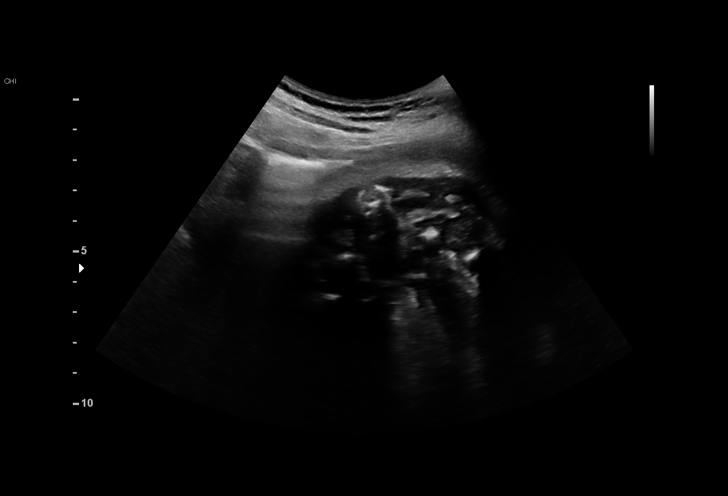
[im 47/84]
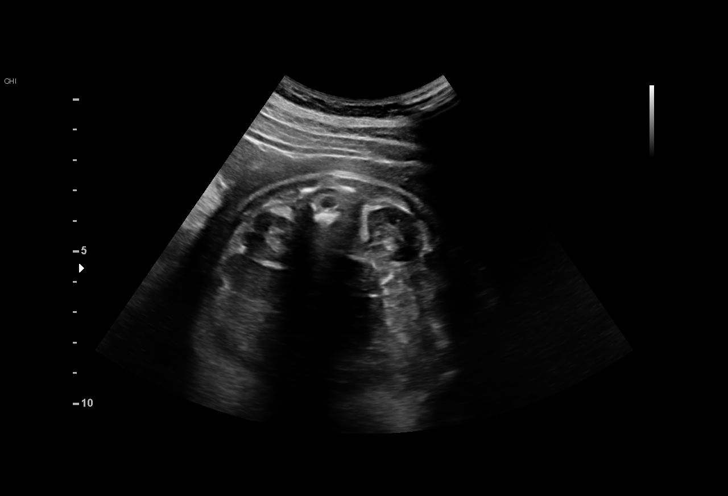
[im 53/84]
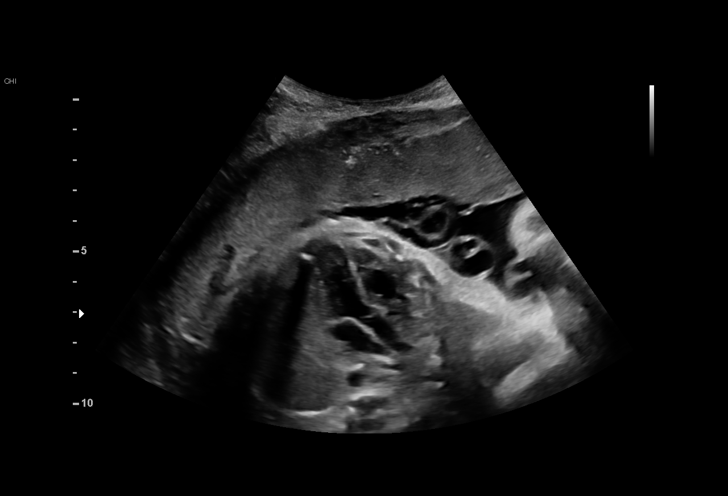
[im 59/84]
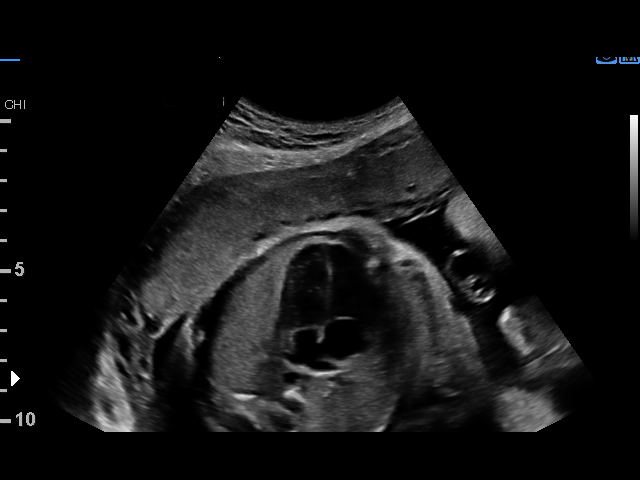
[im 65/84]
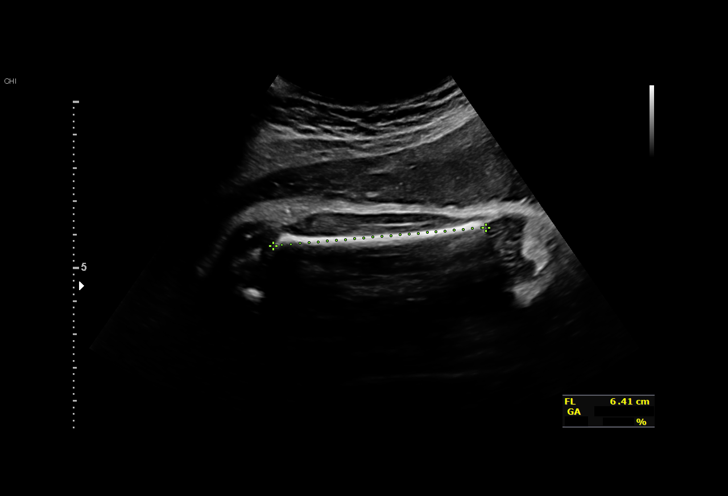
[im 71/84]
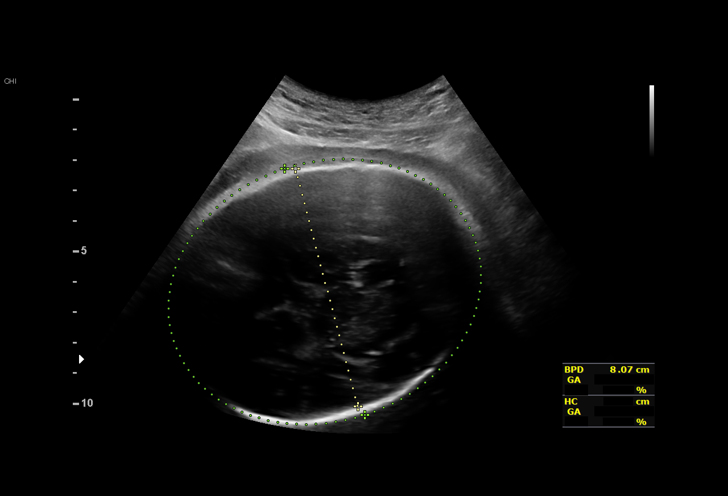
[im 77/84]
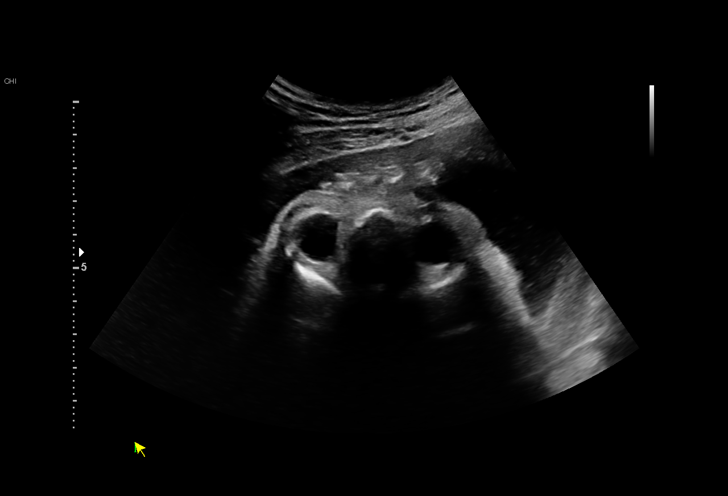
[im 84/84]
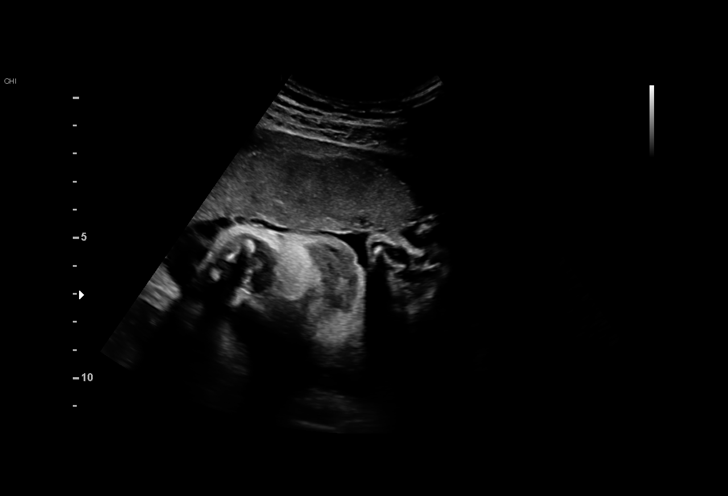

[14 of 28 positions shown; findings below may reference images not displayed]

[facility] [HOSPITAL]

Indications

33 weeks gestation of pregnancy
Late to prenatal care, third trimester
OB History

Blood Type:            Height:  5'7"   Weight (lb):  177       BMI:
Gravidity:    5         Term:   3
Fetal Evaluation

Num Of Fetuses:     1
Fetal Heart         140
Rate(bpm):
Cardiac Activity:   Observed
Presentation:       Cephalic
Placenta:           Anterior
P. Cord Insertion:  Visualized, central

Amniotic Fluid
AFI FV:      Subjectively within normal limits

AFI Sum(cm)     %Tile       Largest Pocket(cm)
16.02           58

RUQ(cm)       RLQ(cm)       LUQ(cm)        LLQ(cm)
2.49
Biometry

BPD:      80.4  mm     G. Age:  32w 2d         20  %    CI:        73.53   %    70 - 86
FL/HC:      21.7   %    19.9 -
HC:      297.9  mm     G. Age:  33w 0d         13  %    HC/AC:      1.00        0.96 -
AC:      296.5  mm     G. Age:  33w 5d         65  %    FL/BPD:     80.3   %    71 - 87
FL:       64.6  mm     G. Age:  33w 2d         44  %    FL/AC:      21.8   %    20 - 24
Est. FW:    8114  gm    4 lb 13 oz      62  %
Gestational Age

LMP:           32w 1d        Date:  09/21/17                 EDD:   06/28/18
U/S Today:     33w 1d                                        EDD:   06/21/18
Best:          33w 1d     Det. By:  U/S  (04/02/18)          EDD:   06/21/18
Anatomy

Cranium:               Appears normal         Aortic Arch:            Previously seen
Cavum:                 Appears normal         Ductal Arch:            Appears normal
Ventricles:            Appears normal         Diaphragm:              Previously seen
Choroid Plexus:        Previously seen        Stomach:                Appears normal, left
sided
Cerebellum:            Previously seen        Abdomen:                Appears normal
Posterior Fossa:       Previously seen        Abdominal Wall:         Not well visualized
Nuchal Fold:           Not applicable (>20    Cord Vessels:           Appears normal (3
wks GA)                                        vessel cord)
Face:                  Orbits and profile     Kidneys:                Appear normal
previously seen
Lips:                  Previously seen        Bladder:                Appears normal
Thoracic:              Appears normal         Spine:                  Appears normal
Heart:                 Appears normal         Upper Extremities:      Appears normal
(4CH, axis, and situs
RVOT:                  Appears normal         Lower Extremities:      Appears normal
LVOT:                  Appears normal

Other:  Fetus appears to be a female. Nasal bone previously visualized. Rt
heel previously visualized.
Cervix Uterus Adnexa

Cervix
Not visualized (advanced GA >32wks)
Impression

Late prenatal care.
Amniotic fluid is normal and good fetal activity is seen. Fetal
growth is appropriate for gestational age. Good interval fetal
growth is seen.
Recommendations

Follow-up scans as clinically indicated.

## 2019-08-01 ENCOUNTER — Encounter: Payer: Self-pay | Admitting: Obstetrics and Gynecology

## 2019-08-01 ENCOUNTER — Ambulatory Visit: Payer: Medicaid Other

## 2019-08-13 ENCOUNTER — Ambulatory Visit: Payer: Medicaid Other

## 2019-08-13 ENCOUNTER — Telehealth: Payer: Self-pay | Admitting: Student

## 2019-08-13 NOTE — Telephone Encounter (Signed)
The patient called in stating she does not have a ride to the appointment today. Offered the patient the after hours clinic. She agreed and stated she will walk in.

## 2019-08-19 ENCOUNTER — Ambulatory Visit (INDEPENDENT_AMBULATORY_CARE_PROVIDER_SITE_OTHER): Payer: Medicaid Other | Admitting: *Deleted

## 2019-08-19 ENCOUNTER — Other Ambulatory Visit: Payer: Self-pay

## 2019-08-19 DIAGNOSIS — Z3042 Encounter for surveillance of injectable contraceptive: Secondary | ICD-10-CM

## 2019-08-19 LAB — POCT PREGNANCY, URINE: Preg Test, Ur: NEGATIVE

## 2019-08-19 MED ORDER — MEDROXYPROGESTERONE ACETATE 150 MG/ML IM SUSP
150.0000 mg | Freq: Once | INTRAMUSCULAR | Status: AC
  Administered 2019-08-19: 18:00:00 150 mg via INTRAMUSCULAR

## 2019-08-19 NOTE — Progress Notes (Addendum)
Here for Depo injection. Did have intercourse a wk ago but states used condoms. UPT negative today. Depo given without diffculty. Will get Depo in 3 months   Chart reviewed for nurse visit. Agree with plan of care.   Virginia Rochester, NP 08/19/2019 10:22 PM

## 2019-11-04 ENCOUNTER — Ambulatory Visit: Payer: Medicaid Other

## 2019-11-27 ENCOUNTER — Ambulatory Visit: Payer: Medicaid Other

## 2019-11-29 ENCOUNTER — Ambulatory Visit (INDEPENDENT_AMBULATORY_CARE_PROVIDER_SITE_OTHER): Payer: Medicaid Other | Admitting: Lactation Services

## 2019-11-29 ENCOUNTER — Other Ambulatory Visit: Payer: Self-pay

## 2019-11-29 VITALS — Ht 68.0 in | Wt 191.7 lb

## 2019-11-29 DIAGNOSIS — Z3042 Encounter for surveillance of injectable contraceptive: Secondary | ICD-10-CM

## 2019-11-29 LAB — POCT PREGNANCY, URINE: Preg Test, Ur: NEGATIVE

## 2019-11-29 MED ORDER — MEDROXYPROGESTERONE ACETATE 150 MG/ML IM SUSP
150.0000 mg | Freq: Once | INTRAMUSCULAR | Status: AC
  Administered 2019-11-29: 12:00:00 150 mg via INTRAMUSCULAR

## 2019-11-29 NOTE — Progress Notes (Signed)
Natalie Riggs here for Depo Provera Injection.  Injection administered without complication. Patient will return in 3 months for next injection. Pt to return between May 14-28.   Patient is late for this dose. Patient reports she has been abstaining from intercourse. Urine Pregnancy test negative. Pt advised to use back up birth control or abstenance for the next 7 days to prevent pregnancy. Patient voiced understanding.   Ed Blalock, RN 11/29/2019  11:40 AM

## 2019-12-02 NOTE — Progress Notes (Signed)
Patient seen and assessed by nursing staff during this encounter. I have reviewed the chart and agree with the documentation and plan.  Byrd Rushlow, MD 12/02/2019 9:33 AM   

## 2020-02-17 ENCOUNTER — Ambulatory Visit: Payer: Medicaid Other

## 2020-04-27 ENCOUNTER — Ambulatory Visit (INDEPENDENT_AMBULATORY_CARE_PROVIDER_SITE_OTHER): Payer: Medicaid Other | Admitting: *Deleted

## 2020-04-27 ENCOUNTER — Other Ambulatory Visit: Payer: Self-pay

## 2020-04-27 DIAGNOSIS — Z3202 Encounter for pregnancy test, result negative: Secondary | ICD-10-CM

## 2020-04-27 LAB — POCT PREGNANCY, URINE: Preg Test, Ur: NEGATIVE

## 2020-04-27 NOTE — Progress Notes (Signed)
Pt came in earlier and left urine for UPT. UPT today is negative. Called pt with results and received "not in service recording". Letter stating negative UPT today sent.

## 2020-04-28 NOTE — Progress Notes (Signed)
Agree with A & P. 

## 2020-06-16 ENCOUNTER — Ambulatory Visit: Payer: Medicaid Other | Admitting: Certified Nurse Midwife

## 2020-08-05 ENCOUNTER — Ambulatory Visit: Payer: Medicaid Other | Admitting: Advanced Practice Midwife

## 2020-10-01 ENCOUNTER — Ambulatory Visit
Admission: EM | Admit: 2020-10-01 | Discharge: 2020-10-01 | Disposition: A | Discharge status: Home / Self Care | Payer: Medicaid Other | Attending: Family Medicine | Admitting: Family Medicine

## 2020-10-01 DIAGNOSIS — R509 Fever, unspecified: Secondary | ICD-10-CM

## 2020-10-01 DIAGNOSIS — R519 Headache, unspecified: Secondary | ICD-10-CM | POA: Diagnosis not present

## 2020-10-01 DIAGNOSIS — R000 Tachycardia, unspecified: Secondary | ICD-10-CM

## 2020-10-01 DIAGNOSIS — O98512 Other viral diseases complicating pregnancy, second trimester: Secondary | ICD-10-CM | POA: Diagnosis not present

## 2020-10-01 DIAGNOSIS — E860 Dehydration: Secondary | ICD-10-CM

## 2020-10-01 DIAGNOSIS — R52 Pain, unspecified: Secondary | ICD-10-CM

## 2020-10-01 DIAGNOSIS — B349 Viral infection, unspecified: Secondary | ICD-10-CM

## 2020-10-01 DIAGNOSIS — R112 Nausea with vomiting, unspecified: Secondary | ICD-10-CM

## 2020-10-01 DIAGNOSIS — R0682 Tachypnea, not elsewhere classified: Secondary | ICD-10-CM

## 2020-10-01 DIAGNOSIS — Z3A19 19 weeks gestation of pregnancy: Secondary | ICD-10-CM | POA: Diagnosis not present

## 2020-10-01 DIAGNOSIS — Z331 Pregnant state, incidental: Secondary | ICD-10-CM

## 2020-10-01 DIAGNOSIS — R6883 Chills (without fever): Secondary | ICD-10-CM | POA: Diagnosis not present

## 2020-10-01 LAB — POCT INFLUENZA A/B
Influenza A, POC: NEGATIVE
Influenza B, POC: NEGATIVE

## 2020-10-01 MED ORDER — ONDANSETRON HCL 4 MG/2ML IJ SOLN
4.0000 mg | Freq: Once | INTRAMUSCULAR | Status: AC
  Administered 2020-10-01: 11:00:00 4 mg via INTRAMUSCULAR

## 2020-10-01 MED ORDER — ACETAMINOPHEN 325 MG PO TABS
650.0000 mg | ORAL_TABLET | Freq: Once | ORAL | Status: AC
  Administered 2020-10-01: 650 mg via ORAL

## 2020-10-01 MED ORDER — SODIUM CHLORIDE 0.9 % IV BOLUS
1000.0000 mL | Freq: Once | INTRAVENOUS | Status: AC
  Administered 2020-10-01: 11:00:00 1000 mL via INTRAVENOUS

## 2020-10-01 NOTE — Discharge Instructions (Addendum)
Rapid flu is negative today  We have given you medication in the office for nausea and a fluid bolus to help keep you hydrated  Your COVID and Flu tests are pending.  You should self quarantine until the test results are back.    Take Tylenol or ibuprofen as needed for fever or discomfort.  Rest and keep yourself hydrated.    Follow-up with your primary care provider if your symptoms are not improving.

## 2020-10-01 NOTE — ED Triage Notes (Signed)
Pt states that she has a headache, vomiting, and bodyaches. x1 day. Pt states that she is vaccinated.

## 2020-10-01 NOTE — ED Provider Notes (Signed)
[facility]   [id number] 10/01/20 Arrival Time: 1013   CC: COVID symptoms  SUBJECTIVE: History from: patient.  Natalie Riggs is a 29 y.o. female who presents with abrupt onset of nausea, body aches, chills, headache for the last day.  Reports that she is [redacted] weeks pregnant.  Denies sick exposure to COVID, flu or strep. Denies recent travel. Has negative history of Covid. Has completed Covid vaccines. Has taken Tylenol with little relief. There are no aggravating or alleviating factors. Denies previous symptoms in the past. Denies sinus pain, SOB, wheezing, chest pain, changes in bowel or bladder habits.    ROS: As per HPI.  All other pertinent ROS negative.     Past Medical History:  Diagnosis Date  . Miscarriage    Past Surgical History:  Procedure Laterality Date  . DILATATION & CURETTAGE/HYSTEROSCOPY WITH MYOSURE     No Known Allergies No current facility-administered medications on file prior to encounter.   No current outpatient medications on file prior to encounter.   Social History   Socioeconomic History  . Marital status: Single    Spouse name: Not on file  . Number of children: Not on file  . Years of education: Not on file  . Highest education level: Not on file  Occupational History  . Not on file  Tobacco Use  . Smoking status: Former Smoker    Packs/day: 0.00  . Smokeless tobacco: Never Used  Vaping Use  . Vaping Use: Never used  Substance and Sexual Activity  . Alcohol use: No  . Drug use: No  . Sexual activity: Yes    Birth control/protection: None  Other Topics Concern  . Not on file  Social History Narrative  . Not on file   Social Determinants of Health   Financial Resource Strain: Not on file  Food Insecurity: Not on file  Transportation Needs: Not on file  Physical Activity: Not on file  Stress: Not on file  Social Connections: Not on file  Intimate Partner Violence: Not on file   Family History  Problem Relation Age  of Onset  . Asthma Brother     OBJECTIVE:  Vitals:   10/01/20 1032 10/01/20 1048 10/01/20 1210  BP:  127/75   Pulse:  (!) 129 (!) 111  Resp:  (!) 22   Temp:  (!) 103.1 F (39.5 C) (!) 101.4 F (38.6 C)  TempSrc:  Oral Oral  SpO2:  99%   Weight: 191 lb (86.6 kg)    Height: 5\' 7"  (1.702 m)       General appearance: alert; appears fatigued, but nontoxic; speaking in full sentences and tolerating own secretions, febrile in office HEENT: NCAT; Ears: EACs clear, TMs pearly gray; Eyes: PERRL.  EOM grossly intact. Sinuses: nontender; Nose: nares patent without rhinorrhea, Throat: oropharynx erythematous, cobblestoning present, tonsils non erythematous or enlarged, uvula midline  Neck: supple with LAD Lungs: unlabored respirations, symmetrical air entry; cough: mild; no respiratory distress; CTAB Heart: regular rate and rhythm.  Radial pulses 2+ symmetrical bilaterally Skin: warm and dry Psychological: alert and cooperative; normal mood and affect  LABS:  No results found for this or any previous visit (from the past 24 hour(s)).   ASSESSMENT & PLAN:  1. Nonintractable headache, unspecified chronicity pattern, unspecified headache type   2. Nausea and vomiting, intractability of vomiting not specified, unspecified vomiting type   3. Body aches   4. Chills   5. Pregnant state, incidental   6. Viral illness  7. Fever, unspecified fever cause   8. Tachycardia   9. Tachypnea   10. Dehydration     Meds ordered this encounter  Medications  . acetaminophen (TYLENOL) tablet 650 mg  . sodium chloride 0.9 % bolus 1,000 mL  . ondansetron (ZOFRAN) injection 4 mg   Tylenol in office for fever NS bolus given in office today Zofran in office today Continue supportive care at home COVID and flu testing ordered.  It will take between 1-2 days for test results.  Someone will contact you regarding abnormal results.   Work note provided Patient should remain in quarantine until  they have received Covid results.  If negative you may resume normal activities (go back to work/school) while practicing hand hygiene, social distance, and mask wearing.  If positive, patient should remain in quarantine for 10 days from symptom onset AND greater than 72 hours after symptoms resolution (absence of fever without the use of fever-reducing medication and improvement in respiratory symptoms), whichever is longer Get plenty of rest and push fluids Use OTC zyrtec for nasal congestion, runny nose, and/or sore throat Use OTC flonase for nasal congestion and runny nose Use medications daily for symptom relief Use OTC medications like ibuprofen or tylenol as needed fever or pain Call or go to the [facility] if you have any new or worsening symptoms such as fever, worsening cough, shortness of breath, chest tightness, chest pain, turning blue, changes in mental status.  Reviewed expectations re: course of current medical issues. Questions answered. Outlined signs and symptoms indicating need for more acute intervention. Patient verbalized understanding. After Visit Summary given.         Moshe Cipro, NP 10/04/20 [phone]

## 2020-10-03 LAB — COVID-19, FLU A+B NAA
Influenza A, NAA: NOT DETECTED
Influenza B, NAA: NOT DETECTED
SARS-CoV-2, NAA: DETECTED — AB

## 2020-10-26 ENCOUNTER — Encounter: Payer: Self-pay | Admitting: Emergency Medicine

## 2020-10-26 ENCOUNTER — Ambulatory Visit
Admission: EM | Admit: 2020-10-26 | Discharge: 2020-10-26 | Disposition: A | Discharge status: Home / Self Care | Payer: Medicaid Other | Attending: Emergency Medicine | Admitting: Emergency Medicine

## 2020-10-26 ENCOUNTER — Other Ambulatory Visit: Payer: Self-pay

## 2020-10-26 DIAGNOSIS — Z3202 Encounter for pregnancy test, result negative: Secondary | ICD-10-CM | POA: Diagnosis not present

## 2020-10-26 DIAGNOSIS — N939 Abnormal uterine and vaginal bleeding, unspecified: Secondary | ICD-10-CM | POA: Diagnosis not present

## 2020-10-26 LAB — POCT URINE PREGNANCY: Preg Test, Ur: NEGATIVE

## 2020-10-26 MED ORDER — MEGESTROL ACETATE 40 MG PO TABS: 40.0000 mg | ORAL_TABLET | Freq: Two times a day (BID) | ORAL | 0 refills | Status: DC

## 2020-10-26 MED ORDER — ACETAMINOPHEN 325 MG PO TABS: 650.0000 mg | ORAL_TABLET | Freq: Four times a day (QID) | ORAL | 0 refills | Status: DC | PRN

## 2020-10-26 NOTE — ED Triage Notes (Signed)
Has positive pregnancy test 2 months ago   Had bleeding during Christmas time, now states she is bleeding heavily.  Back pain and abd pain

## 2020-10-26 NOTE — Discharge Instructions (Addendum)
Megace was prescribed/take as directed Tylenol was prescribed/take as directed Follow-up with OB/GYN for further evaluation Return or go to ED if you develop any new or worsening of your symptoms

## 2020-10-26 NOTE — ED Provider Notes (Signed)
[facility]   [id number] 10/26/20 Arrival Time: 1652   Chief Complaint  Patient presents with  . Vaginal Bleeding     SUBJECTIVE: History from: patient.  Natalie Riggs is a 30 y.o. female presented to the urgent care with a complaint of heavy vaginal bleeding, back pain abdominal pain that started today.  She reports she was on Depo and have not get a shot since November.  Has been bleeding heavily and has saturated more than  6 pads so far today. Denies any alleviating or aggravating factors.  Reports similar symptoms in the past.   Denies fever, chills, fatigue, nausea, vomiting, vaginal bleeding, dysuria, dyspareunia, vaginal odor, vaginal itching.  ROS: As per HPI.  All other pertinent ROS negative.      Past Medical History:  Diagnosis Date  . Miscarriage    Past Surgical History:  Procedure Laterality Date  . DILATATION & CURETTAGE/HYSTEROSCOPY WITH MYOSURE     No Known Allergies No current facility-administered medications on file prior to encounter.   No current outpatient medications on file prior to encounter.   Social History   Socioeconomic History  . Marital status: Single    Spouse name: Not on file  . Number of children: Not on file  . Years of education: Not on file  . Highest education level: Not on file  Occupational History  . Not on file  Tobacco Use  . Smoking status: Former Smoker    Packs/day: 0.00  . Smokeless tobacco: Never Used  Vaping Use  . Vaping Use: Never used  Substance and Sexual Activity  . Alcohol use: No  . Drug use: No  . Sexual activity: Yes    Birth control/protection: None  Other Topics Concern  . Not on file  Social History Narrative  . Not on file   Social Determinants of Health   Financial Resource Strain: Not on file  Food Insecurity: Not on file  Transportation Needs: Not on file  Physical Activity: Not on file  Stress: Not on file  Social Connections: Not on file  Intimate Partner Violence:  Not on file   Family History  Problem Relation Age of Onset  . Asthma Brother     OBJECTIVE:  Vitals:   10/26/20 1701 10/26/20 1702  BP: 133/80   Pulse: (!) 108   Resp: 18   Temp: 98.9 F (37.2 C)   TempSrc: Oral   SpO2: 97%   Weight:  191 lb (86.6 kg)  Height:  5\' 8"  (1.727 m)     Physical Exam Vitals and nursing note reviewed.  Constitutional:      General: She is not in acute distress.    Appearance: Normal appearance. She is normal weight. She is not ill-appearing, toxic-appearing or diaphoretic.  HENT:     Head: Normocephalic.  Cardiovascular:     Rate and Rhythm: Normal rate and regular rhythm.     Pulses: Normal pulses.     Heart sounds: Normal heart sounds. No murmur heard. No friction rub. No gallop.   Pulmonary:     Effort: Pulmonary effort is normal. No respiratory distress.     Breath sounds: Normal breath sounds. No stridor. No wheezing, rhonchi or rales.  Chest:     Chest wall: No tenderness.  Genitourinary:    Comments: deferred Neurological:     Mental Status: She is alert and oriented to person, place, and time.     LABS:  Results for orders placed or performed during the  hospital encounter of 10/26/20 (from the past 24 hour(s))  POCT urine pregnancy     Status: None   Collection Time: 10/26/20  5:20 PM  Result Value Ref Range   Preg Test, Ur Negative Negative     ASSESSMENT & PLAN:  1. Urine pregnancy test negative   2. Vaginal bleeding     Meds ordered this encounter  Medications  . megestrol (MEGACE) 40 MG tablet    Sig: Take 1 tablet (40 mg total) by mouth 2 (two) times daily.    Dispense:  30 tablet    Refill:  0  . acetaminophen (TYLENOL) 325 MG tablet    Sig: Take 2 tablets (650 mg total) by mouth every 6 (six) hours as needed.    Dispense:  30 tablet    Refill:  0   Patient is hemodynamically stable at discharge.  Medication was prescribed and she was advised to follow-up with OB/GYN.  Discharge Instructions  Megace  was prescribed/take as directed Tylenol was prescribed/take as directed Follow-up with OB/GYN for further evaluation Return or go to ED if you develop any new or worsening of your symptoms   Reviewed expectations re: course of current medical issues. Questions answered. Outlined signs and symptoms indicating need for more acute intervention. Patient verbalized understanding. After Visit Summary given.         Durward Parcel, FNP 10/26/20 1747

## 2020-10-29 ENCOUNTER — Other Ambulatory Visit: Payer: Self-pay

## 2020-10-29 ENCOUNTER — Ambulatory Visit (INDEPENDENT_AMBULATORY_CARE_PROVIDER_SITE_OTHER): Payer: Medicaid Other

## 2020-10-29 DIAGNOSIS — Z3202 Encounter for pregnancy test, result negative: Secondary | ICD-10-CM | POA: Diagnosis not present

## 2020-10-29 LAB — POCT PREGNANCY, URINE: Preg Test, Ur: NEGATIVE

## 2020-10-29 MED ORDER — MEGESTROL ACETATE 40 MG PO TABS: 40.0000 mg | ORAL_TABLET | Freq: Two times a day (BID) | ORAL | 0 refills | Status: DC

## 2020-10-29 NOTE — Progress Notes (Signed)
Chart reviewed for nurse visit. Agree with plan of care.   Sharyon Cable, CNM 10/29/2020 1:34 PM

## 2020-10-29 NOTE — Progress Notes (Signed)
Pt here today for UPT.  UPT today was negative. Pt aware and verbalized understanding.    Pt states has been having irregular heavy vaginal bleeding for the last 2-3 months. Pt changing pad every 2-3 hours to be clean, not due to soaking. Pt denies feeling dizzy, weak, lightheaded. Had 1 small medium flat clot today when wiping. Pt has been seen at [facility] on 10/26/20 and was given Megace Rx. States has not picked up the Rx due to wrong pharmacy. Resent Rx to pharmacy in [place].    Pt states would like to restart birth control and possibly Depo Provera again. Pt advised will need annual exam to have birth control.  Pt states considering IUD or Nexplanon. Pt advised to make annual exam at front office today with next available provider. Pt agreeable.   Pt made annual exam appt for 11/02/20.   Judeth Cornfield, RN 10/29/20

## 2020-11-02 ENCOUNTER — Encounter: Payer: Self-pay | Admitting: Certified Nurse Midwife

## 2020-11-02 ENCOUNTER — Ambulatory Visit: Payer: Medicaid Other | Admitting: Certified Nurse Midwife

## 2020-11-02 ENCOUNTER — Ambulatory Visit (INDEPENDENT_AMBULATORY_CARE_PROVIDER_SITE_OTHER): Payer: Medicaid Other | Admitting: Certified Nurse Midwife

## 2020-11-02 ENCOUNTER — Other Ambulatory Visit: Payer: Self-pay

## 2020-11-02 DIAGNOSIS — Z7251 High risk heterosexual behavior: Secondary | ICD-10-CM

## 2020-11-02 NOTE — Progress Notes (Signed)
Here for annual exam and birth control.  Late for appointment. Per provider ask if has had unprotected intercourse last 2 weeks and if she has , reschedule. Per patient had depo-provera last in August.States has had unprotected intercourse once in last 2 weeks. Explained we will need to reschedule her. Also discussed I cannot verify depo-provera in August, last was February. Advised patient to reschedule and to use condoms if she has intercourse. Ismeal Heider,RN

## 2020-11-02 NOTE — Progress Notes (Signed)
Annual and Depo rescheduled due to recent unprotected IC. Patient not seen by provider today.    Sharyon Cable, CNM 11/02/20, 11:30 AM

## 2020-11-10 ENCOUNTER — Telehealth: Payer: Self-pay | Admitting: Family Medicine

## 2020-11-10 ENCOUNTER — Ambulatory Visit: Payer: Medicaid Other | Admitting: Certified Nurse Midwife

## 2020-11-10 NOTE — Telephone Encounter (Signed)
I called pt to resch appt but # not in service

## 2020-11-24 ENCOUNTER — Ambulatory Visit: Payer: Medicaid Other | Admitting: Family Medicine

## 2022-05-16 ENCOUNTER — Ambulatory Visit (INDEPENDENT_AMBULATORY_CARE_PROVIDER_SITE_OTHER): Payer: Medicaid Other | Admitting: General Practice

## 2022-05-16 ENCOUNTER — Encounter: Payer: Self-pay | Admitting: General Practice

## 2022-05-16 VITALS — BP 132/72 | HR 87 | Ht 68.0 in | Wt 182.0 lb

## 2022-05-16 DIAGNOSIS — Z3201 Encounter for pregnancy test, result positive: Secondary | ICD-10-CM

## 2022-05-16 LAB — POCT PREGNANCY, URINE: Preg Test, Ur: POSITIVE — AB

## 2022-05-16 MED ORDER — PREPLUS 27-1 MG PO TABS: 1.0000 | ORAL_TABLET | Freq: Every day | ORAL | 11 refills | Status: AC

## 2022-05-16 NOTE — Progress Notes (Signed)
Patient came by the office this morning to drop off a urine sample for UPT. UPT +. Patient reports first positive home test last month. LMP 02/26/22 EDD 12/03/22 [redacted]w[redacted]d. FHR 158bpm. Allergies/meds reviewed with patient. PNV Rx sent to pharmacy per protocol. Patient will return for new OB intake & new OB appt.   Chase Caller RN BSN 05/16/22

## 2022-05-24 ENCOUNTER — Telehealth (INDEPENDENT_AMBULATORY_CARE_PROVIDER_SITE_OTHER): Payer: Medicaid Other

## 2022-05-24 DIAGNOSIS — Z3A00 Weeks of gestation of pregnancy not specified: Secondary | ICD-10-CM

## 2022-05-24 DIAGNOSIS — Z3490 Encounter for supervision of normal pregnancy, unspecified, unspecified trimester: Secondary | ICD-10-CM | POA: Insufficient documentation

## 2022-05-24 DIAGNOSIS — Z3480 Encounter for supervision of other normal pregnancy, unspecified trimester: Secondary | ICD-10-CM

## 2022-05-24 MED ORDER — BLOOD PRESSURE KIT DEVI: 1.0000 | 0 refills | Status: DC | PRN

## 2022-05-24 MED ORDER — GOJJI WEIGHT SCALE MISC: 1.0000 | 0 refills | Status: DC | PRN

## 2022-05-24 NOTE — Progress Notes (Signed)
New OB Intake  I connected with  Natalie Riggs on 05/24/22 at  1:15 PM EDT by MyChart Video Visit and verified that I am speaking with the correct person using two identifiers. Nurse is located at [facility] and pt is located at home.  I discussed the limitations, risks, security and privacy concerns of performing an evaluation and management service by telephone and the availability of in person appointments. I also discussed with the patient that there may be a patient responsible charge related to this service. The patient expressed understanding and agreed to proceed.  I explained I am completing New OB Intake today. We discussed her EDD of 11/08/2022 that is based on LMP of 02/01/2022. Pt is G7/P4. I reviewed her allergies, medications, Medical/Surgical/OB history, and appropriate screenings. I informed her of [facility] services. [facility] information placed in AVS. Based on history, this is a/an  pregnancy uncomplicated .   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 Patient given information for [facility] Healthy [facility] website for more information about [facility]. Patient is interested in water birth. Offered upcoming OB visit with CNM to discuss further.  MyChart/Babyscripts MyChart access verified. I explained pt will have some visits in office and some virtually. Babyscripts instructions given and order placed. Patient verifies receipt of registration text/e-mail. Account successfully created and app downloaded.  Blood Pressure Cuff/Weight Scale Blood pressure cuff ordered for patient to pick-up from [employer]. Explained after first prenatal appt pt will check weekly and document in Babyscripts. Patient does not  have weight scale. Weight scale ordered for patient to pick up from [employer].    Anatomy US Explained first scheduled [place] will be around 19 weeks. Anatomy US scheduled for 06/14/2022 at 09:45 AM. Pt notified to arrive at 09:30 AM.  Labs Discussed Natera genetic screening with patient. Would like both Panorama and Horizon drawn at new OB visit. Routine prenatal labs needed.  Covid Vaccine Patient has not covid vaccine.   Is patient a CenteringPregnancy candidate?  Declined Declined due to Childcare  Is patient a Mom+Baby Combined Care candidate?  Not a candidate     Social Determinants of Health Food Insecurity: Patient denies food insecurity. WIC Referral: Patient is interested in referral to [facility].  Transportation: Patient denies transportation needs. Childcare: Discussed no children allowed at ultrasound appointments. Offered childcare services; patient declines childcare services at this time.  First visit review I reviewed new OB appt with pt. I explained she will have a provider visit that includes initial OB, genetic screening, pap smear, and pelvic exam. Explained pt will be seen by Vonzella Nipple PA-C at first visit; encounter routed to appropriate provider. Explained that patient will be seen by pregnancy navigator following visit with provider.   [name], [place] 05/24/2022  1:44 PM

## 2022-06-07 ENCOUNTER — Telehealth: Payer: Self-pay | Admitting: Family Medicine

## 2022-06-08 ENCOUNTER — Ambulatory Visit: Payer: Medicaid Other

## 2022-06-14 ENCOUNTER — Ambulatory Visit: Payer: Medicaid Other | Attending: Medical

## 2022-06-14 DIAGNOSIS — Z3482 Encounter for supervision of other normal pregnancy, second trimester: Secondary | ICD-10-CM | POA: Diagnosis not present

## 2022-06-14 DIAGNOSIS — Z363 Encounter for antenatal screening for malformations: Secondary | ICD-10-CM | POA: Diagnosis not present

## 2022-06-14 DIAGNOSIS — Z3A19 19 weeks gestation of pregnancy: Secondary | ICD-10-CM | POA: Insufficient documentation

## 2022-06-14 DIAGNOSIS — Z3480 Encounter for supervision of other normal pregnancy, unspecified trimester: Secondary | ICD-10-CM | POA: Insufficient documentation

## 2022-06-20 ENCOUNTER — Encounter: Payer: Self-pay | Admitting: Medical

## 2022-06-20 ENCOUNTER — Other Ambulatory Visit (HOSPITAL_COMMUNITY)
Admission: RE | Admit: 2022-06-20 | Discharge: 2022-06-20 | Disposition: A | Discharge status: Home / Self Care | Payer: Medicaid Other | Source: Ambulatory Visit | Attending: Medical | Admitting: Medical

## 2022-06-20 ENCOUNTER — Ambulatory Visit (INDEPENDENT_AMBULATORY_CARE_PROVIDER_SITE_OTHER): Payer: Medicaid Other | Admitting: Medical

## 2022-06-20 ENCOUNTER — Other Ambulatory Visit: Payer: Self-pay

## 2022-06-20 VITALS — BP 127/64 | HR 91 | Wt 183.6 lb

## 2022-06-20 DIAGNOSIS — Z3009 Encounter for other general counseling and advice on contraception: Secondary | ICD-10-CM | POA: Diagnosis not present

## 2022-06-20 DIAGNOSIS — O139 Gestational [pregnancy-induced] hypertension without significant proteinuria, unspecified trimester: Secondary | ICD-10-CM | POA: Insufficient documentation

## 2022-06-20 DIAGNOSIS — Z8759 Personal history of other complications of pregnancy, childbirth and the puerperium: Secondary | ICD-10-CM | POA: Diagnosis not present

## 2022-06-20 DIAGNOSIS — Z3480 Encounter for supervision of other normal pregnancy, unspecified trimester: Secondary | ICD-10-CM | POA: Diagnosis present

## 2022-06-20 DIAGNOSIS — Z3482 Encounter for supervision of other normal pregnancy, second trimester: Secondary | ICD-10-CM | POA: Diagnosis not present

## 2022-06-20 DIAGNOSIS — Z3A19 19 weeks gestation of pregnancy: Secondary | ICD-10-CM | POA: Diagnosis not present

## 2022-06-20 DIAGNOSIS — O2342 Unspecified infection of urinary tract in pregnancy, second trimester: Secondary | ICD-10-CM

## 2022-06-20 MED ORDER — ASPIRIN 81 MG PO TBEC: 81.0000 mg | DELAYED_RELEASE_TABLET | Freq: Every day | ORAL | 12 refills | Status: DC

## 2022-06-20 NOTE — Progress Notes (Signed)
PRENATAL VISIT NOTE  Subjective:  Natalie Riggs is a 31 y.o. [id number] at [redacted]w[redacted]d being seen today for her first prenatal visit for this pregnancy.  She is currently monitored for the following issues for this low-risk pregnancy and has Unwanted fertility; Supervision of other normal pregnancy, antepartum; and History of gestational hypertension on their problem list.  Patient reports no complaints.  Contractions: Not present. Vag. Bleeding: None.  Movement: Present. Denies leaking of fluid.   She is planning to both breast and formula feed. Desires BTL for contraception. Discussed permanence of procedure, patient will sign consent at 28 weeks   The following portions of the patient's history were reviewed and updated as appropriate: allergies, current medications, past family history, past medical history, past social history, past surgical history and problem list.   Objective:   Vitals:   06/20/22 1529  BP: 127/64  Pulse: 91  Weight: 183 lb 9.6 oz (83.3 kg)    Fetal Status: Fetal Heart Rate (bpm): 160   Movement: Present     General:  Alert, oriented and cooperative. Patient is in no acute distress.  Skin: Skin is warm and dry. No rash noted.   Cardiovascular: Normal heart rate and rhythm noted  Respiratory: Normal respiratory effort, no problems with respiration noted. Clear to auscultation.   Abdomen: Soft, gravid, appropriate for gestational age. Normal bowel sounds. Non-tender. Pain/Pressure: Present     Pelvic: Cervical exam performed        Closed, thick Normal cervical contour, no lesions, no bleeding following pap, normal discharge  Extremities: Normal range of motion.  Edema: Trace  Mental Status: Normal mood and affect. Normal behavior. Normal judgment and thought content.    Indications for ASA therapy (per uptodate) One of the following: Previous pregnancy with preeclampsia, especially early onset and with an adverse outcome No Multifetal gestation No Chronic  hypertension No Type 1 or 2 diabetes mellitus No Chronic kidney disease No Autoimmune disease (antiphospholipid syndrome, systemic lupus erythematosus) No  Two or more of the following: Nulliparity No Obesity (body mass index >30 kg/m2) No Family history of preeclampsia in mother or sister No Age ?35 years No Sociodemographic characteristics (African American race, low socioeconomic level) Yes Personal risk factors (eg, previous pregnancy with low birth weight or small for gestational age infant, previous adverse pregnancy outcome [eg, stillbirth], interval >10 years between pregnancies) No   Assessment and Plan:  Pregnancy: [id number] at [redacted]w[redacted]d 1. Supervision of other normal pregnancy, antepartum - Cytology - PAP( [place]) - Hemoglobin A1c - Culture, OB Urine - CBC/D/Plt+RPR+Rh+ABO+RubIgG... - AFP, Serum, Open Spina Bifida - Panorama Prenatal Test Full Panel - HORIZON Custom - Normal anatomy US performed 06/14/22 - Has established peds in [place]   - Pt is interested in waterbirth.  No contraindications at this time per chart review/patient assessment.   - Pt to enroll in class, see CNMs for most visits in the office.  - Discussed waterbirth as option for low-risk pregnancy.  Reviewed conditions that may arise during pregnancy that will risk pt out of waterbirth including hypertension, diabetes, fetal growth restriction <10%ile, etc.  2. History of gestational hypertension - Comp Met (CMET) - Protein / creatinine ratio, urine - aspirin EC 81 MG tablet; Take 1 tablet (81 mg total) by mouth daily. Swallow whole.  Dispense: 30 tablet; Refill: 12  3. [redacted] weeks gestation   Preterm labor/second trimester warning symptoms and general obstetric precautions including but not limited to vaginal bleeding, contractions, leaking of fluid and  fetal movement were reviewed in detail with the patient. Please refer to After Visit Summary for other counseling recommendations.   Discussed the  normal visit cadence for prenatal care Discussed the nature of our practice with multiple providers including residents and students   Return in about 4 weeks (around 07/18/2022) for LOB, Midwife preferred.  Future Appointments  Date Time Provider [place]  07/18/2022  2:15 PM Autry-Lott, Vanice Sarah [facility]    Kerry Hough, PA-C

## 2022-06-20 NOTE — Patient Instructions (Signed)
Safe Medications in Pregnancy   Acne:  Benzoyl Peroxide  Salicylic Acid   Backache/Headache:  Tylenol: 2 regular strength every 4 hours OR               2 Extra strength every 6 hours   Colds/Coughs/Allergies:  Benadryl (alcohol free) 25 mg every 6 hours as needed  Breath right strips  Claritin  Cepacol throat lozenges  Chloraseptic throat spray  Cold-Eeze- up to three times per day  Cough drops, alcohol free  Flonase (by prescription only)  Guaifenesin  Mucinex  Robitussin DM (plain only, alcohol free)  Saline nasal spray/drops  Sudafed (pseudoephedrine) & Actifed * use only after [redacted] weeks gestation and if you do not have high blood pressure  Tylenol  Vicks Vaporub  Zinc lozenges  Zyrtec   Constipation:  Colace  Ducolax suppositories  Fleet enema  Glycerin suppositories  Metamucil  Milk of magnesia  Miralax  Senokot  Smooth move tea   Diarrhea:  Kaopectate  Imodium A-D   *NO pepto Bismol   Hemorrhoids:  Anusol  Anusol HC  Preparation H  Tucks   Indigestion:  Tums  Maalox  Mylanta  Zantac  Pepcid   Insomnia:  Benadryl (alcohol free) 25mg every 6 hours as needed  Tylenol PM  Unisom, no Gelcaps   Leg Cramps:  Tums  MagGel   Nausea/Vomiting:  Bonine  Dramamine  Emetrol  Ginger extract  Sea bands  Meclizine  Nausea medication to take during pregnancy:  Unisom (doxylamine succinate 25 mg tablets) Take one tablet daily at bedtime. If symptoms are not adequately controlled, the dose can be increased to a maximum recommended dose of two tablets daily (1/2 tablet in the morning, 1/2 tablet mid-afternoon and one at bedtime).  Vitamin B6 100mg tablets. Take one tablet twice a day (up to 200 mg per day).   Skin Rashes:  Aveeno products  Benadryl cream or 25mg every 6 hours as needed  Calamine Lotion  1% cortisone cream   Yeast infection:  Gyne-lotrimin 7  Monistat 7    **If taking multiple medications, please check labels to avoid  duplicating the same active ingredients  **take medication as directed on the label  ** Do not exceed 4000 mg of tylenol in 24 hours  **Do not take medications that contain aspirin or ibuprofen          Childbirth Education Options: [facility] Classes:  Childbirth education classes can help you get ready for a positive parenting experience. You can also meet other expectant parents and get free stuff for your baby. Each class runs for five weeks on the same night and costs $45 for the mother-to-be and her support person. Medicaid covers the cost if you are eligible. Call [phone] to register. [facility] Childbirth Education: Classes can vary in availability and schedule is subject to change. For most up-to-date information please visit [email or web address] to review and register.        Considering Waterbirth? Guide for patients at [facility] ([facility]) Why consider waterbirth? Gentle birth for babies  Less pain medicine used in labor  May allow for passive descent/less pushing  May reduce perineal tears  More mobility and instinctive maternal position changes  Increased maternal relaxation   Is waterbirth safe? What are the risks of infection, drowning or other complications? Infection:  Very low risk (3.7 % for tub vs 4.8% for bed)  7 in 8000 waterbirths with documented infection  Poorly cleaned   equipment most common cause  Slightly lower group B strep transmission rate  Drowning  Maternal:  Very low risk  Related to seizures or fainting  Newborn:  Very low risk. No evidence of increased risk of respiratory problems in multiple large studies  Physiological protection from breathing under water  Avoid underwater birth if there are any fetal complications  Once baby's head is out of the water, keep it out.  Birth complication  Some reports of cord trauma, but risk decreased by bringing baby to surface gradually   No evidence of increased risk of shoulder dystocia. Mothers can usually change positions faster in water than in a bed, possibly aiding the maneuvers to free the shoulder.   There are 2 things you MUST do to have a waterbirth with [facility]: Attend a waterbirth class at [facility] [place]   3rd Wednesday of every month from 7-9 pm (virtual during COVID) Free Register online at [email or web address] or [email or web address] or by calling [phone] Bring us the certificate from the class to your prenatal appointment or send via MyChart Meet with a midwife at 36 weeks* to see if you can still plan a waterbirth and to sign the consent.   *We also recommend that you schedule as many of your prenatal visits with a midwife as possible.    Helpful information: You may want to bring a bathing suit top to the hospital to wear during labor but this is optional.  All other supplies are provided by the hospital. Please arrive at the hospital with signs of active labor, and do not wait at home until late in labor. It takes 45 min- 1 hour for fetal monitoring, and check in to your room to take place, plus transport and filling of the waterbirth tub.    Things that would prevent you from having a waterbirth: Premature, <37wks  Previous cesarean birth  Presence of thick meconium-stained fluid  Multiple gestation (Twins, triplets, etc.)  Uncontrolled diabetes or gestational diabetes requiring medication  Hypertension diagnosed in pregnancy or preexisting hypertension (gestational hypertension, preeclampsia, or chronic hypertension) Fetal growth restriction (your baby measures less than 10th percentile on ultrasound) Heavy vaginal bleeding  Non-reassuring fetal heart rate  Active infection (MRSA, etc.). Group B Strep is NOT a contraindication for waterbirth.  If your labor has to be induced and induction method requires continuous monitoring of the baby's heart rate  Other  risks/issues identified by your obstetrical provider   Please remember that birth is unpredictable. Under certain unforeseeable circumstances your provider may advise against giving birth in the tub. These decisions will be made on a case-by-case basis and with the safety of you and your baby as our highest priority.    Updated 01/05/22  

## 2022-06-22 LAB — CYTOLOGY - PAP
Chlamydia: NEGATIVE
Comment: NEGATIVE
Comment: NEGATIVE
Comment: NORMAL
Diagnosis: NEGATIVE
High risk HPV: NEGATIVE
Neisseria Gonorrhea: NEGATIVE

## 2022-06-23 LAB — HCV INTERPRETATION

## 2022-06-23 LAB — COMPREHENSIVE METABOLIC PANEL
ALT: 10 IU/L (ref 0–32)
AST: 9 IU/L (ref 0–40)
Albumin/Globulin Ratio: 1.3 (ref 1.2–2.2)
Albumin: 3.9 g/dL — ABNORMAL LOW (ref 4.0–5.0)
Alkaline Phosphatase: 60 IU/L (ref 44–121)
BUN/Creatinine Ratio: 21 (ref 9–23)
BUN: 11 mg/dL (ref 6–20)
Bilirubin Total: <0.2 mg/dL (ref 0.0–1.2)
CO2: 18 mmol/L — ABNORMAL LOW (ref 20–29)
Calcium: 9.3 mg/dL (ref 8.7–10.2)
Chloride: 106 mmol/L (ref 96–106)
Creatinine, Ser: 0.53 mg/dL — ABNORMAL LOW (ref 0.57–1.00)
Globulin, Total: 3 g/dL (ref 1.5–4.5)
Glucose: 103 mg/dL — ABNORMAL HIGH (ref 70–99)
Potassium: 3.9 mmol/L (ref 3.5–5.2)
Sodium: 136 mmol/L (ref 134–144)
Total Protein: 6.9 g/dL (ref 6.0–8.5)
eGFR: 128 mL/min/{1.73_m2} (ref >59)

## 2022-06-23 LAB — CBC/D/PLT+RPR+RH+ABO+RUBIGG...
Antibody Screen: NEGATIVE
Basophils Absolute: 0 10*3/uL (ref 0.0–0.2)
Basos: 0 %
EOS (ABSOLUTE): 0 10*3/uL (ref 0.0–0.4)
Eos: 0 %
HCV Ab: NONREACTIVE
HIV Screen 4th Generation wRfx: NONREACTIVE
Hematocrit: 29.9 % — ABNORMAL LOW (ref 34.0–46.6)
Hemoglobin: 10 g/dL — ABNORMAL LOW (ref 11.1–15.9)
Hepatitis B Surface Ag: NEGATIVE
Immature Grans (Abs): 0 10*3/uL (ref 0.0–0.1)
Immature Granulocytes: 0 %
Lymphocytes Absolute: 2.4 10*3/uL (ref 0.7–3.1)
Lymphs: 23 %
MCH: 29.9 pg (ref 26.6–33.0)
MCHC: 33.4 g/dL (ref 31.5–35.7)
MCV: 90 fL (ref 79–97)
Monocytes Absolute: 0.5 10*3/uL (ref 0.1–0.9)
Monocytes: 5 %
Neutrophils Absolute: 7.6 10*3/uL — ABNORMAL HIGH (ref 1.4–7.0)
Neutrophils: 72 %
Platelets: 223 10*3/uL (ref 150–450)
RBC: 3.34 x10E6/uL — ABNORMAL LOW (ref 3.77–5.28)
RDW: 12.5 % (ref 11.7–15.4)
RPR Ser Ql: NONREACTIVE
Rh Factor: POSITIVE
Rubella Antibodies, IGG: 1.16 index (ref >0.99)
WBC: 10.6 10*3/uL (ref 3.4–10.8)

## 2022-06-23 LAB — AFP, SERUM, OPEN SPINA BIFIDA
AFP MoM: 0.96
AFP Value: 52.7 ng/mL
Gest. Age on Collection Date: 19.9 weeks
Maternal Age At EDD: 31.2 yr
OSBR Risk 1 IN: 10000
Test Results:: NEGATIVE
Weight: 184 [lb_av]

## 2022-06-23 LAB — URINE CULTURE, OB REFLEX

## 2022-06-23 LAB — HEMOGLOBIN A1C
Est. average glucose Bld gHb Est-mCnc: 97 mg/dL
Hgb A1c MFr Bld: 5 % (ref 4.8–5.6)

## 2022-06-23 LAB — PROTEIN / CREATININE RATIO, URINE
Creatinine, Urine: 174.4 mg/dL
Protein, Ur: 18.6 mg/dL
Protein/Creat Ratio: 107 mg/g creat (ref 0–200)

## 2022-06-23 LAB — CULTURE, OB URINE

## 2022-06-23 MED ORDER — NITROFURANTOIN MONOHYD MACRO 100 MG PO CAPS: 100.0000 mg | ORAL_CAPSULE | Freq: Two times a day (BID) | ORAL | 0 refills | Status: DC

## 2022-06-23 NOTE — Addendum Note (Signed)
Addended by: Luvenia Redden on: 06/23/2022 04:56 PM   Modules accepted: Orders

## 2022-06-26 LAB — PANORAMA PRENATAL TEST FULL PANEL:PANORAMA TEST PLUS 5 ADDITIONAL MICRODELETIONS: FETAL FRACTION: 21.4

## 2022-06-30 LAB — HORIZON CUSTOM: REPORT SUMMARY: NEGATIVE

## 2022-07-18 ENCOUNTER — Encounter: Payer: Medicaid Other | Admitting: Family Medicine

## 2022-07-21 ENCOUNTER — Encounter: Payer: Self-pay | Admitting: Obstetrics & Gynecology

## 2022-07-21 ENCOUNTER — Ambulatory Visit (INDEPENDENT_AMBULATORY_CARE_PROVIDER_SITE_OTHER): Payer: Medicaid Other | Admitting: Obstetrics & Gynecology

## 2022-07-21 ENCOUNTER — Other Ambulatory Visit: Payer: Self-pay

## 2022-07-21 VITALS — BP 127/64 | HR 83 | Wt 189.0 lb

## 2022-07-21 DIAGNOSIS — Z3480 Encounter for supervision of other normal pregnancy, unspecified trimester: Secondary | ICD-10-CM

## 2022-07-21 DIAGNOSIS — Z8759 Personal history of other complications of pregnancy, childbirth and the puerperium: Secondary | ICD-10-CM

## 2022-07-21 DIAGNOSIS — Z23 Encounter for immunization: Secondary | ICD-10-CM | POA: Diagnosis not present

## 2022-07-21 DIAGNOSIS — Z3A24 24 weeks gestation of pregnancy: Secondary | ICD-10-CM

## 2022-07-21 NOTE — Progress Notes (Signed)
   PRENATAL VISIT NOTE  Subjective:  Natalie Riggs is a 31 y.o. [phone] at [redacted]w[redacted]d being seen today for ongoing prenatal care.  She is currently monitored for the following issues for this low-risk pregnancy and has Unwanted fertility; Supervision of other normal pregnancy, antepartum; and History of gestational hypertension on their problem list.  Patient reports no complaints.  Contractions: Not present. Vag. Bleeding: None.  Movement: Present. Denies leaking of fluid.   The following portions of the patient's history were reviewed and updated as appropriate: allergies, current medications, past family history, past medical history, past social history, past surgical history and problem list.   Objective:   Vitals:   07/21/22 1112  BP: 127/64  Pulse: 83  Weight: 189 lb (85.7 kg)    Fetal Status: Fetal Heart Rate (bpm): 145   Movement: Present     General:  Alert, oriented and cooperative. Patient is in no acute distress.  Skin: Skin is warm and dry. No rash noted.   Cardiovascular: Normal heart rate noted  Respiratory: Normal respiratory effort, no problems with respiration noted  Abdomen: Soft, gravid, appropriate for gestational age.  Pain/Pressure: Absent     Pelvic: Cervical exam deferred        Extremities: Normal range of motion.     Mental Status: Normal mood and affect. Normal behavior. Normal judgment and thought content.   Assessment and Plan:  Pregnancy: [id number] at [redacted]w[redacted]d 1. History of gestational hypertension Stable BP. On ldASA.  2. Flu vaccine need - Flu Vaccine QUAD 36+ mos IM (Fluarix, Quad PF) given today.  3. [redacted] weeks gestation of pregnancy 4. Supervision of other normal pregnancy, antepartum No other concerns. Preterm labor symptoms and general obstetric precautions including but not limited to vaginal bleeding, contractions, leaking of fluid and fetal movement were reviewed in detail with the patient. Please refer to After Visit Summary for other  counseling recommendations.   Return in about 4 weeks (around 08/18/2022) for 2 hr GTT, 3rd trimester labs, TDap, OFFICE OB VISIT (MD or APP).  Future Appointments  Date Time Provider [place]  08/19/2022  8:15 AM Griffin Basil, MD [facility]  08/19/2022  8:50 AM [facility] [place]    Verita Schneiders, MD

## 2022-07-21 NOTE — Patient Instructions (Signed)
Oral Glucose Tolerance Test During Pregnancy Why am I having this test? The oral glucose tolerance test (GTT) is done to check how your body processes blood sugar (glucose). This is one of several tests used to diagnose diabetes that develops during pregnancy (gestational diabetes mellitus). Gestational diabetes is a short-term form of diabetes that some women develop while they are pregnant. It usually occurs during the second or third trimester of pregnancy and goes away after delivery. Testing, or screening, for gestational diabetes usually occurs around 28 of pregnancy. This test may also be needed earlier if: You have a history of gestational diabetes. There is a history of giving birth to very large babies or of losing pregnancies (having stillbirths). You have signs and symptoms of diabetes, such as: Changes in your eyesight. Tingling or numbness in your hands or feet. Changes in hunger, thirst, and urination, and these are not explained by your pregnancy. What is being tested? This test measures the amount of glucose in your blood at different times during a period of 2 hours. This shows how well your body can process glucose.  You will have three separate blood draws. What kind of sample is taken?  Blood samples are required for this test. They are usually collected by inserting a needle into a blood vessel. How do I prepare for this test? For 3 days before your test, eat normally. Have plenty of carbohydrate-rich foods. You will be asked not to eat or drink anything other than water (to fast) starting 8-10 hours before the test. Tell a health care provider about: All medicines you are taking, including vitamins, herbs, eye drops, creams, and over-the-counter medicines. Any blood disorders you have. Any surgeries you have had. Any medical conditions you have. What happens during the test? First, your blood glucose will be measured. This is referred to as your fasting blood glucose  because you fasted before the test. Then, you will drink a glucose solution that contains a certain amount of glucose. Your blood glucose will be measured again 1 and 2 hours after you drink the solution. This test takes about 2 hours to complete. You will need to stay at the testing location during this time. During the testing period: Do not eat or drink anything other than the glucose solution. Do not exercise. Do not use any products that contain nicotine or tobacco, such as cigarettes, e-cigarettes, and chewing tobacco. These can affect your test results. If you need help quitting, ask your health care provider. The testing procedure may vary among health care providers and hospitals. How are the results reported? Your results will be reported as milligrams of glucose per deciliter of blood (mg/dL) or millimoles per liter (mmol/L). There is more than one source for screening and diagnosis reference values used to diagnose gestational diabetes. Your health care provider will compare your results to normal values that were established after testing a large group of people (reference values). Reference values may vary among labs and hospitals. For this test, reference values are: Fasting: 92 mg/dL 1 hour: 180 mg/dL  2 hour: 153 mg/dL   What do the results mean? Results below the reference values are considered normal. If one or more of your blood glucose levels are at or above the reference values, you will be diagnosed with gestational diabetes.  Talk with your health care provider about what your results mean. Questions to ask your health care provider Ask your health care provider, or the department that is doing the test: When   will my results be ready? How will I get my results? What are my treatment options? What other tests do I need? What are my next steps? Summary The oral glucose tolerance test (GTT) is one of several tests used to diagnose diabetes that develops during pregnancy  (gestational diabetes mellitus). Gestational diabetes is a short-term form of diabetes that some women develop while they are pregnant. You may also have this test if you have any symptoms or risk factors for this type of diabetes. Talk with your health care provider about what your results mean. This information is not intended to replace advice given to you by your health care provider. Make sure you discuss any questions you have with your health care provider.  TDaP Vaccine Pregnancy Get the Whooping Cough Vaccine While You Are Pregnant (CDC)  It is important for women to get the whooping cough vaccine in the third trimester of each pregnancy. Vaccines are the best way to prevent this disease. There are 2 different whooping cough vaccines. Both vaccines combine protection against whooping cough, tetanus and diphtheria, but they are for different age groups: Tdap: for everyone 11 years or older, including pregnant women  DTaP: for children 2 months through 6 years of age  You need the whooping cough vaccine during each of your pregnancies The recommended time to get the shot is during your 27th through 36th week of pregnancy, preferably during the earlier part of this time period. The Centers for Disease Control and Prevention (CDC) recommends that pregnant women receive the whooping cough vaccine for adolescents and adults (called Tdap vaccine) during the third trimester of each pregnancy. The recommended time to get the shot is during your 27th through 36th week of pregnancy, preferably during the earlier part of this time period. This replaces the original recommendation that pregnant women get the vaccine only if they had not previously received it. The American College of Obstetricians and Gynecologists and the American College of Nurse-Midwives support this recommendation.  You should get the whooping cough vaccine while pregnant to pass protection to your baby frame support disabled  and/or not supported in this browser  Learn why Laura decided to get the whooping cough vaccine in her 3rd trimester of pregnancy and how her baby girl was born with some protection against the disease. Also available on YouTube. After receiving the whooping cough vaccine, your body will create protective antibodies (proteins produced by the body to fight off diseases) and pass some of them to your baby before birth. These antibodies provide your baby some short-term protection against whooping cough in early life. These antibodies can also protect your baby from some of the more serious complications that come along with whooping cough. Your protective antibodies are at their highest about 2 weeks after getting the vaccine, but it takes time to pass them to your baby. So the preferred time to get the whooping cough vaccine is early in your third trimester. The amount of whooping cough antibodies in your body decreases over time. That is why CDC recommends you get a whooping cough vaccine during each pregnancy. Doing so allows each of your babies to get the greatest number of protective antibodies from you. This means each of your babies will get the best protection possible against this disease.  Getting the whooping cough vaccine while pregnant is better than getting the vaccine after you give birth Whooping cough vaccination during pregnancy is ideal so your baby will have short-term protection as soon as he   is born. This early protection is important because your baby will not start getting his whooping cough vaccines until he is 2 months old. These first few months of life are when your baby is at greatest risk for catching whooping cough. This is also when he's at greatest risk for having severe, potentially life-threating complications from the infection. To avoid that gap in protection, it is best to get a whooping cough vaccine during pregnancy. You will then pass protection to your baby before he  is born. To continue protecting your baby, he should get whooping cough vaccines starting at 2 months old. You may never have gotten the Tdap vaccine before and did not get it during this pregnancy. If so, you should make sure to get the vaccine immediately after you give birth, before leaving the hospital or birthing center. It will take about 2 weeks before your body develops protection (antibodies) in response to the vaccine. Once you have protection from the vaccine, you are less likely to give whooping cough to your newborn while caring for him. But remember, your baby will still be at risk for catching whooping cough from others. A recent study looked to see how effective Tdap was at preventing whooping cough in babies whose mothers got the vaccine while pregnant or in the hospital after giving birth. The study found that getting Tdap between 27 through 36 weeks of pregnancy is 85% more effective at preventing whooping cough in babies younger than 2 months old. Blood tests cannot tell if you need a whooping cough vaccine There are no blood tests that can tell you if you have enough antibodies in your body to protect yourself or your baby against whooping cough. Even if you have been sick with whooping cough in the past or previously received the vaccine, you still should get the vaccine during each pregnancy. Breastfeeding may pass some protective antibodies onto your baby By breastfeeding, you may pass some antibodies you have made in response to the vaccine to your baby. When you get a whooping cough vaccine during your pregnancy, you will have antibodies in your breast milk that you can share with your baby as soon as your milk comes in. However, your baby will not get protective antibodies immediately if you wait to get the whooping cough vaccine until after delivering your baby. This is because it takes about 2 weeks for your body to create antibodies. Learn more about the health benefits of  breastfeeding.  

## 2022-08-18 ENCOUNTER — Other Ambulatory Visit: Payer: Self-pay | Admitting: *Deleted

## 2022-08-18 DIAGNOSIS — Z3480 Encounter for supervision of other normal pregnancy, unspecified trimester: Secondary | ICD-10-CM

## 2022-08-19 ENCOUNTER — Other Ambulatory Visit: Payer: Self-pay

## 2022-08-19 ENCOUNTER — Other Ambulatory Visit: Payer: Medicaid Other

## 2022-08-19 ENCOUNTER — Ambulatory Visit (INDEPENDENT_AMBULATORY_CARE_PROVIDER_SITE_OTHER): Payer: Medicaid Other | Admitting: Obstetrics and Gynecology

## 2022-08-19 VITALS — BP 136/69 | HR 85 | Wt 192.9 lb

## 2022-08-19 DIAGNOSIS — Z3480 Encounter for supervision of other normal pregnancy, unspecified trimester: Secondary | ICD-10-CM

## 2022-08-19 DIAGNOSIS — O219 Vomiting of pregnancy, unspecified: Secondary | ICD-10-CM

## 2022-08-19 DIAGNOSIS — Z3201 Encounter for pregnancy test, result positive: Secondary | ICD-10-CM

## 2022-08-19 DIAGNOSIS — Z3483 Encounter for supervision of other normal pregnancy, third trimester: Secondary | ICD-10-CM

## 2022-08-19 DIAGNOSIS — Z8759 Personal history of other complications of pregnancy, childbirth and the puerperium: Secondary | ICD-10-CM

## 2022-08-19 DIAGNOSIS — Z23 Encounter for immunization: Secondary | ICD-10-CM

## 2022-08-19 DIAGNOSIS — Z3A28 28 weeks gestation of pregnancy: Secondary | ICD-10-CM

## 2022-08-19 MED ORDER — ONDANSETRON 4 MG PO TBDP: 4.0000 mg | ORAL_TABLET | Freq: Three times a day (TID) | ORAL | 0 refills | Status: DC | PRN

## 2022-08-19 NOTE — Progress Notes (Signed)
   PRENATAL VISIT NOTE  Subjective:  Natalie Riggs is a 31 y.o. [id number] at [redacted]w[redacted]d being seen today for ongoing prenatal care.  She is currently monitored for the following issues for this low-risk pregnancy and has Nausea and vomiting in pregnancy; Unwanted fertility; Supervision of other normal pregnancy, antepartum; and History of gestational hypertension on their problem list.  Patient doing well with no acute concerns today. She reports  occasional nausea, usually at night .  Contractions: Not present. Vag. Bleeding: None.  Movement: Present. Denies leaking of fluid.    Tubal consent signed today. Patient counseled regarding bilateral tubal ligation. Reviewed that this is a permanent procedure and that she will not be able to have children after it is done. Reviewed risks of bilateral tubal ligation including infection, hemorrhage, damage to surrounding tissue and organs, risk of regret. Reviewed that bilateral tubal ligation is not 100% effective and she should take a pregnancy test if she believes for any reason she may be pregnant. Reviewed slightly increased risk of ectopic pregnancy and need to seek care if she becomes pregnant. She understands this is an elective procedure and again affirms her desire. Consent signed, also consents to blood transfusion if necessary.     The following portions of the patient's history were reviewed and updated as appropriate: allergies, current medications, past family history, past medical history, past social history, past surgical history and problem list. Problem list updated.  Objective:   Vitals:   08/19/22 0835  BP: 136/69  Pulse: 85  Weight: 192 lb 14.4 oz (87.5 kg)    Fetal Status: Fetal Heart Rate (bpm): 144 Fundal Height: 28 cm Movement: Present     General:  Alert, oriented and cooperative. Patient is in no acute distress.  Skin: Skin is warm and dry. No rash noted.   Cardiovascular: Normal heart rate noted  Respiratory: Normal  respiratory effort, no problems with respiration noted  Abdomen: Soft, gravid, appropriate for gestational age.  Pain/Pressure: Absent     Pelvic: Cervical exam deferred        Extremities: Normal range of motion.  Edema: Trace  Mental Status:  Normal mood and affect. Normal behavior. Normal judgment and thought content.   Assessment and Plan:  Pregnancy: [id number] at [redacted]w[redacted]d  1. Supervision of other normal pregnancy, antepartum Continue routine prenatal care  - Tdap vaccine greater than or equal to 7yo IM  2. [redacted] weeks gestation of pregnancy   3. History of gestational hypertension Monitor BP, high normal noted today  4. Need for Tdap vaccination  - Tdap vaccine greater than or equal to 7yo IM  5. Nausea and vomiting in pregnancy Rx for ODT - ondansetron (ZOFRAN-ODT) 4 MG disintegrating tablet; Take 1 tablet (4 mg total) by mouth every 8 (eight) hours as needed for nausea or vomiting.  Dispense: 20 tablet; Refill: 0   Preterm labor symptoms and general obstetric precautions including but not limited to vaginal bleeding, contractions, leaking of fluid and fetal movement were reviewed in detail with the patient.  Please refer to After Visit Summary for other counseling recommendations.   Return in about 2 weeks (around 09/02/2022) for ROB, in person.   Mariel Aloe, MD Faculty Attending [facility]

## 2022-08-20 LAB — CBC
Hematocrit: 30.4 % — ABNORMAL LOW (ref 34.0–46.6)
Hemoglobin: 10.1 g/dL — ABNORMAL LOW (ref 11.1–15.9)
MCH: 30.5 pg (ref 26.6–33.0)
MCHC: 33.2 g/dL (ref 31.5–35.7)
MCV: 92 fL (ref 79–97)
Platelets: 181 10*3/uL (ref 150–450)
RBC: 3.31 x10E6/uL — ABNORMAL LOW (ref 3.77–5.28)
RDW: 12.2 % (ref 11.7–15.4)
WBC: 9.2 10*3/uL (ref 3.4–10.8)

## 2022-08-20 LAB — RPR: RPR Ser Ql: NONREACTIVE

## 2022-08-20 LAB — HIV ANTIBODY (ROUTINE TESTING W REFLEX): HIV Screen 4th Generation wRfx: NONREACTIVE

## 2022-08-20 LAB — GLUCOSE TOLERANCE, 2 HOURS W/ 1HR
Glucose, 1 hour: 102 mg/dL (ref 70–179)
Glucose, 2 hour: 95 mg/dL (ref 70–152)
Glucose, Fasting: 77 mg/dL (ref 70–91)

## 2022-08-22 ENCOUNTER — Encounter: Payer: Self-pay | Admitting: *Deleted

## 2022-09-02 ENCOUNTER — Ambulatory Visit (INDEPENDENT_AMBULATORY_CARE_PROVIDER_SITE_OTHER): Payer: Medicaid Other | Admitting: Certified Nurse Midwife

## 2022-09-02 ENCOUNTER — Other Ambulatory Visit: Payer: Self-pay

## 2022-09-02 VITALS — BP 135/73 | HR 78 | Wt 192.4 lb

## 2022-09-02 DIAGNOSIS — O219 Vomiting of pregnancy, unspecified: Secondary | ICD-10-CM

## 2022-09-02 DIAGNOSIS — Z3493 Encounter for supervision of normal pregnancy, unspecified, third trimester: Secondary | ICD-10-CM

## 2022-09-02 DIAGNOSIS — Z8759 Personal history of other complications of pregnancy, childbirth and the puerperium: Secondary | ICD-10-CM

## 2022-09-02 DIAGNOSIS — Z3A30 30 weeks gestation of pregnancy: Secondary | ICD-10-CM

## 2022-09-02 DIAGNOSIS — M5430 Sciatica, unspecified side: Secondary | ICD-10-CM

## 2022-09-02 DIAGNOSIS — K219 Gastro-esophageal reflux disease without esophagitis: Secondary | ICD-10-CM

## 2022-09-02 DIAGNOSIS — K5903 Drug induced constipation: Secondary | ICD-10-CM

## 2022-09-02 DIAGNOSIS — O99613 Diseases of the digestive system complicating pregnancy, third trimester: Secondary | ICD-10-CM

## 2022-09-02 MED ORDER — OMEPRAZOLE MAGNESIUM 20 MG PO TBEC: 40.0000 mg | DELAYED_RELEASE_TABLET | Freq: Every day | ORAL | 2 refills | Status: DC

## 2022-09-02 MED ORDER — POLYETHYLENE GLYCOL 3350 17 G PO PACK: 17.0000 g | PACK | Freq: Every day | ORAL | 0 refills | Status: DC

## 2022-09-02 NOTE — Progress Notes (Unsigned)
   PRENATAL VISIT NOTE  Subjective:  Natalie Riggs is a 31 y.o. [id number] at [redacted]w[redacted]d being seen today for ongoing prenatal care.  She is currently monitored for the following issues for this low-risk pregnancy and has Nausea and vomiting in pregnancy; Unwanted fertility; Supervision of low-risk pregnancy; and History of gestational hypertension on their problem list.  Patient reports vomiting at night, not really nausea just wakes up and has to throw up. Zofran only partially helping but it is making her very constipated. Also having bad sciatic pain.  Contractions: Not present. Vag. Bleeding: None.  Movement: Present. Denies leaking of fluid.   The following portions of the patient's history were reviewed and updated as appropriate: allergies, current medications, past family history, past medical history, past social history, past surgical history and problem list.   Objective:   Vitals:   09/02/22 0915  BP: 135/73  Pulse: 78  Weight: 192 lb 6.4 oz (87.3 kg)   Fetal Status: Fetal Heart Rate (bpm): 145 Fundal Height: 30 cm Movement: Present     General:  Alert, oriented and cooperative. Patient is in no acute distress.  Skin: Skin is warm and dry. No rash noted.   Cardiovascular: Normal heart rate noted  Respiratory: Normal respiratory effort, no problems with respiration noted  Abdomen: Soft, gravid, appropriate for gestational age.  Pain/Pressure: Present     Pelvic: Cervical exam deferred        Extremities: Normal range of motion.  Edema: None  Mental Status: Normal mood and affect. Normal behavior. Normal judgment and thought content.   Assessment and Plan:  Pregnancy: [id number] at [redacted]w[redacted]d 1. Encounter for supervision of low-risk pregnancy in third trimester - Doing well overall, feeling regular and vigorous fetal movement   2. [redacted] weeks gestation of pregnancy - Doing well, feeling regular and vigorous fetal movement   3. Nausea and vomiting in pregnancy - Vomiting sounds much  more like reflux than pregnancy-induced nausea  4. History of gestational hypertension - BP stable  5. Drug-induced constipation - Related to zofran use, encouraged to try miralax in warm apple juice - polyethylene glycol (MIRALAX) 17 g packet; Take 17 g by mouth daily.  Dispense: 14 each; Refill: 0  6. Gastroesophageal reflux in pregnancy - omeprazole (PRILOSEC OTC) 20 MG tablet; Take 2 tablets (40 mg total) by mouth at bedtime.  Dispense: 30 tablet; Refill: 2  7. Sciatic leg pain - Gave referral to chiro, demonstrated stretches to relieve pain and gave maternity belt - Ambulatory referral to Physical Therapy  Preterm labor symptoms and general obstetric precautions including but not limited to vaginal bleeding, contractions, leaking of fluid and fetal movement were reviewed in detail with the patient. Please refer to After Visit Summary for other counseling recommendations.   Return in about 2 weeks (around 09/16/2022) for IN-PERSON, LOB.  Future Appointments  Date Time Provider Department Center  09/16/2022 10:55 AM Reva Bores, MD [facility]  09/30/2022  9:35 AM Bernerd Limbo, CNM [facility]   Bernerd Limbo, CNM

## 2022-09-02 NOTE — Patient Instructions (Signed)
Natalie Riggs w/ [facility] [address] [place], [place] [address] [phone] [email or web address]  

## 2022-09-14 ENCOUNTER — Other Ambulatory Visit: Payer: Self-pay

## 2022-09-14 ENCOUNTER — Ambulatory Visit (INDEPENDENT_AMBULATORY_CARE_PROVIDER_SITE_OTHER): Payer: Medicaid Other | Admitting: Certified Nurse Midwife

## 2022-09-14 VITALS — BP 124/71 | HR 93 | Wt 191.1 lb

## 2022-09-14 DIAGNOSIS — Z8759 Personal history of other complications of pregnancy, childbirth and the puerperium: Secondary | ICD-10-CM

## 2022-09-14 DIAGNOSIS — Z3A32 32 weeks gestation of pregnancy: Secondary | ICD-10-CM

## 2022-09-14 DIAGNOSIS — Z3493 Encounter for supervision of normal pregnancy, unspecified, third trimester: Secondary | ICD-10-CM

## 2022-09-14 NOTE — Progress Notes (Signed)
   PRENATAL VISIT NOTE  Subjective:  Natalie Riggs is a 31 y.o. [id number] at [redacted]w[redacted]d being seen today for ongoing prenatal care.  She is currently monitored for the following issues for this low-risk pregnancy and has Nausea and vomiting in pregnancy; Unwanted fertility; Supervision of low-risk pregnancy; and History of gestational hypertension on their problem list.  Patient reports  a lot of pelvic pressure helped some by maternity band. Reports that nighttime nausea/vomiting has been completely relieved and she is no longer constipated. .  Contractions: Irritability. Vag. Bleeding: None.  Movement: Present. Denies leaking of fluid.   The following portions of the patient's history were reviewed and updated as appropriate: allergies, current medications, past family history, past medical history, past social history, past surgical history and problem list.   Objective:   Vitals:   09/14/22 1416  BP: 124/71  Pulse: 93  Weight: 191 lb 1.6 oz (86.7 kg)   Fetal Status: Fetal Heart Rate (bpm): 146 Fundal Height: 32 cm Movement: Present     General:  Alert, oriented and cooperative. Patient is in no acute distress.  Skin: Skin is warm and dry. No rash noted.   Cardiovascular: Normal heart rate noted  Respiratory: Normal respiratory effort, no problems with respiration noted  Abdomen: Soft, gravid, appropriate for gestational age.  Pain/Pressure: Present     Pelvic: Cervical exam deferred        Extremities: Normal range of motion.  Edema: Trace  Mental Status: Normal mood and affect. Normal behavior. Normal judgment and thought content.   Assessment and Plan:  Pregnancy: [id number] at [redacted]w[redacted]d 1. Encounter for supervision of low-risk pregnancy in third trimester - Doing well, feeling regular and vigorous fetal movement   2. [redacted] weeks gestation of pregnancy - Routine OB care   3. History of gestational hypertension - BP normal today, no s/sx PEC  Preterm labor symptoms and general obstetric  precautions including but not limited to vaginal bleeding, contractions, leaking of fluid and fetal movement were reviewed in detail with the patient. Please refer to After Visit Summary for other counseling recommendations.   No follow-ups on file.  Future Appointments  Date Time Provider Department Center  09/30/2022  9:35 AM Osborne Oman [facility]  10/12/2022  1:15 PM Osborne O[facility]  10/19/2022 11:15 AM Bernerd Limbo, CNM [facility]  10/26/2022 10:15 AM Bernerd Limbo, CNM [facility]  11/02/2022  9:35 AM Bernerd Limbo, CNM [facility]    Bernerd Limbo, CNM

## 2022-09-16 ENCOUNTER — Encounter: Payer: Self-pay | Admitting: Family Medicine

## 2022-09-19 ENCOUNTER — Encounter: Payer: Self-pay | Admitting: *Deleted

## 2022-09-30 ENCOUNTER — Other Ambulatory Visit: Payer: Self-pay

## 2022-09-30 ENCOUNTER — Ambulatory Visit (INDEPENDENT_AMBULATORY_CARE_PROVIDER_SITE_OTHER): Payer: Medicaid Other | Admitting: Certified Nurse Midwife

## 2022-09-30 VITALS — BP 140/85 | HR 88 | Wt 198.3 lb

## 2022-09-30 DIAGNOSIS — Z8759 Personal history of other complications of pregnancy, childbirth and the puerperium: Secondary | ICD-10-CM

## 2022-09-30 DIAGNOSIS — Z3A34 34 weeks gestation of pregnancy: Secondary | ICD-10-CM

## 2022-09-30 DIAGNOSIS — O219 Vomiting of pregnancy, unspecified: Secondary | ICD-10-CM

## 2022-09-30 DIAGNOSIS — Z3493 Encounter for supervision of normal pregnancy, unspecified, third trimester: Secondary | ICD-10-CM

## 2022-09-30 NOTE — Progress Notes (Signed)
   PRENATAL VISIT NOTE  Subjective:  Natalie Riggs is a 31 y.o. [id number] at [redacted]w[redacted]d being seen today for ongoing prenatal care.  She is currently monitored for the following issues for this low-risk pregnancy and has Nausea and vomiting in pregnancy; Unwanted fertility; Supervision of low-risk pregnancy; and History of gestational hypertension on their problem list.  Patient reports no complaints.  Contractions: Irritability. Vag. Bleeding: None.  Movement: Present. Denies leaking of fluid.   The following portions of the patient's history were reviewed and updated as appropriate: allergies, current medications, past family history, past medical history, past social history, past surgical history and problem list.   Objective:   Vitals:   09/30/22 1021  BP: (!) 140/85  Pulse: 88  Weight: 198 lb 4.8 oz (89.9 kg)    Fetal Status: Fetal Heart Rate (bpm): 139 Fundal Height: 34 cm Movement: Present     General:  Alert, oriented and cooperative. Patient is in no acute distress.  Skin: Skin is warm and dry. No rash noted.   Cardiovascular: Normal heart rate noted  Respiratory: Normal respiratory effort, no problems with respiration noted  Abdomen: Soft, gravid, appropriate for gestational age.  Pain/Pressure: Present     Pelvic: Cervical exam deferred        Extremities: Normal range of motion.  Edema: None  Mental Status: Normal mood and affect. Normal behavior. Normal judgment and thought content.   Assessment and Plan:  Pregnancy: [id number] at [redacted]w[redacted]d 1. Encounter for supervision of low-risk pregnancy in third trimester - Doing well, feeling regular and vigorous fetal movement   2. [redacted] weeks gestation of pregnancy - Routine OB care including GBS swab at next visit  3. History of gestational hypertension - BP elevated today has another visit next week, PEC precautions given, will recheck next week.  4. Nausea and vomiting in pregnancy - Resolved  Preterm labor symptoms and general  obstetric precautions including but not limited to vaginal bleeding, contractions, leaking of fluid and fetal movement were reviewed in detail with the patient. Please refer to After Visit Summary for other counseling recommendations.   Return in about 2 weeks (around 10/14/2022) for IN-PERSON, LOB/GBS.  Future Appointments  Date Time Provider Department Center  10/12/2022  1:15 PM Osborne Oman [facility]  10/19/2022 11:15 AM Bernerd Limbo, CNM [facility]  10/26/2022 10:15 AM Bernerd Limbo, CNM [facility]  11/02/2022  9:35 AM Bernerd Limbo, CNM [facility]  11/08/2022  8:55 AM Armando Reichert, CNM [facility]  11/16/2022 10:15 AM [facility]  11/16/2022 11:15 AM Bernerd Limbo, CNM [facility]   Bernerd Limbo, CNM

## 2022-10-03 NOTE — L&D Delivery Note (Signed)
OB/GYN Faculty Practice Delivery Note  ALAIJAH GIBLER is a 32 y.o. [id number] s/p SVD at [redacted]w[redacted]d. She was admitted for IOL for gHTN.   ROM: 0h 71m with clear fluid GBS Status: Negative  Maximum Maternal Temperature: 98.2  Labor Progress: IOL started with loading dose of cytotec, then Pitocin and SROM. Progressed rapidly to complete with urge to push without further augmentation.  Delivery Date/Time: 10/20/22 at 1112 Delivery: Called to room and patient was complete and pushing. Head delivered LOA. No nuchal cord present. Shoulder and body delivered in usual fashion. Infant with spontaneous cry, placed on mother's abdomen, dried and stimulated. Cord clamped x 2 after 1-minute delay, and cut by FOB. Cord blood drawn. Placenta delivered spontaneously, intact, with 3-vessel cord. Fundus firm with massage and Pitocin. Labia, perineum, vagina, and cervix inspected, no lacerations noted.  Placenta: spontaneous, intact, sent to L&D for disposal Complications: None Lacerations: None EBL: 45ml Analgesia: epidural  Postpartum Planning [x]  transfer orders to MB [x]  discharge summary started & shared [x]  message to sent to schedule follow-up  [x]  lists updated [x]  vaccines UTD  Infant: Boy(yes)  APGARs 9/9  [place], [place], [place] Certified Nurse Midwife, [facility] for [employer], [place] 10/20/2022, 11:43 AM

## 2022-10-11 ENCOUNTER — Telehealth: Payer: Self-pay

## 2022-10-11 NOTE — Telephone Encounter (Signed)
Alert received through NCNotify system as follows: Patient identified as having high BP reading (140/85) on 09/30/2022  Chart review: No blood pressure follow up documented. Will return for visit tomorrow.  No action needed.  Annabell Howells, RN 10/11/2022  4:16 PM

## 2022-10-12 ENCOUNTER — Ambulatory Visit (INDEPENDENT_AMBULATORY_CARE_PROVIDER_SITE_OTHER): Payer: Medicaid Other | Admitting: Certified Nurse Midwife

## 2022-10-12 ENCOUNTER — Other Ambulatory Visit (HOSPITAL_COMMUNITY)
Admission: RE | Admit: 2022-10-12 | Discharge: 2022-10-12 | Disposition: A | Discharge status: Home / Self Care | Payer: Medicaid Other | Source: Ambulatory Visit | Attending: Family Medicine | Admitting: Family Medicine

## 2022-10-12 VITALS — BP 134/85 | HR 92 | Wt 192.0 lb

## 2022-10-12 DIAGNOSIS — R030 Elevated blood-pressure reading, without diagnosis of hypertension: Secondary | ICD-10-CM | POA: Diagnosis not present

## 2022-10-12 DIAGNOSIS — Z3A36 36 weeks gestation of pregnancy: Secondary | ICD-10-CM

## 2022-10-12 DIAGNOSIS — Z3493 Encounter for supervision of normal pregnancy, unspecified, third trimester: Secondary | ICD-10-CM

## 2022-10-12 DIAGNOSIS — Z8759 Personal history of other complications of pregnancy, childbirth and the puerperium: Secondary | ICD-10-CM

## 2022-10-12 LAB — COMPREHENSIVE METABOLIC PANEL
ALT: 11 IU/L (ref 0–32)
AST: 13 IU/L (ref 0–40)
Albumin/Globulin Ratio: 1.3 (ref 1.2–2.2)
Albumin: 3.9 g/dL (ref 3.9–4.9)
Alkaline Phosphatase: 133 IU/L — ABNORMAL HIGH (ref 44–121)
BUN/Creatinine Ratio: 19 (ref 9–23)
BUN: 10 mg/dL (ref 6–20)
Bilirubin Total: 0.4 mg/dL (ref 0.0–1.2)
CO2: 17 mmol/L — ABNORMAL LOW (ref 20–29)
Calcium: 9.2 mg/dL (ref 8.7–10.2)
Chloride: 104 mmol/L (ref 96–106)
Creatinine, Ser: 0.54 mg/dL — ABNORMAL LOW (ref 0.57–1.00)
Globulin, Total: 3.1 g/dL (ref 1.5–4.5)
Glucose: 76 mg/dL (ref 70–99)
Potassium: 3.8 mmol/L (ref 3.5–5.2)
Sodium: 133 mmol/L — ABNORMAL LOW (ref 134–144)
Total Protein: 7 g/dL (ref 6.0–8.5)
eGFR: 126 mL/min/{1.73_m2} (ref >59)

## 2022-10-12 LAB — CBC
Hematocrit: 33.8 % — ABNORMAL LOW (ref 34.0–46.6)
Hemoglobin: 11.5 g/dL (ref 11.1–15.9)
MCH: 30.7 pg (ref 26.6–33.0)
MCHC: 34 g/dL (ref 31.5–35.7)
MCV: 90 fL (ref 79–97)
Platelets: 179 10*3/uL (ref 150–450)
RBC: 3.75 x10E6/uL — ABNORMAL LOW (ref 3.77–5.28)
RDW: 13.2 % (ref 11.7–15.4)
WBC: 8.8 10*3/uL (ref 3.4–10.8)

## 2022-10-13 ENCOUNTER — Telehealth: Payer: Self-pay

## 2022-10-13 ENCOUNTER — Ambulatory Visit: Payer: Medicaid Other

## 2022-10-13 LAB — GC/CHLAMYDIA PROBE AMP (~~LOC~~) NOT AT ARMC
Chlamydia: NEGATIVE
Comment: NEGATIVE
Comment: NORMAL
Neisseria Gonorrhea: NEGATIVE

## 2022-10-13 NOTE — Telephone Encounter (Signed)
Called pt to review symptoms today after recent elevated BP in office. Pt reports feeling "muggy." Describes fatigue and nausea. Has been sleeping more today that normal. Offered BP check tomorrow recommended by Gilford Rile, CNM. Pt agreeable. If BP elevated tomorrow patient would meet criteria for gestational hypertension and earlier induction of labor would be needed.

## 2022-10-13 NOTE — Progress Notes (Signed)
PRENATAL VISIT NOTE  Subjective:  Natalie Riggs is a 32 y.o. [id number] at [redacted]w[redacted]d being seen today for ongoing prenatal care.  She is currently monitored for the following issues for this low-risk pregnancy and has Nausea and vomiting in pregnancy; Unwanted fertility; Supervision of low-risk pregnancy; and History of gestational hypertension on their problem list.  Patient reports  having weird sensations in her belly - kind of spastic, faster and shorter lived than hiccups. Feels a lot of anxiety about whether the baby is ok. Also concerned about her blood pressure since she developed hypertension at the end of her last pregnancy. Having occasional headaches and facial "pressure/muscle tics" but headaches go away on their own .  Contractions: Irritability. Vag. Bleeding: None.  Movement: Present. Denies leaking of fluid.   The following portions of the patient's history were reviewed and updated as appropriate: allergies, current medications, past family history, past medical history, past social history, past surgical history and problem list.   Objective:   Vitals:   10/12/22 1313 10/12/22 1335  BP: (!) 151/83 134/85  Pulse: 88 92  Weight: 192 lb (87.1 kg)     Fetal Status: Fetal Heart Rate (bpm): 145 Fundal Height: 36 cm Movement: Present     General:  Alert, oriented and cooperative. Patient is in no acute distress.  Skin: Skin is warm and dry. No rash noted.   Cardiovascular: Normal heart rate noted  Respiratory: Normal respiratory effort, no problems with respiration noted  Abdomen: Soft, gravid, appropriate for gestational age.  Pain/Pressure: Present     Pelvic: Cervical exam deferred        Extremities: Normal range of motion.  Edema: None  Mental Status: Normal mood and affect. Normal behavior. Normal judgment and thought content.   Assessment and Plan:  Pregnancy: [id number] at [redacted]w[redacted]d 1. Encounter for supervision of low-risk pregnancy in third trimester - Feeling vigorous  fetal movement, but also feeling anxious to the point of tears - Fetal nonstress test; Future - [facility] AMB BABYSCRIPTS SCHEDULE OPTIMIZATION  2. [redacted] weeks gestation of pregnancy - Routine OB care  - Culture, beta strep (group b only) - Protein / creatinine ratio, urine  3. History of gestational hypertension - BP elevated last week with no documented recheck, BP elevated today initially but normal on recheck. - Strong preeclampsia precautions reviewed  4. Elevated blood pressure reading - Reassurance given about today's recheck, pt upset when it was taken the first time. But given her history, will get baseline labs and NST today (with BPP if NST has any concern).  - Pt does not have Babyscripts, does have a BP cuff. Asked to check it daily and CMA helped her get enrolled/set up on her phone. - Labs ordered stat, will follow and manage accordingly. May need early BP check on Friday, and IOL if elevated again. - Comp Met (CMET) - CBC - Urine protein creatinine ratio - Fetal nonstress test; Future  Preterm labor symptoms and general obstetric precautions including but not limited to vaginal bleeding, contractions, leaking of fluid and fetal movement were reviewed in detail with the patient. Please refer to After Visit Summary for other counseling recommendations.   Return in about 1 week (around 10/19/2022) for IN-PERSON, LOB.  Future Appointments  Date Time Provider [place]  10/19/2022 11:15 AM Helaine Chess [facility]  10/26/2022 10:15 AM Helaine Chess [facility]  11/02/2022  9:35 AM Helaine Chess [facility]  11/08/2022  8:55  AM Tresea Mall, CNM [facility]  11/16/2022 10:15 AM [facility]  11/16/2022 11:15 AM Gabriel Carina, CNM [facility]    Gabriel Carina, CNM

## 2022-10-14 ENCOUNTER — Ambulatory Visit (INDEPENDENT_AMBULATORY_CARE_PROVIDER_SITE_OTHER): Payer: Medicaid Other | Admitting: *Deleted

## 2022-10-14 ENCOUNTER — Other Ambulatory Visit: Payer: Self-pay

## 2022-10-14 ENCOUNTER — Encounter: Payer: Medicaid Other | Admitting: Family Medicine

## 2022-10-14 VITALS — BP 126/70 | HR 94 | Ht 68.0 in | Wt 192.0 lb

## 2022-10-14 DIAGNOSIS — Z0130 Encounter for examination of blood pressure without abnormal findings: Secondary | ICD-10-CM

## 2022-10-14 NOTE — Progress Notes (Signed)
Pt presents for BP check - 126/70, P - 94. She reports mild H/A today - pain scale 5 and has not taken Tylenol.  She denies visual disturbances.  Lab results from 1/10 reviewed. CBC and CM24 are not consistent with pre-e, urine protein/creatinine ratio is still pending. Pt was advised to take Tylenol per package directions. If H/A becomes severe she should go to MAU for evaluation.  Pt has next Ob fu appt on 1/17. She voiced understanding of all information and instructions given.

## 2022-10-16 LAB — PROTEIN / CREATININE RATIO, URINE
Creatinine, Urine: 221.3 mg/dL
Protein, Ur: 38.3 mg/dL
Protein/Creat Ratio: 173 mg/g creat (ref 0–200)

## 2022-10-16 LAB — CULTURE, BETA STREP (GROUP B ONLY): Strep Gp B Culture: NEGATIVE

## 2022-10-17 NOTE — Progress Notes (Signed)
Alert received through NCNotify system as follows: Patient identified as not having a recommended prenatal visit. Patient is 36 weeks and 4 days pregnant. Most recent prenatal visit was on 09/30/2022 at [place].  Chart review: Patient's last visit was on 01/12 for a nurse visit BP check. Last prenatal appointment 01/10.  No action needed.  Martina Sinner, RN 10/17/2022  12:04 PM

## 2022-10-19 ENCOUNTER — Other Ambulatory Visit: Payer: Self-pay

## 2022-10-19 ENCOUNTER — Ambulatory Visit: Payer: Medicaid Other | Admitting: Certified Nurse Midwife

## 2022-10-19 ENCOUNTER — Other Ambulatory Visit: Payer: Self-pay | Admitting: Advanced Practice Midwife

## 2022-10-19 ENCOUNTER — Inpatient Hospital Stay (HOSPITAL_COMMUNITY)
Admission: AD | Admit: 2022-10-19 | Discharge: 2022-10-21 | DRG: 807 | Disposition: A | Discharge status: Home / Self Care | Payer: Medicaid Other | Attending: Family Medicine | Admitting: Family Medicine

## 2022-10-19 VITALS — BP 145/96 | HR 108 | Wt 194.6 lb

## 2022-10-19 DIAGNOSIS — Z7982 Long term (current) use of aspirin: Secondary | ICD-10-CM

## 2022-10-19 DIAGNOSIS — Z8759 Personal history of other complications of pregnancy, childbirth and the puerperium: Secondary | ICD-10-CM

## 2022-10-19 DIAGNOSIS — Z3A37 37 weeks gestation of pregnancy: Secondary | ICD-10-CM

## 2022-10-19 DIAGNOSIS — O134 Gestational [pregnancy-induced] hypertension without significant proteinuria, complicating childbirth: Secondary | ICD-10-CM | POA: Diagnosis present

## 2022-10-19 DIAGNOSIS — O133 Gestational [pregnancy-induced] hypertension without significant proteinuria, third trimester: Secondary | ICD-10-CM

## 2022-10-19 DIAGNOSIS — Z3493 Encounter for supervision of normal pregnancy, unspecified, third trimester: Secondary | ICD-10-CM

## 2022-10-19 DIAGNOSIS — Z87891 Personal history of nicotine dependence: Secondary | ICD-10-CM

## 2022-10-19 DIAGNOSIS — O139 Gestational [pregnancy-induced] hypertension without significant proteinuria, unspecified trimester: Secondary | ICD-10-CM | POA: Diagnosis present

## 2022-10-19 DIAGNOSIS — O219 Vomiting of pregnancy, unspecified: Secondary | ICD-10-CM

## 2022-10-19 LAB — COMPREHENSIVE METABOLIC PANEL
ALT: 15 IU/L (ref 0–32)
AST: 24 IU/L (ref 0–40)
Albumin/Globulin Ratio: 1.2 (ref 1.2–2.2)
Albumin: 4.1 g/dL (ref 3.9–4.9)
Alkaline Phosphatase: 149 IU/L — ABNORMAL HIGH (ref 44–121)
BUN/Creatinine Ratio: 15 (ref 9–23)
BUN: 8 mg/dL (ref 6–20)
Bilirubin Total: 0.4 mg/dL (ref 0.0–1.2)
CO2: 17 mmol/L — ABNORMAL LOW (ref 20–29)
Calcium: 9 mg/dL (ref 8.7–10.2)
Chloride: 103 mmol/L (ref 96–106)
Creatinine, Ser: 0.52 mg/dL — ABNORMAL LOW (ref 0.57–1.00)
Globulin, Total: 3.3 g/dL (ref 1.5–4.5)
Glucose: 85 mg/dL (ref 70–99)
Potassium: 4.4 mmol/L (ref 3.5–5.2)
Sodium: 130 mmol/L — ABNORMAL LOW (ref 134–144)
Total Protein: 7.4 g/dL (ref 6.0–8.5)
eGFR: 127 mL/min/{1.73_m2} (ref >59)

## 2022-10-19 LAB — CBC
HCT: 32.9 % — ABNORMAL LOW (ref 36.0–46.0)
Hematocrit: 36.5 % (ref 34.0–46.6)
Hemoglobin: 12.4 g/dL (ref 11.1–15.9)
Hemoglobin: 11.4 g/dL — ABNORMAL LOW (ref 12.0–15.0)
MCH: 30.8 pg (ref 26.6–33.0)
MCH: 30.9 pg (ref 26.0–34.0)
MCHC: 34 g/dL (ref 31.5–35.7)
MCHC: 34.7 g/dL (ref 30.0–36.0)
MCV: 91 fL (ref 79–97)
MCV: 89.2 fL (ref 80.0–100.0)
Platelets: 178 10*3/uL (ref 150–450)
Platelets: 182 10*3/uL (ref 150–400)
RBC: 4.02 x10E6/uL (ref 3.77–5.28)
RBC: 3.69 MIL/uL — ABNORMAL LOW (ref 3.87–5.11)
RDW: 13.1 % (ref 11.7–15.4)
RDW: 12 % (ref 11.5–15.5)
WBC: 7.3 10*3/uL (ref 3.4–10.8)
WBC: 9.5 10*3/uL (ref 4.0–10.5)
nRBC: 0 % (ref 0.0–0.2)

## 2022-10-19 MED ORDER — OXYTOCIN-SODIUM CHLORIDE 30-0.9 UT/500ML-% IV SOLN
2.5000 [IU]/h | INTRAVENOUS | Status: DC
  Filled 2022-10-19: qty 500

## 2022-10-19 MED ORDER — MISOPROSTOL 50MCG HALF TABLET
50.0000 ug | ORAL_TABLET | Freq: Once | ORAL | Status: AC
  Administered 2022-10-19: 50 ug via ORAL
  Filled 2022-10-19: qty 1

## 2022-10-19 MED ORDER — OXYCODONE-ACETAMINOPHEN 5-325 MG PO TABS: 1.0000 | ORAL_TABLET | ORAL | Status: DC | PRN

## 2022-10-19 MED ORDER — ACETAMINOPHEN 325 MG PO TABS: 650.0000 mg | ORAL_TABLET | ORAL | Status: DC | PRN

## 2022-10-19 MED ORDER — ONDANSETRON HCL 4 MG/2ML IJ SOLN: 4.0000 mg | Freq: Four times a day (QID) | INTRAMUSCULAR | Status: DC | PRN

## 2022-10-19 MED ORDER — OXYCODONE-ACETAMINOPHEN 5-325 MG PO TABS: 2.0000 | ORAL_TABLET | ORAL | Status: DC | PRN

## 2022-10-19 MED ORDER — TERBUTALINE SULFATE 1 MG/ML IJ SOLN: 0.2500 mg | Freq: Once | INTRAMUSCULAR | Status: DC | PRN

## 2022-10-19 MED ORDER — MISOPROSTOL 25 MCG QUARTER TABLET
25.0000 ug | ORAL_TABLET | Freq: Once | ORAL | Status: AC
  Administered 2022-10-19: 25 ug via VAGINAL
  Filled 2022-10-19: qty 1

## 2022-10-19 MED ORDER — SOD CITRATE-CITRIC ACID 500-334 MG/5ML PO SOLN: 30.0000 mL | ORAL | Status: DC | PRN

## 2022-10-19 MED ORDER — OXYTOCIN BOLUS FROM INFUSION
333.0000 mL | Freq: Once | INTRAVENOUS | Status: AC
  Administered 2022-10-20: 333 mL via INTRAVENOUS

## 2022-10-19 MED ORDER — FENTANYL CITRATE (PF) 100 MCG/2ML IJ SOLN
100.0000 ug | INTRAMUSCULAR | Status: DC | PRN
  Administered 2022-10-20 (×2): 100 ug via INTRAVENOUS
  Filled 2022-10-19 (×2): qty 2

## 2022-10-19 MED ORDER — LACTATED RINGERS IV SOLN: INTRAVENOUS | Status: DC

## 2022-10-19 MED ORDER — LIDOCAINE HCL (PF) 1 % IJ SOLN: 30.0000 mL | INTRAMUSCULAR | Status: DC | PRN

## 2022-10-19 MED ORDER — LACTATED RINGERS IV SOLN: 500.0000 mL | INTRAVENOUS | Status: DC | PRN

## 2022-10-19 NOTE — Progress Notes (Signed)
   PRENATAL VISIT NOTE  Subjective:  Natalie Riggs is a 32 y.o. [id number] at [redacted]w[redacted]d being seen today for ongoing prenatal care.  She is currently monitored for the following issues for this low-risk pregnancy and has Nausea and vomiting in pregnancy; Unwanted fertility; Supervision of low-risk pregnancy; and History of gestational hypertension on their problem list.  Patient reports  waking up with a pounding headache every day. Does not like taking medication so usually gets through her morning and then tries to lay down/put on a cold washcloth and sometimes it will go away. Feeling very "off", was seeing black spots this morning and overall feels shaky and not like herself. Is concerned her BP is going up, has been 130s-140s/80s-90s at home .  Contractions: Irritability. Vag. Bleeding: None.  Movement: (!) Decreased. Denies leaking of fluid.   The following portions of the patient's history were reviewed and updated as appropriate: allergies, current medications, past family history, past medical history, past social history, past surgical history and problem list.   Objective:   Vitals:   10/19/22 1128  BP: (!) 145/96  Pulse: (!) 108  Weight: 194 lb 9.6 oz (88.3 kg)   Fetal Status: Fetal Heart Rate (bpm): 150   Movement: (!) Decreased     General:  Alert, oriented and cooperative. Patient is in no acute distress.  Skin: Skin is warm and dry. No rash noted.   Cardiovascular: Normal heart rate noted  Respiratory: Normal respiratory effort, no problems with respiration noted  Abdomen: Soft, gravid, appropriate for gestational age.  Pain/Pressure: Absent     Pelvic: Cervical exam deferred        Extremities: Normal range of motion.  Edema: None  Mental Status: Normal mood and affect. Normal behavior. Normal judgment and thought content.   Assessment and Plan:  Pregnancy: K2H0623 at [redacted]w[redacted]d 1. Encounter for supervision of low-risk pregnancy in third trimester - Not feeling well and fetal  movement decreased   2. [redacted] weeks gestation of pregnancy - Routine OB care   3. Nausea and vomiting in pregnancy - Stable today  4. Gestational hypertension, third trimester - Met criteria last week but recheck of BP normal and so were labs - Pt strongly desires not to go to MAU as she wants to get her children packed and settled. Agreed to NST, repeat labs and to go to MAU once kids are settled with her mother (around 8:30-9pm). Discussed presentation with L&D Attending, Dr. Si Raider who agreed to direct admission but pt will need to go to MAU when she comes in this evening. [facility] teams aware. - CBC (normal) - Comp Met (CMET) (normal) - P:Cr will be collected at hospital admission  Term labor symptoms and general obstetric precautions including but not limited to vaginal bleeding, contractions, leaking of fluid and fetal movement were reviewed in detail with the patient. Please refer to After Visit Summary for other counseling recommendations.   Return in about 5 weeks (around 11/23/2022) for PP.  Future Appointments  Date Time Provider [place]  10/26/2022 10:15 AM Helaine Chess [facility]  11/02/2022  9:35 AM Gabriel Carina, CNM [facility]  11/08/2022  8:55 AM Tresea Mall, CNM [facility]  11/16/2022 10:15 AM [facility]  11/16/2022 11:15 AM Gabriel Carina, CNM [facility]    Gabriel Carina, CNM

## 2022-10-19 NOTE — H&P (Signed)
Natalie Riggs is a 32 y.o. [phone] female at [redacted]w[redacted]d by LMP c/w 19w u/s presenting for IOL due to dx of gHTN in office today with mild range BP elevation. Denies s/s pre-e.  Reports active fetal movement, contractions: irregular, every 2-7 minutes, vaginal bleeding: none, membranes: intact.  Initiated prenatal care at [facility] at 19 wks.   Most recent u/s : [redacted]w[redacted]d, EFW 96%, post placenta, nl anatomy.   This pregnancy complicated by: # gHTN (officially met dx @ 36.2wks with continued mild range elevation in office 10/19/22) # undesired fertility (30d papers signed 08/19/22)  Prenatal History/Complications:  # term SVB x 4 (2010, 2012, 2018, 2019) # gHTN w last preg  Past Medical History: Past Medical History:  Diagnosis Date   Miscarriage     Past Surgical History: Past Surgical History:  Procedure Laterality Date   DILATATION & CURETTAGE/HYSTEROSCOPY WITH MYOSURE      Obstetrical History: OB History     Gravida  7   Para  4   Term  4   Preterm  0   AB  2   Living  4      SAB  1   IAB  1   Ectopic  0   Multiple  0   Live Births  4           Social History: Social History   Socioeconomic History   Marital status: Single    Spouse name: Not on file   Number of children: Not on file   Years of education: Not on file   Highest education level: Not on file  Occupational History   Not on file  Tobacco Use   Smoking status: Former    Packs/day: 0.00    Types: Cigarettes   Smokeless tobacco: Never  Vaping Use   Vaping Use: Never used  Substance and Sexual Activity   Alcohol use: No   Drug use: No   Sexual activity: Yes    Birth control/protection: None  Other Topics Concern   Not on file  Social History Narrative   Not on file   Social Determinants of Health   Financial Resource Strain: Not on file  Food Insecurity: No Food Insecurity (10/19/2022)   Hunger Vital Sign    Worried About Running Out of Food in the Last Year: Never true    Ran  Out of Food in the Last Year: Never true  Transportation Needs: No Transportation Needs (10/19/2022)   PRAPARE - Hydrologist (Medical): No    Lack of Transportation (Non-Medical): No  Physical Activity: Not on file  Stress: Not on file  Social Connections: Not on file    Family History: Family History  Problem Relation Age of Onset   Asthma Brother     Allergies: No Known Allergies  Medications Prior to Admission  Medication Sig Dispense Refill Last Dose   aspirin EC 81 MG tablet Take 1 tablet (81 mg total) by mouth daily. Swallow whole. 30 tablet 12 Past Week   calcium carbonate (TUMS - DOSED IN MG ELEMENTAL CALCIUM) 500 MG chewable tablet Chew 1 tablet by mouth daily.   Past Week   omeprazole (PRILOSEC OTC) 20 MG tablet Take 2 tablets (40 mg total) by mouth at bedtime. 30 tablet 2 Past Week   Prenatal Vit-Fe Fumarate-FA (PREPLUS) 27-1 MG TABS Take 1 tablet by mouth daily. 30 tablet 11 Past Week   Blood Pressure Monitoring (BLOOD PRESSURE KIT) DEVI 1 Device  by Does not apply route as needed. (Patient not taking: Reported on 10/12/2022) 1 each 0    Misc. Devices (GOJJI WEIGHT SCALE) MISC 1 Device by Does not apply route as needed. (Patient not taking: Reported on 10/12/2022) 1 each 0    ondansetron (ZOFRAN-ODT) 4 MG disintegrating tablet Take 1 tablet (4 mg total) by mouth every 8 (eight) hours as needed for nausea or vomiting. (Patient not taking: Reported on 10/12/2022) 20 tablet 0    polyethylene glycol (MIRALAX) 17 g packet Take 17 g by mouth daily. 14 each 0     Review of Systems  Pertinent pos/neg as indicated in HPI  Blood pressure 137/83, pulse 93, temperature 97.8 F (36.6 C), temperature source Oral, resp. rate 18, height 5\' 7"  (1.702 m), weight 86.3 kg, last menstrual period 02/01/2022, SpO2 99 %, unknown if currently breastfeeding. General appearance: alert and cooperative Lungs: clear to auscultation bilaterally Heart: regular rate and  rhythm Abdomen: gravid, soft, non-tender, EFW by Leopold's approximately 6-7lbs Extremities: tr edema  Fetal monitoring: FHR: 120s bpm, variability: moderate,  Accelerations: Present,  decelerations:  Absent Uterine activity: Frequency: Every 2-4 minutes Dilation: 1 Effacement (%): 50 Station: -3 Exam by:: Doloris Hall, RN Presentation: cephalic   Prenatal labs: ABO, Rh: B/Positive/-- (09/18 1612) Antibody: Negative (09/18 1612) Rubella: 1.16 (09/18 1612) RPR: Non Reactive (11/17 0830)  HBsAg: Negative (09/18 1612)  HIV: Non Reactive (11/17 0830)  GBS: Negative/-- (01/10 1355)  2hr GTT: 77/103/83  Prenatal Transfer Tool  Maternal Diabetes: No Genetic Screening: Normal Maternal Ultrasounds/Referrals: Normal Fetal Ultrasounds or other Referrals:  None Maternal Substance Abuse:  No Significant Maternal Medications:  None Significant Maternal Lab Results: Group B Strep negative  Results for orders placed or performed during the hospital encounter of 10/19/22 (from the past 24 hour(s))  CBC   Collection Time: 10/19/22 10:49 PM  Result Value Ref Range   WBC 9.5 4.0 - 10.5 K/uL   RBC 3.69 (L) 3.87 - 5.11 MIL/uL   Hemoglobin 11.4 (L) 12.0 - 15.0 g/dL   HCT 32.9 (L) 36.0 - 46.0 %   MCV 89.2 80.0 - 100.0 fL   MCH 30.9 26.0 - 34.0 pg   MCHC 34.7 30.0 - 36.0 g/dL   RDW 12.0 11.5 - 15.5 %   Platelets 182 150 - 400 K/uL   nRBC 0.0 0.0 - 0.2 %  Results for orders placed or performed in visit on 10/19/22 (from the past 24 hour(s))  CBC   Collection Time: 10/19/22 12:16 PM  Result Value Ref Range   WBC 7.3 3.4 - 10.8 x10E3/uL   RBC 4.02 3.77 - 5.28 x10E6/uL   Hemoglobin 12.4 11.1 - 15.9 g/dL   Hematocrit 36.5 34.0 - 46.6 %   MCV 91 79 - 97 fL   MCH 30.8 26.6 - 33.0 pg   MCHC 34.0 31.5 - 35.7 g/dL   RDW 13.1 11.7 - 15.4 %   Platelets 178 150 - 450 x10E3/uL  Comp Met (CMET)   Collection Time: 10/19/22 12:16 PM  Result Value Ref Range   Glucose 85 70 - 99 mg/dL   BUN 8 6 -  20 mg/dL   Creatinine, Ser 0.52 (L) 0.57 - 1.00 mg/dL   eGFR 127 >59 mL/min/1.73   BUN/Creatinine Ratio 15 9 - 23   Sodium 130 (L) 134 - 144 mmol/L   Potassium 4.4 3.5 - 5.2 mmol/L   Chloride 103 96 - 106 mmol/L   CO2 17 (L) 20 - 29 mmol/L  Calcium 9.0 8.7 - 10.2 mg/dL   Total Protein 7.4 6.0 - 8.5 g/dL   Albumin 4.1 3.9 - 4.9 g/dL   Globulin, Total 3.3 1.5 - 4.5 g/dL   Albumin/Globulin Ratio 1.2 1.2 - 2.2   Bilirubin Total 0.4 0.0 - 1.2 mg/dL   Alkaline Phosphatase 149 (H) 44 - 121 IU/L   AST 24 0 - 40 IU/L   ALT 15 0 - 32 IU/L     Assessment:  [redacted]w[redacted]d SIUP  [id number]  gHTN  Cat 1 FHR  GBS Negative/-- (01/10 1355)  Plan:  Admit to L&D  IV pain meds/epidural prn active labor  Cytotec 50/25 for cervical ripening to start; will check in 4hrs to determine next plan  CMET & CBC neg for pre-e 1/17; will get P/C ratio  Anticipate vag delivery   Plans to breast and bottlefeed  Contraception: Depo (considering interval BTL; signed 30d papers 08/19/22)  Circumcision: yes  Arabella Merles CNM 10/20/2022, 12:35 AM

## 2022-10-19 NOTE — MAU Note (Signed)
.  Natalie Riggs is a 32 y.o. at [redacted]w[redacted]d here in MAU reporting: direct admit for GHTN, no PIH s/s. Pt report some dizziness all day. Pt denies DFM, VB , LOF, abnormal discharge, recent intercourse, and other complications in the pregnancy.   Pain score: 0/10 Vitals:   10/19/22 2153  BP: 137/83  Pulse: 93  Resp: 18  Temp: 97.8 F (36.6 C)  SpO2: 99%     FHT:150 Lab orders placed from triage:

## 2022-10-19 NOTE — MAU Provider Note (Signed)
Event Date/Time   First Provider Initiated Contact with Patient 10/19/22 2159      Natalie Riggs is a 31 y.o. [id number] patient who presents to MAU today for direct admission. She was seen in the office today and met criteria for gestational hypertension. She needed to go home and get things in order before coming in.   O BP 137/83 (BP Location: Right Arm)   Pulse 93   Temp 97.8 F (36.6 C) (Oral)   Resp 18   Ht 5\' 7"  (1.702 m)   Wt 86.3 kg   LMP 02/01/2022   SpO2 99%   BMI 29.81 kg/m  Physical Exam Vitals and nursing note reviewed.  Constitutional:      General: She is not in acute distress.    Appearance: She is well-developed.  HENT:     Head: Normocephalic.  Eyes:     Pupils: Pupils are equal, round, and reactive to light.  Cardiovascular:     Rate and Rhythm: Normal rate and regular rhythm.     Heart sounds: Normal heart sounds.  Pulmonary:     Effort: Pulmonary effort is normal. No respiratory distress.     Breath sounds: Normal breath sounds.  Abdominal:     General: Bowel sounds are normal. There is no distension.     Palpations: Abdomen is soft.     Tenderness: There is no abdominal tenderness.  Skin:    General: Skin is warm and dry.  Neurological:     Mental Status: She is alert and oriented to person, place, and time.  Psychiatric:        Mood and Affect: Mood normal.        Behavior: Behavior normal.        Thought Content: Thought content normal.        Judgment: Judgment normal.    A Medical screening exam complete -Gestational hypertension -[redacted] weeks gestation  P -Admit to labor and delivery -Report given to labor team  Wende Mott, [place] 10/19/2022 9:59 PM

## 2022-10-20 ENCOUNTER — Inpatient Hospital Stay (HOSPITAL_COMMUNITY): Payer: Medicaid Other | Admitting: Anesthesiology

## 2022-10-20 ENCOUNTER — Encounter (HOSPITAL_COMMUNITY): Payer: Self-pay | Admitting: Obstetrics & Gynecology

## 2022-10-20 DIAGNOSIS — Z3A37 37 weeks gestation of pregnancy: Secondary | ICD-10-CM

## 2022-10-20 DIAGNOSIS — O134 Gestational [pregnancy-induced] hypertension without significant proteinuria, complicating childbirth: Secondary | ICD-10-CM

## 2022-10-20 LAB — TYPE AND SCREEN
ABO/RH(D): B POS
Antibody Screen: NEGATIVE

## 2022-10-20 LAB — CBC
HCT: 33.1 % — ABNORMAL LOW (ref 36.0–46.0)
Hemoglobin: 11.5 g/dL — ABNORMAL LOW (ref 12.0–15.0)
MCH: 31 pg (ref 26.0–34.0)
MCHC: 34.7 g/dL (ref 30.0–36.0)
MCV: 89.2 fL (ref 80.0–100.0)
Platelets: 183 10*3/uL (ref 150–400)
RBC: 3.71 MIL/uL — ABNORMAL LOW (ref 3.87–5.11)
RDW: 12.1 % (ref 11.5–15.5)
WBC: 12.3 10*3/uL — ABNORMAL HIGH (ref 4.0–10.5)
nRBC: 0 % (ref 0.0–0.2)

## 2022-10-20 LAB — PROTEIN / CREATININE RATIO, URINE
Creatinine, Urine: 159 mg/dL
Protein Creatinine Ratio: 0.08 mg/mg{Cre} (ref 0.00–0.15)
Total Protein, Urine: 13 mg/dL

## 2022-10-20 LAB — RPR: RPR Ser Ql: NONREACTIVE

## 2022-10-20 MED ORDER — EPHEDRINE 5 MG/ML INJ: 10.0000 mg | INTRAVENOUS | Status: DC | PRN

## 2022-10-20 MED ORDER — LIDOCAINE HCL (PF) 1 % IJ SOLN
INTRAMUSCULAR | Status: DC | PRN
  Administered 2022-10-20: 2 mL via EPIDURAL
  Administered 2022-10-20: 10 mL via EPIDURAL

## 2022-10-20 MED ORDER — FENTANYL-BUPIVACAINE-NACL 0.5-0.125-0.9 MG/250ML-% EP SOLN
EPIDURAL | Status: DC | PRN
  Administered 2022-10-20: 12 mL/h via EPIDURAL

## 2022-10-20 MED ORDER — WITCH HAZEL-GLYCERIN EX PADS: 1.0000 | MEDICATED_PAD | CUTANEOUS | Status: DC | PRN

## 2022-10-20 MED ORDER — OXYTOCIN-SODIUM CHLORIDE 30-0.9 UT/500ML-% IV SOLN
1.0000 m[IU]/min | INTRAVENOUS | Status: DC
  Administered 2022-10-20: 2 m[IU]/min via INTRAVENOUS

## 2022-10-20 MED ORDER — OXYCODONE HCL 5 MG PO TABS: 10.0000 mg | ORAL_TABLET | ORAL | Status: DC | PRN

## 2022-10-20 MED ORDER — DIBUCAINE (PERIANAL) 1 % EX OINT: 1.0000 | TOPICAL_OINTMENT | CUTANEOUS | Status: DC | PRN

## 2022-10-20 MED ORDER — PRENATAL MULTIVITAMIN CH
1.0000 | ORAL_TABLET | Freq: Every day | ORAL | Status: DC
  Filled 2022-10-20: qty 1

## 2022-10-20 MED ORDER — SENNOSIDES-DOCUSATE SODIUM 8.6-50 MG PO TABS: 2.0000 | ORAL_TABLET | Freq: Every day | ORAL | Status: DC

## 2022-10-20 MED ORDER — PHENYLEPHRINE 80 MCG/ML (10ML) SYRINGE FOR IV PUSH (FOR BLOOD PRESSURE SUPPORT): 80.0000 ug | PREFILLED_SYRINGE | INTRAVENOUS | Status: DC | PRN

## 2022-10-20 MED ORDER — SIMETHICONE 80 MG PO CHEW: 80.0000 mg | CHEWABLE_TABLET | ORAL | Status: DC | PRN

## 2022-10-20 MED ORDER — ACETAMINOPHEN 325 MG PO TABS: 650.0000 mg | ORAL_TABLET | ORAL | Status: DC | PRN

## 2022-10-20 MED ORDER — LACTATED RINGERS IV SOLN
500.0000 mL | Freq: Once | INTRAVENOUS | Status: AC
  Administered 2022-10-20: 500 mL via INTRAVENOUS

## 2022-10-20 MED ORDER — DIPHENHYDRAMINE HCL 25 MG PO CAPS: 25.0000 mg | ORAL_CAPSULE | Freq: Four times a day (QID) | ORAL | Status: DC | PRN

## 2022-10-20 MED ORDER — TERBUTALINE SULFATE 1 MG/ML IJ SOLN: 0.2500 mg | Freq: Once | INTRAMUSCULAR | Status: DC | PRN

## 2022-10-20 MED ORDER — ONDANSETRON HCL 4 MG PO TABS: 4.0000 mg | ORAL_TABLET | ORAL | Status: DC | PRN

## 2022-10-20 MED ORDER — COCONUT OIL OIL: 1.0000 | TOPICAL_OIL | Status: DC | PRN

## 2022-10-20 MED ORDER — BENZOCAINE-MENTHOL 20-0.5 % EX AERO
1.0000 | INHALATION_SPRAY | CUTANEOUS | Status: DC | PRN
  Administered 2022-10-20: 1 via TOPICAL
  Filled 2022-10-20: qty 56

## 2022-10-20 MED ORDER — ONDANSETRON HCL 4 MG/2ML IJ SOLN: 4.0000 mg | INTRAMUSCULAR | Status: DC | PRN

## 2022-10-20 MED ORDER — FUROSEMIDE 20 MG PO TABS
10.0000 mg | ORAL_TABLET | Freq: Every day | ORAL | Status: DC
  Administered 2022-10-20 – 2022-10-21 (×2): 10 mg via ORAL
  Filled 2022-10-20 (×2): qty 1

## 2022-10-20 MED ORDER — TETANUS-DIPHTH-ACELL PERTUSSIS 5-2.5-18.5 LF-MCG/0.5 IM SUSY: 0.5000 mL | PREFILLED_SYRINGE | Freq: Once | INTRAMUSCULAR | Status: DC

## 2022-10-20 MED ORDER — FENTANYL-BUPIVACAINE-NACL 0.5-0.125-0.9 MG/250ML-% EP SOLN
12.0000 mL/h | EPIDURAL | Status: DC | PRN
  Filled 2022-10-20: qty 250

## 2022-10-20 MED ORDER — OXYCODONE HCL 5 MG PO TABS: 5.0000 mg | ORAL_TABLET | ORAL | Status: DC | PRN

## 2022-10-20 MED ORDER — DIPHENHYDRAMINE HCL 50 MG/ML IJ SOLN: 12.5000 mg | INTRAMUSCULAR | Status: DC | PRN

## 2022-10-20 MED ORDER — IBUPROFEN 600 MG PO TABS
600.0000 mg | ORAL_TABLET | Freq: Four times a day (QID) | ORAL | Status: DC
  Administered 2022-10-20 – 2022-10-21 (×3): 600 mg via ORAL
  Filled 2022-10-20 (×4): qty 1

## 2022-10-20 NOTE — Anesthesia Procedure Notes (Signed)
Epidural Patient location during procedure: OB Start time: 10/20/2022 9:46 AM End time: 10/20/2022 9:55 AM  Staffing Anesthesiologist: Pervis Hocking, DO Performed: anesthesiologist   Preanesthetic Checklist Completed: patient identified, IV checked, risks and benefits discussed, monitors and equipment checked, pre-op evaluation and timeout performed  Epidural Patient position: sitting Prep: DuraPrep and site prepped and draped Patient monitoring: continuous pulse ox, blood pressure, heart rate and cardiac monitor Approach: midline Location: L3-L4 Injection technique: LOR air  Needle:  Needle type: Tuohy  Needle gauge: 17 G Needle length: 9 cm Needle insertion depth: 7 cm Catheter type: closed end flexible Catheter size: 19 Gauge Catheter at skin depth: 12 cm Test dose: negative  Assessment Sensory level: T8 Events: blood not aspirated, no cerebrospinal fluid, injection not painful, no injection resistance, no paresthesia and negative IV test  Additional Notes Patient identified. Risks/Benefits/Options discussed with patient including but not limited to bleeding, infection, nerve damage, paralysis, failed block, incomplete pain control, headache, blood pressure changes, nausea, vomiting, reactions to medication both or allergic, itching and postpartum back pain. Confirmed with bedside nurse the patient's most recent platelet count. Confirmed with patient that they are not currently taking any anticoagulation, have any bleeding history or any family history of bleeding disorders. Patient expressed understanding and wished to proceed. All questions were answered. Sterile technique was used throughout the entire procedure. Please see nursing notes for vital signs. Test dose was given through epidural catheter and negative prior to continuing to dose epidural or start infusion. Warning signs of high block given to the patient including shortness of breath, tingling/numbness in  hands, complete motor block, or any concerning symptoms with instructions to call for help. Patient was given instructions on fall risk and not to get out of bed. All questions and concerns addressed with instructions to call with any issues or inadequate analgesia.  Reason for block:procedure for pain

## 2022-10-20 NOTE — Anesthesia Preprocedure Evaluation (Signed)
Anesthesia Evaluation  Patient identified by MRN, date of birth, ID band Patient awake    Reviewed: Allergy & Precautions, Patient's Chart, lab work & pertinent test results  Airway Mallampati: II  TM Distance: >3 FB Neck ROM: Full    Dental no notable dental hx.    Pulmonary neg pulmonary ROS, former smoker   Pulmonary exam normal breath sounds clear to auscultation       Cardiovascular hypertension (gHTN), Normal cardiovascular exam Rhythm:Regular Rate:Normal     Neuro/Psych negative neurological ROS  negative psych ROS   GI/Hepatic negative GI ROS, Neg liver ROS,,,  Endo/Other  negative endocrine ROS    Renal/GU negative Renal ROS  negative genitourinary   Musculoskeletal negative musculoskeletal ROS (+)    Abdominal   Peds negative pediatric ROS (+)  Hematology  (+) Blood dyscrasia, anemia Hb 11.5, plt 183   Anesthesia Other Findings   Reproductive/Obstetrics (+) Pregnancy G7P4 but no prior epidurals                             Anesthesia Physical Anesthesia Plan  ASA: 2  Anesthesia Plan: Epidural   Post-op Pain Management:    Induction:   PONV Risk Score and Plan: 2  Airway Management Planned: Natural Airway  Additional Equipment: None  Intra-op Plan:   Post-operative Plan:   Informed Consent: I have reviewed the patients History and Physical, chart, labs and discussed the procedure including the risks, benefits and alternatives for the proposed anesthesia with the patient or authorized representative who has indicated his/her understanding and acceptance.       Plan Discussed with:   Anesthesia Plan Comments:        Anesthesia Quick Evaluation

## 2022-10-20 NOTE — Discharge Summary (Addendum)
Postpartum Discharge Summary    Patient Name: Natalie Riggs DOB: 1991-04-19 MRN: [id number]  Date of admission: 10/19/2022 Delivery date:10/20/2022  Delivering provider: Gaylan Gerold R  Date of discharge: 10/21/2022  Admitting diagnosis: Gestational hypertension [O13.9] Intrauterine pregnancy: [redacted]w[redacted]d     Secondary diagnosis:  Active Problems:   Gestational hypertension  Additional problems: None    Discharge diagnosis: Term Pregnancy Delivered and Gestational Hypertension                                              Post partum procedures: none Augmentation: Pitocin and Cytotec Complications: None  Hospital course: Induction of Labor With Vaginal Delivery   32 y.o. yo [id number] at [redacted]w[redacted]d was admitted to the hospital 10/19/2022 for induction of labor.  Indication for induction: Gestational hypertension.  Patient had an labor course complicated by nothing. Membrane Rupture Time/Date: 10:38 AM ,10/20/2022   Delivery Method:Vaginal, Spontaneous  Episiotomy: None  Lacerations:  None  Details of delivery can be found in separate delivery note.  Patient had a postpartum course complicated by nothing. Patient is discharged home 10/21/22.  Newborn Data: Birth date:10/20/2022  Birth time:11:12 AM  Gender:Female  Living status:Living  Apgars:9 ,9  Weight:5 lb 12.8 oz (2.63 kg)   Magnesium Sulfate received: No BMZ received: No Rhophylac:N/A MMR:N/A T-DaP:Given prenatally Flu: No Transfusion:No  Physical exam  Vitals:   10/20/22 2210 10/20/22 2241 10/21/22 0611 10/21/22 1500  BP: 122/69 121/75 117/81 137/83  Pulse: 80 78 79 81  Resp: 18 18 18 18   Temp: 98.1 F (36.7 C) 98.2 F (36.8 C) 98.2 F (36.8 C) 97.9 F (36.6 C)  TempSrc: Oral Axillary Oral Oral  SpO2: 100%  100%   Weight:      Height:       General: alert, cooperative, and no distress Lochia: appropriate Uterine Fundus: firm Incision: N/A DVT Evaluation: No evidence of DVT seen on physical exam. Labs: Lab  Results  Component Value Date   WBC 12.3 (H) 10/20/2022   HGB 11.5 (L) 10/20/2022   HCT 33.1 (L) 10/20/2022   MCV 89.2 10/20/2022   PLT 183 10/20/2022      Latest Ref Rng & Units 10/19/2022   12:16 PM  CMP  Glucose 70 - 99 mg/dL 85   BUN 6 - 20 mg/dL 8   Creatinine 0.57 - 1.00 mg/dL 0.52   Sodium 134 - 144 mmol/L 130   Potassium 3.5 - 5.2 mmol/L 4.4   Chloride 96 - 106 mmol/L 103   CO2 20 - 29 mmol/L 17   Calcium 8.7 - 10.2 mg/dL 9.0   Total Protein 6.0 - 8.5 g/dL 7.4   Total Bilirubin 0.0 - 1.2 mg/dL 0.4   Alkaline Phos 44 - 121 IU/L 149   AST 0 - 40 IU/L 24   ALT 0 - 32 IU/L 15    Edinburgh Score:    06/22/2018    9:30 AM  Edinburgh Postnatal Depression Scale Screening Tool  I have been able to laugh and see the funny side of things. 0  I have looked forward with enjoyment to things. 0  I have blamed myself unnecessarily when things went wrong. 0  I have been anxious or worried for no good reason. 0  I have felt scared or panicky for no good reason. 0  Things have been getting on top of me. 0  I have been so unhappy that I have had difficulty sleeping. 0  I have felt sad or miserable. 0  I have been so unhappy that I have been crying. 0  The thought of harming myself has occurred to me. 0  Edinburgh Postnatal Depression Scale Total 0   After visit meds:  Allergies as of 10/21/2022   No Known Allergies      Medication List     STOP taking these medications    aspirin EC 81 MG tablet   omeprazole 20 MG tablet Commonly known as: PriLOSEC OTC   ondansetron 4 MG disintegrating tablet Commonly known as: ZOFRAN-ODT   polyethylene glycol 17 g packet Commonly known as: MiraLax   TUMS PO       TAKE these medications    acetaminophen 325 MG tablet Commonly known as: Tylenol Take 2 tablets (650 mg total) by mouth every 4 (four) hours as needed (for pain scale < 4).   furosemide 20 MG tablet Commonly known as: LASIX Take 1 tablet (20 mg total) by mouth  daily for 5 days. Start taking on: October 22, 2022   ibuprofen 600 MG tablet Commonly known as: ADVIL Take 1 tablet (600 mg total) by mouth every 6 (six) hours.   PrePLUS 27-1 MG Tabs Take 1 tablet by mouth daily.         Discharge home in stable condition Infant Feeding: Bottle and Breast Infant Disposition:home with mother Discharge instruction: per After Visit Summary and Postpartum booklet. Activity: Advance as tolerated. Pelvic rest for 6 weeks.  Diet: routine diet Future Appointments: Future Appointments  Date Time Provider [place]  12/02/2022 10:35 AM Gabriel Carina, CNM [facility]   Follow up Visit: Message sent to [facility] 10/21/22   Please schedule this patient for a In person postpartum visit in 6 weeks with the following provider:  Gaylan Gerold, CNM . Additional Postpartum F/U:BP check 1 week  Low risk pregnancy complicated by: HTN Delivery mode:  Vaginal, Spontaneous  Anticipated Birth Control:  PP Depo given  10/21/2022 Gabriel Carina, CNM

## 2022-10-20 NOTE — Lactation Note (Addendum)
This note was copied from a baby's chart. Lactation Consultation Note  Patient Name: Natalie Riggs JJHER'D Date: 10/20/2022 Reason for consult: Initial assessment;Late-preterm 34-36.6wks;1st time breastfeeding Age:32 hours LC entered room Birth Parent was on phone and eating dinner, LC mention she could return later when she finished eating but Birth Parent declined and want to continue session with [place]. Per Birth Parent infant was given 10 mls of formula at 1900 pm. LC discussed ETI feeding infant 8+ times within 24 hours, STS. Birth Parent feeding preference is breast and formula feeding. LC discussed continue to latch infant 1st and afterwards supplement infant with any EBM and then formula. LC demonstrated hand expression with breast model. Birth Parent open to using DEBP, LC was going to explain how to use DEBP but Birth Parent decided to finish her dinner and her phone conversation. Per Birth Parent wants LC to come back and set up DEBP, [place] explained that the RN will finish explaining how to use the DEBP. Birth Parent knows if she needs further latch assistance to call RN/LC. LC informed RN about assisting Birth Parent with how to use DEBP and that pump attachments are in the room,   Maternal Data Has patient been taught Hand Expression?: Yes Does the patient have breastfeeding experience prior to this delivery?: No  Feeding Mother's Current Feeding Choice: Breast Milk and Formula  LATCH Score                    Lactation Tools Discussed/Used Tools: Pump Breast pump type: Double-Electric Breast Pump Pump Education: Setup, frequency, and cleaning Reason for Pumping: ETI, less than 6 lbs Pumping frequency: Pump every 3 hours for 15 minutes on inital setting  Interventions Interventions: Breast feeding basics reviewed;Skin to skin;Education  Discharge Pump: Personal;DEBP (Per Birth Parent, she has DEBP at home.)  Consult Status Consult Status: Follow-up Date:  10/21/22 Follow-up type: In-patient    Eulis Canner 10/20/2022, 9:11 PM

## 2022-10-20 NOTE — Lactation Note (Signed)
This note was copied from a baby's chart. Lactation Consultation Note  Patient Name: Natalie Riggs [id number] Date: 10/20/2022 Reason for consult: L&D Initial assessment;Early term 37-38.6wks;Infant < 6lbs Age:32 hours  P5, Baby [redacted]w[redacted]d.  Assisted with latching baby.  Noted intermittent sucking bursts.  Lactation to follow up on MBU.   Maternal Data Does the patient have breastfeeding experience prior to this delivery?: Yes  Feeding Mother's Current Feeding Choice: Breast Milk  LATCH Score Latch: Grasps breast easily, tongue down, lips flanged, rhythmical sucking.  Audible Swallowing: A few with stimulation  Type of Nipple: Everted at rest and after stimulation  Comfort (Breast/Nipple): Soft / non-tender  Hold (Positioning): Assistance needed to correctly position infant at breast and maintain latch.  LATCH Score: 8  Interventions Interventions: Assisted with latch;Education  Consult Status Consult Status: Follow-up from L&D    Carlye Grippe  RN [facility] 10/20/2022, 12:35 PM

## 2022-10-20 NOTE — Progress Notes (Signed)
Patient ID: Natalie Riggs, female   DOB: 08/20/1991, 32 y.o.   MRN: [id number]  Labor Progress Note [name] is a 32 y.o. [id number] at [redacted]w[redacted]d presented for IOL for gHTN  S:  Pt now comfortable with epidural in place. FOB and MGM at bedside for support  O:  BP 139/76   Pulse (!) 114   Temp 97.9 F (36.6 C) (Oral)   Resp 18   Ht 5\' 7"  (1.702 m)   Wt 190 lb 4.8 oz (86.3 kg)   LMP 02/01/2022   SpO2 98%   BMI 29.81 kg/m  EFM: baseline 130 bpm/ moderate variability/ 15x15 accels/ no decels  Toco/IUPC: q35min SVE: Dilation: 4.5 Effacement (%): 90 Cervical Position: Middle Station: -2 Presentation: Vertex Exam by:: Cletus Gash, RN Pitocin: 6 mu/min  A/P: 32 y.o. [id number] 108w2d  1. Labor: Progressing well with pitocin titration, SROM with clear fluid just prior to CE. 2. FWB: Cat 1 with some early decelerations, will monitor closely 3. Pain: Well controlled with epidural in place 4. gHTN: BP and labs normal since admission, no further headaches/blurred vision  Will place IUPC at next exam if no major cervical change, encouraged position changes.  Anticipate SVD.  Gabriel Carina, CNM 10:54 AM

## 2022-10-20 NOTE — Progress Notes (Signed)
Patient ID: YAVONNE KISS, female   DOB: January 13, 1991, 32 y.o.   MRN: [id number]  Resting; feeling a little more crampy; s/p double dose of cytotec and then Pit started at 0500  BPs 128/61, 100/71, 127/77 FHR 120s, +accels, no decels, Cat 1 Ctx q 2-4 Cx deferred  IUP@37 .2wks gHTN (neg labs, mild range BPs) IOL process  -Plan to uptitrate Pit to achieve more active labor -Would like epidural placement, and then consider AROM -Anticipate vag del  Myrtis Ser [facility] 10/20/2022 7:59 AM

## 2022-10-21 ENCOUNTER — Other Ambulatory Visit (HOSPITAL_COMMUNITY): Payer: Self-pay

## 2022-10-21 MED ORDER — MEDROXYPROGESTERONE ACETATE 150 MG/ML IM SUSP
150.0000 mg | Freq: Once | INTRAMUSCULAR | Status: AC
  Administered 2022-10-21: 150 mg via INTRAMUSCULAR
  Filled 2022-10-21: qty 1

## 2022-10-21 MED ORDER — IBUPROFEN 600 MG PO TABS: 600.0000 mg | ORAL_TABLET | Freq: Four times a day (QID) | ORAL | 0 refills | Status: AC

## 2022-10-21 MED ORDER — FUROSEMIDE 20 MG PO TABS
20.0000 mg | ORAL_TABLET | Freq: Every day | ORAL | 0 refills | Status: AC
  Filled 2022-10-21: qty 5, 5d supply, fill #0

## 2022-10-21 MED ORDER — ACETAMINOPHEN 325 MG PO TABS: 650.0000 mg | ORAL_TABLET | ORAL | 0 refills | Status: AC | PRN

## 2022-10-21 NOTE — Lactation Note (Signed)
This note was copied from a baby's chart. Lactation Consultation Note  Patient Name: Natalie Riggs [id number] Date: 10/21/2022 Reason for consult: Follow-up assessment;Early term 37-38.6wks;Infant < 6lbs;Infant weight loss Age:32 hours  P5, [redacted]w[redacted]d. 6.8% weight loss in < 20 hours. Mother states she plans for now to only formula feed. Provided Neosure 22 kcal formula to help stabilize weight loss and provided crib card and reviewed volume guidelines, more if desired.  Goal today 18-21 ml.   Mother does have a DEBP at home and will let us know if she decided to start pumping here in the hospital.    Maternal Data Has patient been taught Hand Expression?: Yes  Feeding Mother's Current Feeding Choice: Formula Nipple Type: Slow - flow  Interventions Interventions: Education  Consult Status Consult Status: Complete Date: 10/21/22    Vivianne Master [facility] 10/21/2022, 7:52 AM

## 2022-10-21 NOTE — Plan of Care (Signed)
Adequate 

## 2022-10-21 NOTE — Anesthesia Postprocedure Evaluation (Signed)
Anesthesia Post Note  Patient: MABELINE VARAS  Procedure(s) Performed: AN AD HOC LABOR EPIDURAL     Patient location during evaluation: Mother Baby Anesthesia Type: Epidural Level of consciousness: awake and alert Pain management: pain level controlled Vital Signs Assessment: post-procedure vital signs reviewed and stable Respiratory status: spontaneous breathing, nonlabored ventilation and respiratory function stable Cardiovascular status: stable Postop Assessment: no headache, no backache and epidural receding Anesthetic complications: no   No notable events documented.  Last Vitals:  Vitals:   10/20/22 2241 10/21/22 0611  BP: 121/75 117/81  Pulse: 78 79  Resp: 18 18  Temp: 36.8 C 36.8 C  SpO2:  100%    Last Pain:  Vitals:   10/21/22 0847  TempSrc:   PainSc: 0-No pain   Pain Goal:                   Rozann Holts

## 2022-10-26 ENCOUNTER — Encounter: Payer: Self-pay | Admitting: Certified Nurse Midwife

## 2022-10-27 ENCOUNTER — Telehealth (HOSPITAL_COMMUNITY): Payer: Self-pay | Admitting: *Deleted

## 2022-10-27 NOTE — Telephone Encounter (Signed)
Attempted hospital discharge follow-up call. Left message for patient to return RN call with any questions or concerns. Erline Levine, RN, 10/27/22. [phone]

## 2022-11-02 ENCOUNTER — Encounter: Payer: Self-pay | Admitting: Certified Nurse Midwife

## 2022-11-03 ENCOUNTER — Telehealth: Payer: Self-pay

## 2022-11-03 NOTE — Telephone Encounter (Signed)
Alert received through [facility] system as follows:  Patient identified as having high BP reading (145/96) on 10/19/2022  Chart review: No blood pressure follow up documented. Discharge summary following vaginal delivery on 10/20/22 requests BP check 1 week PP; not scheduled.  Action needed: Called pt to schedule appt for BP check; VM left. MyChart message sent. Admin staff notified of scheduling error, will evaluate scheduling process.  Annabell Howells, RN 11/03/2022  2:39 PM

## 2022-11-08 ENCOUNTER — Encounter: Payer: Medicaid Other | Admitting: Advanced Practice Midwife

## 2022-11-09 ENCOUNTER — Ambulatory Visit: Payer: Medicaid Other

## 2022-11-16 ENCOUNTER — Encounter: Payer: Self-pay | Admitting: Certified Nurse Midwife

## 2022-11-16 ENCOUNTER — Other Ambulatory Visit: Payer: Self-pay

## 2022-12-02 ENCOUNTER — Ambulatory Visit: Payer: Medicaid Other | Admitting: Certified Nurse Midwife

## 2023-08-14 NOTE — Telephone Encounter (Signed)
Open in Error.
